# Patient Record
Sex: Female | Born: 1990 | Race: White | Hispanic: No | Marital: Single | Smoking: Never smoker
Health system: Southern US, Community
[De-identification: ages and names within clinical notes are randomized; demographics above are authoritative.]

## PROBLEM LIST (undated history)

## (undated) ENCOUNTER — Ambulatory Visit (HOSPITAL_COMMUNITY): Admission: EM | Payer: Self-pay | Source: Home / Self Care

## (undated) DIAGNOSIS — G8929 Other chronic pain: Secondary | ICD-10-CM

## (undated) DIAGNOSIS — M4310 Spondylolisthesis, site unspecified: Secondary | ICD-10-CM

## (undated) DIAGNOSIS — N83209 Unspecified ovarian cyst, unspecified side: Secondary | ICD-10-CM

## (undated) DIAGNOSIS — M5441 Lumbago with sciatica, right side: Secondary | ICD-10-CM

## (undated) HISTORY — DX: Lumbago with sciatica, right side: M54.41

## (undated) HISTORY — DX: Spondylolisthesis, site unspecified: M43.10

## (undated) HISTORY — DX: Other chronic pain: G89.29

## (undated) HISTORY — DX: Unspecified ovarian cyst, unspecified side: N83.209

## (undated) NOTE — *Deleted (*Deleted)
Integrated Behavioral Health Initial Visit  MRN: [id number] Name: Wanda Crane  Number of Integrated Behavioral Health Clinician visits:: {IBH Number of Visits:21014052} Session Start time: ***  Session End time: *** Total time: {IBH Total Time:21014050}  Type of Service: Integrated Behavioral Health- Individual/Family Interpretor:{yes [id number]} Interpretor Name and Language: ***   Warm Hand Off Completed.       SUBJECTIVE: Kasidi Shanker is a 71 y.o. female accompanied by {CHL AMB ACCOMPANIED YN:8295621308} Patient was referred by *** for ***. Patient reports the following symptoms/concerns: Shakiness, nervous, panic attacks, pt has extensive hx mh in family, irritability, no suicidal/homicidal ideations Duration of problem: ***; Severity of problem: {Mild/Moderate/Severe:20260}  OBJECTIVE: Mood: {BHH MOOD:22306} and Affect: {BHH AFFECT:22307} Risk of harm to self or others: {CHL AMB BH Suicide Current Mental Status:21022748}  LIFE CONTEXT: Family and Social: *** School/Work: *** Self-Care: *** Life Changes: ***  GOALS ADDRESSED: Patient will: 1. Reduce symptoms of: {IBH Symptoms:21014056} med management 2. Increase knowledge and/or ability of: {IBH Patient Tools:21014057} horse back riding spending time with family 3. Demonstrate ability to: {IBH Goals:21014053}  INTERVENTIONS: Interventions utilized: {IBH Interventions:21014054}  Standardized Assessments completed: {IBH Screening Tools:21014051}  ASSESSMENT: Patient currently experiencing ***.   Patient may benefit from ***.  PLAN: 1. Follow up with behavioral health clinician on : *** 2. Behavioral recommendations: *** 3. Referral(s): {IBH Referrals:21014055} 4. "From scale of 1-10, how likely are you to follow plan?": ***  Bridgett Larsson, LCSW

---

## 2010-10-24 ENCOUNTER — Emergency Department (HOSPITAL_COMMUNITY)
Admission: EM | Admit: 2010-10-24 | Discharge: 2010-10-24 | Disposition: A | Discharge status: Home / Self Care | Payer: Self-pay | Attending: Emergency Medicine | Admitting: Emergency Medicine

## 2010-10-24 DIAGNOSIS — J020 Streptococcal pharyngitis: Secondary | ICD-10-CM | POA: Insufficient documentation

## 2010-10-24 DIAGNOSIS — J3489 Other specified disorders of nose and nasal sinuses: Secondary | ICD-10-CM | POA: Insufficient documentation

## 2012-11-23 ENCOUNTER — Emergency Department (HOSPITAL_COMMUNITY)
Admission: EM | Admit: 2012-11-23 | Discharge: 2012-11-23 | Disposition: A | Discharge status: Home / Self Care | Payer: No Typology Code available for payment source | Attending: Emergency Medicine | Admitting: Emergency Medicine

## 2012-11-23 DIAGNOSIS — Z203 Contact with and (suspected) exposure to rabies: Secondary | ICD-10-CM

## 2012-11-23 DIAGNOSIS — S81852A Open bite, left lower leg, initial encounter: Secondary | ICD-10-CM

## 2012-11-23 DIAGNOSIS — Y929 Unspecified place or not applicable: Secondary | ICD-10-CM | POA: Insufficient documentation

## 2012-11-23 DIAGNOSIS — S81009A Unspecified open wound, unspecified knee, initial encounter: Secondary | ICD-10-CM | POA: Insufficient documentation

## 2012-11-23 DIAGNOSIS — S91009A Unspecified open wound, unspecified ankle, initial encounter: Secondary | ICD-10-CM | POA: Insufficient documentation

## 2012-11-23 DIAGNOSIS — W540XXA Bitten by dog, initial encounter: Secondary | ICD-10-CM | POA: Insufficient documentation

## 2012-11-23 DIAGNOSIS — Y939 Activity, unspecified: Secondary | ICD-10-CM | POA: Insufficient documentation

## 2012-11-23 MED ORDER — DOXYCYCLINE HYCLATE 100 MG PO TABS
100.0000 mg | ORAL_TABLET | Freq: Once | ORAL | Status: AC
  Administered 2012-11-23: 100 mg via ORAL
  Filled 2012-11-23: qty 1

## 2012-11-23 MED ORDER — TETANUS-DIPHTHERIA TOXOIDS TD 5-2 LFU IM INJ
0.5000 mL | INJECTION | Freq: Once | INTRAMUSCULAR | Status: AC
  Administered 2012-11-23: 0.5 mL via INTRAMUSCULAR
  Filled 2012-11-23 (×2): qty 0.5

## 2012-11-23 MED ORDER — RABIES VACCINE, PCEC IM SUSR
1.0000 mL | Freq: Once | INTRAMUSCULAR | Status: AC
  Administered 2012-11-23: 1 mL via INTRAMUSCULAR
  Filled 2012-11-23: qty 1

## 2012-11-23 MED ORDER — DOXYCYCLINE HYCLATE 100 MG PO TABS: 100.0000 mg | ORAL_TABLET | Freq: Two times a day (BID) | ORAL | Status: DC

## 2012-11-23 MED ORDER — RABIES IMMUNE GLOBULIN 150 UNIT/ML IM INJ
20.0000 [IU]/kg | INJECTION | Freq: Once | INTRAMUSCULAR | Status: AC
  Administered 2012-11-23: 1575 [IU] via INTRAMUSCULAR
  Filled 2012-11-23: qty 10.5

## 2012-11-23 MED ORDER — TRAMADOL HCL 50 MG PO TABS: 50.0000 mg | ORAL_TABLET | Freq: Four times a day (QID) | ORAL | Status: DC | PRN

## 2012-11-23 NOTE — ED Provider Notes (Signed)
History     CSN: [id number]  Arrival date & time 11/23/12  2010   First MD Initiated Contact with Patient 11/23/12 2051      Chief Complaint  Patient presents with  . Animal Bite    (Consider location/radiation/quality/duration/timing/severity/associated sxs/prior treatment) HPI Comments: Patient states she was bitten in the posterior left calf.  Last night by an unknown dog that has been found and is in custody of the animal control at this time is unknown.  If she has rabies immunization or not.  Patient states, that the wound bled last night, slightly.  Vision is tetanus status is unknown  Patient is a 22 y.o. female presenting with animal bite. The history is provided by the patient.  Animal Bite  The incident occurred yesterday. There is an injury to the left lower leg. The pain is mild. It is unlikely that a foreign body is present.    No past medical history on file.  No past surgical history on file.  No family history on file.  History  Substance Use Topics  . Smoking status: Not on file  . Smokeless tobacco: Not on file  . Alcohol Use: Not on file    OB History   No data available      Review of Systems  Constitutional: Negative for fever and chills.  Skin: Positive for wound.  All other systems reviewed and are negative.    Allergies  Penicillins  Home Medications   Current Outpatient Rx  Name  Route  Sig  Dispense  Refill  . doxycycline (VIBRA-TABS) 100 MG tablet   Oral   Take 1 tablet (100 mg total) by mouth 2 (two) times daily.   13 tablet   0   . traMADol (ULTRAM) 50 MG tablet   Oral   Take 1 tablet (50 mg total) by mouth every 6 (six) hours as needed for pain.   30 tablet   0     BP 130/67  Pulse 93  Temp(Src) 98.6 F (37 C) (Oral)  Resp 18  Wt 176 lb 3 oz (79.918 kg)  SpO2 100%  Physical Exam  Nursing note and vitals reviewed. Constitutional: She appears well-developed and well-nourished.  Obese  HENT:  Head:  Normocephalic.  Eyes: Pupils are equal, round, and reactive to light.  Neck: Normal range of motion.  Cardiovascular: Normal rate.   Pulmonary/Chest: Effort normal.  Musculoskeletal: She exhibits no edema and no tenderness.  Skin: Skin is warm. No erythema.  There is a bite mark to the posterior left calf, which appears to be more abrasions.  No punctures are visible.  There is no surrounding bruising    ED Course  Procedures (including critical care time)  Labs Reviewed - No data to display No results found.   1. Animal bite of lower leg, left, initial encounter   2. Rabies exposure       MDM   Patient will be updated on her tetanus rabies series.  Will be started.  She will also be started on doxycycline do, to her penicillin allergy        Arman Filter, NP 11/23/12 2149

## 2012-11-23 NOTE — ED Notes (Signed)
Pt c/o dog bite on posterior left calf.

## 2012-11-23 NOTE — ED Notes (Signed)
Stressed importance of follow up with [facility] re next vaccine doses. Pt voiced understanding. Copies of schedules faxed to pharmacy and [facility]

## 2012-11-23 NOTE — ED Notes (Addendum)
Pt c/o dog bite in left calf last night. Area appears to be mild abrasion. Mild redness noted, no swelling noted. Skin is not hot to touch. Pt states "I feel like it got a fever in it and I been vomiting and shaking sometimes." Pt states dog is unknown to her.

## 2012-11-26 ENCOUNTER — Emergency Department (HOSPITAL_COMMUNITY)
Admission: EM | Admit: 2012-11-26 | Discharge: 2012-11-26 | Disposition: A | Discharge status: Home / Self Care | Payer: Self-pay | Source: Home / Self Care

## 2012-11-26 ENCOUNTER — Encounter (HOSPITAL_COMMUNITY): Payer: Self-pay | Admitting: *Deleted

## 2012-11-26 MED ORDER — RABIES VACCINE, PCEC IM SUSR
INTRAMUSCULAR | Status: AC
  Filled 2012-11-26: qty 1

## 2012-11-26 MED ORDER — RABIES VACCINE, PCEC IM SUSR
1.0000 mL | Freq: Once | INTRAMUSCULAR | Status: AC
  Administered 2012-11-26: 1 mL via INTRAMUSCULAR

## 2012-11-26 NOTE — ED Notes (Signed)
Pt  Here  For  Day  3   Rabies  Injection   Voices  No  Complaints  Other than the  Bite  itcing       She  Is  Taking  Her  meds

## 2012-11-26 NOTE — ED Provider Notes (Signed)
Medical screening examination/treatment/procedure(s) were performed by non-physician practitioner and as supervising physician I was immediately available for consultation/collaboration.   Kery Haltiwanger E Inga Noller, MD 11/26/12 0932 

## 2012-11-30 ENCOUNTER — Emergency Department (INDEPENDENT_AMBULATORY_CARE_PROVIDER_SITE_OTHER)
Admission: EM | Admit: 2012-11-30 | Discharge: 2012-11-30 | Disposition: A | Discharge status: Home / Self Care | Payer: Self-pay | Source: Home / Self Care

## 2012-11-30 ENCOUNTER — Encounter (HOSPITAL_COMMUNITY): Payer: Self-pay | Admitting: Emergency Medicine

## 2012-11-30 DIAGNOSIS — Z203 Contact with and (suspected) exposure to rabies: Secondary | ICD-10-CM

## 2012-11-30 MED ORDER — RABIES VACCINE, PCEC IM SUSR
INTRAMUSCULAR | Status: AC
  Filled 2012-11-30: qty 1

## 2012-11-30 MED ORDER — RABIES VACCINE, PCEC IM SUSR
1.0000 mL | Freq: Once | INTRAMUSCULAR | Status: AC
  Administered 2012-11-30: 1 mL via INTRAMUSCULAR

## 2012-11-30 NOTE — ED Notes (Signed)
Pt states having a hard time sleeping due dog attack. Pt was informed of adult clinic and states that she will have a visit set up for primary care.  Voices no other concerns.

## 2012-12-07 ENCOUNTER — Encounter (HOSPITAL_COMMUNITY): Payer: Self-pay | Admitting: Emergency Medicine

## 2012-12-07 ENCOUNTER — Emergency Department (INDEPENDENT_AMBULATORY_CARE_PROVIDER_SITE_OTHER)
Admission: EM | Admit: 2012-12-07 | Discharge: 2012-12-07 | Disposition: A | Discharge status: Home / Self Care | Payer: Self-pay | Source: Home / Self Care

## 2012-12-07 DIAGNOSIS — Z203 Contact with and (suspected) exposure to rabies: Secondary | ICD-10-CM

## 2012-12-07 MED ORDER — RABIES VACCINE, PCEC IM SUSR
1.0000 mL | Freq: Once | INTRAMUSCULAR | Status: AC
  Administered 2012-12-07: 1 mL via INTRAMUSCULAR

## 2012-12-07 MED ORDER — RABIES VACCINE, PCEC IM SUSR
INTRAMUSCULAR | Status: AC
  Filled 2012-12-07: qty 1

## 2012-12-07 NOTE — ED Notes (Signed)
Patient in department for final rabies injection.

## 2013-11-07 ENCOUNTER — Encounter (HOSPITAL_COMMUNITY): Payer: Self-pay | Admitting: Emergency Medicine

## 2013-11-07 ENCOUNTER — Emergency Department (HOSPITAL_COMMUNITY)
Admission: EM | Admit: 2013-11-07 | Discharge: 2013-11-07 | Disposition: A | Discharge status: Home / Self Care | Payer: No Typology Code available for payment source | Attending: Emergency Medicine | Admitting: Emergency Medicine

## 2013-11-07 DIAGNOSIS — G8929 Other chronic pain: Secondary | ICD-10-CM | POA: Insufficient documentation

## 2013-11-07 DIAGNOSIS — J302 Other seasonal allergic rhinitis: Secondary | ICD-10-CM

## 2013-11-07 DIAGNOSIS — M549 Dorsalgia, unspecified: Secondary | ICD-10-CM

## 2013-11-07 DIAGNOSIS — M545 Low back pain, unspecified: Secondary | ICD-10-CM | POA: Insufficient documentation

## 2013-11-07 DIAGNOSIS — J309 Allergic rhinitis, unspecified: Secondary | ICD-10-CM | POA: Insufficient documentation

## 2013-11-07 DIAGNOSIS — Z880 Allergy status to penicillin: Secondary | ICD-10-CM | POA: Insufficient documentation

## 2013-11-07 LAB — RAPID STREP SCREEN (MED CTR MEBANE ONLY): Streptococcus, Group A Screen (Direct): NEGATIVE

## 2013-11-07 MED ORDER — NAPROXEN 500 MG PO TABS: 500.0000 mg | ORAL_TABLET | Freq: Two times a day (BID) | ORAL | Status: DC

## 2013-11-07 MED ORDER — LORATADINE-PSEUDOEPHEDRINE ER 5-120 MG PO TB12: 1.0000 | ORAL_TABLET | Freq: Two times a day (BID) | ORAL | Status: DC

## 2013-11-07 NOTE — ED Notes (Signed)
PT comfortable with discharge and follow up instructions. Prescriptions x2. 

## 2013-11-07 NOTE — Discharge Instructions (Signed)
Chronic Back Pain  When back pain lasts longer than 3 months, it is called chronic back pain.People with chronic back pain often go through certain periods that are more intense (flare-ups).  CAUSES Chronic back pain can be caused by wear and tear (degeneration) on different structures in your back. These structures include:  The bones of your spine (vertebrae) and the joints surrounding your spinal cord and nerve roots (facets).  The strong, fibrous tissues that connect your vertebrae (ligaments). Degeneration of these structures may result in pressure on your nerves. This can lead to constant pain. HOME CARE INSTRUCTIONS  Avoid bending, heavy lifting, prolonged sitting, and activities which make the problem worse.  Take brief periods of rest throughout the day to reduce your pain. Lying down or standing usually is better than sitting while you are resting.  Take over-the-counter or prescription medicines only as directed by your caregiver. SEEK IMMEDIATE MEDICAL CARE IF:   You have weakness or numbness in one of your legs or feet.  You have trouble controlling your bladder or bowels.  You have nausea, vomiting, abdominal pain, shortness of breath, or fainting. Document Released: 08/31/2004 Document Revised: 10/16/2011 Document Reviewed: 07/08/2011 [facility] Patient Information 2014 [place], [place].  Hay Fever Hay fever is an allergic reaction to particles in the air. It cannot be passed from person to person. It cannot be cured, but it can be controlled. CAUSES  Hay fever is caused by something that triggers an allergic reaction (allergens). The following are examples of allergens:  Ragweed.  Feathers.  Animal dander.  Grass and tree pollens.  Cigarette smoke.  House dust.  Pollution. SYMPTOMS   Sneezing.  Runny or stuffy nose.  Tearing eyes.  Itchy eyes, nose, mouth, throat, skin, or other area.  Sore throat.  Headache.  Decreased sense of smell or  taste. DIAGNOSIS Your caregiver will perform a physical exam and ask questions about the symptoms you are having.Allergy testing may be done to determine exactly what triggers your hay fever.  TREATMENT   Over-the-counter medicines may help symptoms. These include:  Antihistamines.  Decongestants. These may help with nasal congestion.  Your caregiver may prescribe medicines if over-the-counter medicines do not work.  Some people benefit from allergy shots when other medicines are not helpful. HOME CARE INSTRUCTIONS   Avoid the allergen that is causing your symptoms, if possible.  Take all medicine as told by your caregiver. SEEK MEDICAL CARE IF:   You have severe allergy symptoms and your current medicines are not helping.  Your treatment was working at one time, but you are now experiencing symptoms.  You have sinus congestion and pressure.  You develop a fever or headache.  You have thick nasal discharge.  You have asthma and have a worsening cough and wheezing. SEEK IMMEDIATE MEDICAL CARE IF:   You have swelling of your tongue or lips.  You have trouble breathing.  You feel lightheaded or like you are going to faint.  You have cold sweats.  You have a fever. Document Released: 07/24/2005 Document Revised: 10/16/2011 Document Reviewed: 10/19/2010 [facility] Patient Information 2014 [place].

## 2013-11-07 NOTE — ED Notes (Signed)
The pt has had a headache and sore throat for 3 days with a cough  No temp

## 2013-11-07 NOTE — ED Notes (Addendum)
The weight and vitals are not his pts unable to remover them

## 2013-11-07 NOTE — ED Provider Notes (Signed)
CSN: [id number]     Arrival date & time 11/07/13  1643 History   First MD Initiated Contact with Patient 11/07/13 1935     Chief Complaint  Patient presents with  . Headache     (Consider location/radiation/quality/duration/timing/severity/associated sxs/prior Treatment) Patient is a 23 y.o. female presenting with headaches.  Headache  Pt with no significant PMH reports 2-3 days of diffuse throbbing headache, itchy watery eyes, nasal congestion and scratchy throat. No fever. No vomiting. No cough or SOB. She has a secondary complaint of one year of low back pain. States she has been to the ER several times for same.   History reviewed. No pertinent past medical history. History reviewed. No pertinent past surgical history. No family history on file. History  Substance Use Topics  . Smoking status: Never Smoker   . Smokeless tobacco: Not on file  . Alcohol Use: No   OB History   Grav Para Term Preterm Abortions TAB SAB Ect Mult Living                 Review of Systems  Neurological: Positive for headaches.    All other systems reviewed and are negative except as noted in HPI.    Allergies  Penicillins  Home Medications   Current Outpatient Rx  Name  Route  Sig  Dispense  Refill  . OVER THE COUNTER MEDICATION   Oral   Take 1 capsule by mouth once. Z-quill OTC          BP 111/68  Pulse 108  Temp(Src) 98.4 F (36.9 C) (Tympanic)  Resp 20  Ht 5\' 3"  (1.6 m)  Wt 183 lb 9 oz (83.263 kg)  BMI 32.52 kg/m2  SpO2 98%  LMP 10/07/2013 Physical Exam  Nursing note and vitals reviewed. Constitutional: She is oriented to person, place, and time. She appears well-developed and well-nourished.  HENT:  Head: Normocephalic and atraumatic.  Eyes: EOM are normal. Pupils are equal, round, and reactive to light.  Neck: Normal range of motion. Neck supple.  Cardiovascular: Normal rate, normal heart sounds and intact distal pulses.   Pulmonary/Chest: Effort normal and breath  sounds normal.  Abdominal: Bowel sounds are normal. She exhibits no distension. There is no tenderness.  Musculoskeletal: Normal range of motion. She exhibits no edema and no tenderness.  Neurological: She is alert and oriented to person, place, and time. She has normal strength. No cranial nerve deficit or sensory deficit.  Skin: Skin is warm and dry. No rash noted.  Psychiatric: She has a normal mood and affect.    ED Course  Procedures (including critical care time) Labs Review Labs Reviewed  RAPID STREP SCREEN  CULTURE, GROUP A STREP   Imaging Review No results found.   EKG Interpretation None      MDM   Final diagnoses:  Seasonal allergies  Chronic back pain        Charles B. Karle Starch, MD 11/07/13 1945

## 2013-11-07 NOTE — ED Notes (Signed)
All vitals and vital signs documented by me.  (they are correct)

## 2013-11-09 LAB — CULTURE, GROUP A STREP

## 2014-05-25 ENCOUNTER — Encounter (HOSPITAL_COMMUNITY): Payer: Self-pay | Admitting: Emergency Medicine

## 2014-05-25 ENCOUNTER — Emergency Department (HOSPITAL_COMMUNITY): Payer: Self-pay

## 2014-05-25 ENCOUNTER — Emergency Department (HOSPITAL_COMMUNITY)
Admission: EM | Admit: 2014-05-25 | Discharge: 2014-05-25 | Disposition: A | Discharge status: Home / Self Care | Payer: Self-pay | Attending: Emergency Medicine | Admitting: Emergency Medicine

## 2014-05-25 DIAGNOSIS — J069 Acute upper respiratory infection, unspecified: Secondary | ICD-10-CM | POA: Insufficient documentation

## 2014-05-25 DIAGNOSIS — Z880 Allergy status to penicillin: Secondary | ICD-10-CM | POA: Insufficient documentation

## 2014-05-25 DIAGNOSIS — Z3202 Encounter for pregnancy test, result negative: Secondary | ICD-10-CM | POA: Insufficient documentation

## 2014-05-25 LAB — URINE MICROSCOPIC-ADD ON

## 2014-05-25 LAB — URINALYSIS, ROUTINE W REFLEX MICROSCOPIC
BILIRUBIN URINE: NEGATIVE
Glucose, UA: NEGATIVE mg/dL
HGB URINE DIPSTICK: NEGATIVE
Ketones, ur: NEGATIVE mg/dL
NITRITE: NEGATIVE
PROTEIN: NEGATIVE mg/dL
SPECIFIC GRAVITY, URINE: 1.028 (ref 1.005–1.030)
UROBILINOGEN UA: 0.2 mg/dL (ref 0.0–1.0)
pH: 6.5 (ref 5.0–8.0)

## 2014-05-25 LAB — POC URINE PREG, ED: Preg Test, Ur: NEGATIVE

## 2014-05-25 MED ORDER — ALBUTEROL SULFATE HFA 108 (90 BASE) MCG/ACT IN AERS: 2.0000 | INHALATION_SPRAY | RESPIRATORY_TRACT | Status: DC | PRN

## 2014-05-25 NOTE — ED Notes (Signed)
PT st';s she has had a cough x's 2 days and now has a migraine headache.  Has taken OTC meds without relief.  Pt also st's her chest feels tight from coughing so much

## 2014-05-25 NOTE — ED Provider Notes (Signed)
CSN: [id number]     Arrival date & time 05/25/14  2008 History   First MD Initiated Contact with Patient 05/25/14 2149     Chief Complaint  Patient presents with  . Cough     (Consider location/radiation/quality/duration/timing/severity/associated sxs/prior Treatment) HPI 23 year old female nonsmoker no history of asthma complains of 2 weeks of a cough nonproductive no fever no confusion no rash no vomiting no diarrhea no body aches no chest pain except when she coughs; she did develop a headache tonight when she was coughing the headache started mildly gradually became worse became moderately severe and is now almost gone his only mild and now with no sudden onset no confusion no change in speech no change in vision no change in swallowing or understanding or lateralizing weakness numbness or incoordination. There is no treatment prior to arrival. History reviewed. No pertinent past medical history. History reviewed. No pertinent past surgical history. No family history on file. History  Substance Use Topics  . Smoking status: Never Smoker   . Smokeless tobacco: Not on file  . Alcohol Use: No   OB History    No data available     Review of Systems 10 Systems reviewed and are negative for acute change except as noted in the HPI.   Allergies  Penicillins  Home Medications   Prior to Admission medications   Medication Sig Start Date End Date Taking? Authorizing Provider  Pseudoephedrine-DM-GG (TUSSIN COLD/COUGH PO) Take 10 mLs by mouth every 6 (six) hours as needed (cough).   Yes Historical Provider, MD  albuterol (PROVENTIL HFA;VENTOLIN HFA) 108 (90 BASE) MCG/ACT inhaler Inhale 2 puffs into the lungs every 2 (two) hours as needed for wheezing or shortness of breath (cough). 05/25/14   Babette Relic, MD   BP 102/51 mmHg  Pulse 83  Temp(Src) 98.4 F (36.9 C) (Oral)  Resp 23  Ht 5\' 3"  (1.6 m)  Wt 189 lb 7 oz (85.928 kg)  BMI 33.57 kg/m2  SpO2 98%  LMP 04/16/2014 Physical  Exam  Nursing note and vitals reviewed. Constitutional:  Awake, alert, nontoxic appearance with baseline speech for patient.  HENT:  Head: Atraumatic.  Mouth/Throat: No oropharyngeal exudate.  Eyes: EOM are normal. Pupils are equal, round, and reactive to light. Right eye exhibits no discharge. Left eye exhibits no discharge.  Neck: Neck supple.  Cardiovascular: Normal rate and regular rhythm.   No murmur heard. Pulmonary/Chest: Effort normal and breath sounds normal. No stridor. No respiratory distress. She has no wheezes. She has no rales. She exhibits no tenderness.  Pulse oximetry normal room air 99%; speech is normal  Abdominal: Soft. Bowel sounds are normal. She exhibits no mass. There is no tenderness. There is no rebound.  Musculoskeletal: She exhibits no tenderness.  Baseline ROM, moves extremities with no obvious new focal weakness.  Lymphadenopathy:    She has no cervical adenopathy.  Neurological: She is alert.  Awake, alert, cooperative and aware of situation; motor strength bilaterally; sensation normal to light touch bilaterally; peripheral visual fields full to confrontation; no facial asymmetry; tongue midline; major cranial nerves appear intact; no pronator drift, normal finger to nose bilaterally, baseline gait without new ataxia.  Skin: No rash noted.  Psychiatric: She has a normal mood and affect.    ED Course  Procedures (including critical care time) Labs Review Labs Reviewed  URINALYSIS, ROUTINE W REFLEX MICROSCOPIC - Abnormal; Notable for the following:    APPearance CLOUDY (*)    Leukocytes, UA MODERATE (*)  All other components within normal limits  URINE MICROSCOPIC-ADD ON - Abnormal; Notable for the following:    Squamous Epithelial / LPF FEW (*)    Bacteria, UA FEW (*)    All other components within normal limits  POC URINE PREG, ED    Imaging Review No results found.   EKG Interpretation None      MDM   Final diagnoses:  URI (upper  respiratory infection)    Patient informed of clinical course, understand medical decision-making process, and agree with plan. I doubt any other EMC precluding discharge at this time including, but not necessarily limited to the following:SAH, CVA, SBI.   Babette Relic, MD 06/07/14 2017

## 2014-05-25 NOTE — Discharge Instructions (Signed)
You appear to have an upper respiratory infection (URI). An upper respiratory tract infection, or cold, is a viral infection of the air passages leading to the lungs. It is contagious and can be spread to others, especially during the first 3 or 4 days. It cannot be cured by antibiotics or other medicines. °RETURN IMMEDIATELY IF you develop shortness of breath, confusion or altered mental status, a new rash, become dizzy, faint, or poorly responsive, or are unable to be cared for at home.  Emergency Department Resource Guide °1) Find a Doctor and Pay Out of Pocket °Although you won't have to find out who is covered by your insurance plan, it is a good idea to ask around and get recommendations. You will then need to call the office and see if the doctor you have chosen will accept you as a new patient and what types of options they offer for patients who are self-pay. Some doctors offer discounts or will set up payment plans for their patients who do not have insurance, but you will need to ask so you aren't surprised when you get to your appointment. ° °2) Contact Your Local Health Department °Not all health departments have doctors that can see patients for sick visits, but many do, so it is worth a call to see if yours does. If you don't know where your local health department is, you can check in your phone book. The CDC also has a tool to help you locate your state's health department, and many state websites also have listings of all of their local health departments. ° °3) Find a Walk-in Clinic °If your illness is not likely to be very severe or complicated, you may want to try a walk in clinic. These are popping up all over the country in pharmacies, drugstores, and shopping centers. They're usually staffed by nurse practitioners or physician assistants that have been trained to treat common illnesses and complaints. They're usually fairly quick and inexpensive. However, if you have serious medical issues or  chronic medical problems, these are probably not your best option. ° °No Primary Care Doctor: °- Call [facility] at  832-8000 - they can help you locate a primary care doctor that  accepts your insurance, provides certain services, etc. °- Physician Referral Service- 1-800-533-3463 ° °Chronic Pain Problems: °Organization         Address  Phone   Notes  °[facility]  (336) 297-2271 Patients need to be referred by their primary care doctor.  ° °Medication Assistance: °Organization         Address  Phone   Notes  °[place] [facility] [address] °[place], [place] [address] (336) 641-8030 --Must be a resident of [place] °-- Must have NO insurance coverage whatsoever (no Medicaid/ Medicare, etc.) °-- The pt. MUST have a primary care doctor that directs their care regularly and follows them in the community °  °[facility]  (866) 331-1348   °[employer]  (888) 892-1162   ° °Agencies that provide inexpensive medical care: °Organization         Address  Phone   Notes  °[facility]  (336) 832-8035   °[facility]    (336) 832-7272   °[facility] [address] °[place], [place] [address] (336) 832-4777   °[facility] [address], °[place] (336) 271-4999   °[facility]    (336) 373-0678   °  [facility]    (336) 272-1050   °[facility] ° [address], [place] Phone:  (336) 832-4444, Fax:  (336) 832-4440 Hours of Operation:  9 am - 6 pm, M-F.  Also accepts Medicaid/Medicare and self-pay.  °[facility] ° [address], [place] Phone: (336) 832-3150, Fax: (336) 832-3151. Hours of Operation:  8:30 am - 5:30 pm, M-F.  Also accepts Medicaid and self-pay.  °[facility] [place] [address], [place] Phone: (336) 878-6027   °[facility] [address], [place], [place]  (336)723-1848, Ext. 123 Mondays & Thursdays: 7-9 AM.  First 15 patients are seen on a first come, first serve basis. °  ° °Medicaid-accepting [place] Providers: ° °Organization         Address  Phone   Notes  °[facility] [address], [place] (336) 641-2100 Also accepts self-pay patients.  °[facility] [address], [place] ° (336) 856-9996   °[facility] [address], [place] (336) 288-8857   °[facility] [address] [place] [address], [place] (336) 299-7000   °Veita Bland [address], [place]  ° (336) 373-1557 Only accepts [place] Access Medicaid patients after they have their name applied to their card.  ° °Self-Pay (no insurance) in [place]: ° °Organization         Address  Phone   Notes  °Sickle Cell Patients, [facility] [address], [place] (336) 832-1970   °[facility] [address], [place] (336) 832-4400   °[facility] [place] ° [address], [place] (336) 992-4800   °[facility]/Dr. Osei-Bonsu ° [address] [place] [address], [place] or [address], [place] (336) 841-8500 Phone number for both [place] and [place] locations is the same.  °[facility] [address], [place] (336) 299-0000   °[facility] [address] [place] [address], [place] or [address] (336) 852-7530 °(336) 878-2260   °[facility] [address],  (336) 350-1642, phone; (336) 294-5005, fax Sees patients 1st and 3rd Saturday of every month.  Must not qualify for public or private insurance (i.e. Medicaid, Medicare, [place] Health Choice, Veterans' Benefits) • Household income should be no more than 200% of the poverty level •The clinic cannot treat you if you are pregnant or think you are pregnant • Sexually transmitted  diseases are not treated at the clinic.  ° °Dental Care: °Organization         Address  Phone  Notes  °[facility] [address],  (336) 641-6152 Accepts children up to age 21 who are enrolled in Medicaid or [place] Health Choice; pregnant women with a Medicaid card; and children who have applied for Medicaid or [place] Health Choice, but were declined, whose parents can pay a reduced fee at time of service.  °[facility] [place]  [address], [place] (336) 641-7733 Accepts children up to age 21 who are enrolled in Medicaid or [place] Health Choice; pregnant women with a Medicaid card; and children who have applied for Medicaid or [place] Health Choice, but were declined, whose parents can pay a reduced fee at time of   service.  °[facility] ° [address], [place] (336) 641-4533 Patients are seen by appointment only. Walk-ins are not accepted. [facility] will see patients 18 years of age and older. °Monday - Tuesday (8am-5pm) °Most Wednesdays (8:30-5pm) °$30 per visit, cash only  °[facility] ° [address], [place] (336) 641-4533 Patients are seen by appointment only. Walk-ins are not accepted. [facility] will see patients 18 years of age and older. °One Wednesday Evening (Monthly: Volunteer Based).  $30 per visit, cash only  °[facility]  (919) 537-3737 for adults; Children under age 4, call Graduate Pediatric Dentistry at (919) [phone]. Children aged 4-14, please call (919) [phone] to request a pediatric application. ° Dental services are provided in all areas of dental care including fillings, crowns and bridges, complete and partial dentures, implants, gum treatment, root canals, and extractions. Preventive care is also provided. Treatment is provided to both adults and children. °Patients are selected via a  lottery and there is often a waiting list. °  °[facility] [address], °[place] ° (336) 763-8833 [email or web address] °  °[facility] [address], [place], [place] (336)723-1848, Ext. 123 Second and Fourth Thursday of each month, opens at 6:30 AM; Clinic ends at 9 AM.  Patients are seen on a first-come first-served basis, and a limited number are seen during each clinic.  ° °[facility] ° [address], [place], [place] (336) 723-7904   Eligibility Requirements °You must have lived in [place], [place], or [place] counties for at least the last three months. °  You cannot be eligible for state or federal sponsored healthcare insurance, including Veterans Administration, Medicaid, or Medicare. °  You generally cannot be eligible for healthcare insurance through your employer.  °  How to apply: °Eligibility screenings are held every Tuesday and Wednesday afternoon from 1:00 pm until 4:00 pm. You do not need an appointment for the interview!  °[facility] [address], [place], [place] 336-631-2330   °[facility]  336-342-8273   °[facility]  336-703-3100   °[facility]  336-570-6415   ° °Behavioral Health Resources in the Community: °Intensive Outpatient Programs °Organization         Address  Phone  Notes  °[facility] [address], [place], [place] 336-878-6098   °[facility] [address], [place], [place] 336-832-9800   °[facility] [address], [place], [place] ° 336-882-2125   °[facility] [address],  °[place],  1-800-853-5163 or 336-641-4981   °Substance Abuse Resources °Organization         Address  Phone  Notes  °[facility]  336-882-2125   °[facility]  336-784-9470   °[facility]  336-285-9073   °[facility]  336-845-3988   °Residential &  Outpatient Substance Abuse Program  1-800-659-3381   °Psychological Services °Organization         Address  Phone  Notes  °[facility]  336- 832-9600   °[facility]  336- 378-7881   °[facility] [address], [place] 1-800-853-5163 or 336-641-4981   ° °Mobile Crisis Teams °Organization         Address  Phone  Notes  °[facility], Mobile Crisis Care Unit  1-877-626-1772   °Assertive °Psychotherapeutic Services °   [address] [place], [place] 336-834-9664   °Sharon DeEsch [address] °[place] 336-554-5454   ° °Self-Help/Support Groups °Organization         Address  Phone             Notes  °[facility] - variety of support groups  336- 373-1402 Call for more information  °Narcotics Anonymous (NA), [facility] [address], °[place]  2 meetings at this location  ° °Residential Treatment Programs °Organization         Address  Phone  Notes  °[facility] [address],    °[place]  1-866-801-8205   °[facility] ° [address], [place], [place] 704-293-8524   °[facility] [address], [place] 336-845-3988 Admissions: 8am-3pm M-F  °[facility] [address]    °[place], [place] 336-841-1104   °[facility] [address], [place], [place] 336-379-7146   °[facility] [address]  °[place], [place] 336-285-9073   °[facility] - Intensive Outpatient [address], [place], [place] 336-852-3033   °[facility] [address]  °[place], [place] 1-877-615-2722 or 336-784-9470   °[facility] [address] [place], [place] 336-227-7417 Accepts Medicaid  °[facility] [address]  °[place] 1-800-659-3381 Substance Abuse/Addiction Treatment  ° °[place] Behavioral Health Resources °Organization          Address  Phone  Notes  °[facility]  (888) 581-9988   °Julie Brannon, PhD [address] [place], [place]   (336) 349-5553 or (336) 951-0000   °[facility]   [address] °[place], [place] (336) 349-4454   °[facility] [address], [place], [place] (336) 342-8316 Insurance/Medicaid/sponsorship through [facility]  °[facility] [address]                                    [place], [place] (336) 342-8316 Therapy/tele-psych/case  °[facility] [address]  ° [place], [place] (336) 349-2233    °Dr. Arfeen  (336) 349-4544   °[facility] [place]  [employer] [place] [facility] 1) [address], [place] °2) [address], [place] °3)  [address], [place] (336) 349-3220 °(336) 342-7768 ° °(336) 342-8140   °[place] [facility] (336) 342-1394 or (336) 342-3537 (After Hours)    ° °   °

## 2014-08-14 ENCOUNTER — Emergency Department (HOSPITAL_COMMUNITY): Payer: Self-pay

## 2014-08-14 ENCOUNTER — Encounter (HOSPITAL_COMMUNITY): Payer: Self-pay | Admitting: Emergency Medicine

## 2014-08-14 ENCOUNTER — Emergency Department (HOSPITAL_COMMUNITY)
Admission: EM | Admit: 2014-08-14 | Discharge: 2014-08-14 | Disposition: A | Discharge status: Home / Self Care | Payer: Self-pay | Attending: Emergency Medicine | Admitting: Emergency Medicine

## 2014-08-14 DIAGNOSIS — Z79899 Other long term (current) drug therapy: Secondary | ICD-10-CM | POA: Insufficient documentation

## 2014-08-14 DIAGNOSIS — M5442 Lumbago with sciatica, left side: Secondary | ICD-10-CM | POA: Insufficient documentation

## 2014-08-14 DIAGNOSIS — R350 Frequency of micturition: Secondary | ICD-10-CM | POA: Insufficient documentation

## 2014-08-14 DIAGNOSIS — Z880 Allergy status to penicillin: Secondary | ICD-10-CM | POA: Insufficient documentation

## 2014-08-14 DIAGNOSIS — M549 Dorsalgia, unspecified: Secondary | ICD-10-CM

## 2014-08-14 DIAGNOSIS — Z3202 Encounter for pregnancy test, result negative: Secondary | ICD-10-CM | POA: Insufficient documentation

## 2014-08-14 LAB — POC URINE PREG, ED: PREG TEST UR: NEGATIVE

## 2014-08-14 LAB — URINALYSIS, ROUTINE W REFLEX MICROSCOPIC
BILIRUBIN URINE: NEGATIVE
Glucose, UA: NEGATIVE mg/dL
Hgb urine dipstick: NEGATIVE
Ketones, ur: NEGATIVE mg/dL
Nitrite: NEGATIVE
PH: 5 (ref 5.0–8.0)
Protein, ur: NEGATIVE mg/dL
SPECIFIC GRAVITY, URINE: 1.025 (ref 1.005–1.030)
UROBILINOGEN UA: 0.2 mg/dL (ref 0.0–1.0)

## 2014-08-14 LAB — URINE MICROSCOPIC-ADD ON

## 2014-08-14 MED ORDER — HYDROCODONE-ACETAMINOPHEN 5-325 MG PO TABS
1.0000 | ORAL_TABLET | Freq: Once | ORAL | Status: AC
  Administered 2014-08-14: 1 via ORAL
  Filled 2014-08-14: qty 1

## 2014-08-14 MED ORDER — DIAZEPAM 5 MG PO TABS
5.0000 mg | ORAL_TABLET | Freq: Once | ORAL | Status: AC
  Administered 2014-08-14: 5 mg via ORAL
  Filled 2014-08-14: qty 1

## 2014-08-14 MED ORDER — PREDNISONE 10 MG PO TABS: ORAL_TABLET | ORAL | Status: DC

## 2014-08-14 MED ORDER — DIAZEPAM 5 MG PO TABS: 5.0000 mg | ORAL_TABLET | Freq: Two times a day (BID) | ORAL | Status: DC

## 2014-08-14 MED ORDER — OXYCODONE-ACETAMINOPHEN 5-325 MG PO TABS: 2.0000 | ORAL_TABLET | ORAL | Status: DC | PRN

## 2014-08-14 NOTE — ED Notes (Signed)
Pt. reports low back pain with movement and certain positions onset 2 weeks ago , denies injury or fall , no urinary discomfort / denies fever .

## 2014-08-14 NOTE — ED Provider Notes (Signed)
CSN: [id number]     Arrival date & time 08/14/14  1909 History  This chart was scribed for Cherylann Parr, PA-C, working with [employer]. Alvino Chapel, MD by Starleen Arms, ED Scribe. This patient was seen in room TR06C/TR06C and the patient's care was started at 8:24 PM.   Chief Complaint  Patient presents with  . Back Pain   The history is provided by the patient. No language interpreter was used.   HPI Comments: Wanda Crane is a 24 y.o. female who presents to the Emergency Department complaining of bilateral lower back pain with intermittent sharp radiation down left leg onset 3-4 weeks ago.  She also reports some mild, intermittent tingling and increased frequency.  Patient reports she initially thought she strained her back due to repetitive bending and lifting at work.  She denies specific injury. Patient reports she had taken ibuprofen and aleve without relief. Patient denies history of back pain, back injury, back surgery.  Patient denies history of osteoporosis, cancer.  Patient denies IV drug use. Patient denies  numbness. Patient denies other complaints.  Patient denies tingling/numbness in groin, dysuria, urgency, bowel/bladder incontinence, fever, chills, n/v.  Patient reports an allergy to penicillin.      History reviewed. No pertinent past medical history. History reviewed. No pertinent past surgical history. No family history on file. History  Substance Use Topics  . Smoking status: Never Smoker   . Smokeless tobacco: Not on file  . Alcohol Use: No   OB History    No data available     Review of Systems  Constitutional: Negative for fever and chills.  Gastrointestinal: Negative for nausea and vomiting.  Genitourinary: Positive for frequency. Negative for dysuria and urgency.  Musculoskeletal: Positive for back pain.  Neurological: Negative for weakness and numbness.  All other systems reviewed and are negative.     Allergies  Penicillins  Home Medications    Prior to Admission medications   Medication Sig Start Date End Date Taking? Authorizing Provider  albuterol (PROVENTIL HFA;VENTOLIN HFA) 108 (90 BASE) MCG/ACT inhaler Inhale 2 puffs into the lungs every 2 (two) hours as needed for wheezing or shortness of breath (cough). 05/25/14   Babette Relic, MD  diazepam (VALIUM) 5 MG tablet Take 1 tablet (5 mg total) by mouth 2 (two) times daily. 08/14/14   Lurlean Kernen A Forcucci, PA-C  oxyCODONE-acetaminophen (PERCOCET) 5-325 MG per tablet Take 2 tablets by mouth every 4 (four) hours as needed. 08/14/14   Seleen Walter A Forcucci, PA-C  predniSONE (DELTASONE) 10 MG tablet Day 1 take 6 pills Day 2 take 5 pills Day 3 take 4 pills Day 4 take 3 pills Day 5 take 2 pills Day 6 take 1 pill 08/14/14   Yecenia Dalgleish A Forcucci, PA-C  Pseudoephedrine-DM-GG (TUSSIN COLD/COUGH PO) Take 10 mLs by mouth every 6 (six) hours as needed (cough).    Historical Provider, MD   BP 109/61 mmHg  Pulse 80  Temp(Src) 98.7 F (37.1 C) (Oral)  Resp 20  SpO2 99%  LMP 07/20/2014 Physical Exam  Constitutional: She is oriented to person, place, and time. She appears well-developed and well-nourished. No distress.  HENT:  Head: Normocephalic and atraumatic.  Nose: Nose normal.  Mouth/Throat: Oropharynx is clear and moist.  Eyes: Conjunctivae and EOM are normal. Pupils are equal, round, and reactive to light.  Neck: Normal range of motion. Neck supple. No JVD present. No thyromegaly present.  Cardiovascular: Normal rate, regular rhythm and normal heart sounds.  Exam reveals no  gallop and no friction rub.   No murmur heard. Pulmonary/Chest: Effort normal and breath sounds normal. No respiratory distress. She has no wheezes. She has no rhonchi. She has no rales. She exhibits no tenderness.  Abdominal: Soft. Bowel sounds are normal. She exhibits no distension and no mass. There is no tenderness. There is no rebound and no guarding.  Musculoskeletal: Normal range of motion.  Patient rises slowly  from sitting to standing.  They walk without an antalgic gait.  There is no evidence of erythema, ecchymosis, or gross deformity.  There is tenderness to palpation over lumbar bony spine and bilateral lumbar paraspinal muscles.  Active ROM is limited due to pain.  Sensation to light touch is intact over all extremities.  Strength is symmetric and equal in all extremities.      Lymphadenopathy:    She has no cervical adenopathy.  Neurological: She is alert and oriented to person, place, and time. She has normal strength. No cranial nerve deficit or sensory deficit.  Skin: Skin is warm and dry.  Psychiatric: She has a normal mood and affect. Her behavior is normal. Judgment and thought content normal.  Nursing note and vitals reviewed.   ED Course  Procedures (including critical care time)  DIAGNOSTIC STUDIES: Oxygen Saturation is 98% on RA, normal by my interpretation.    COORDINATION OF CARE:  8:32 PM Will order labs, imaging, and pain medication.  Patient acknowledges and agrees with plan.    Labs Review Labs Reviewed  URINALYSIS, ROUTINE W REFLEX MICROSCOPIC - Abnormal; Notable for the following:    Leukocytes, UA SMALL (*)    All other components within normal limits  URINE MICROSCOPIC-ADD ON - Abnormal; Notable for the following:    Squamous Epithelial / LPF MANY (*)    All other components within normal limits  POC URINE PREG, ED    Imaging Review Dg Lumbar Spine Complete  08/14/2014   CLINICAL DATA:  Low back pain x2 weeks, no known injury  EXAM: LUMBAR SPINE - COMPLETE 4+ VIEW  COMPARISON:  None.  FINDINGS: Five lumbar type vertebral bodies.  Normal lumbar lordosis.  No evidence of acute fracture or dislocation. Vertebral body heights and intervertebral disc spaces are maintained. Dens appears intact.  Grade 1 spondylolisthesis at L5-S1.  IMPRESSION: Grade 1 spondylolisthesis at L5-S1.  No evidence of acute fracture or dislocation.   Electronically Signed   By: Julian Hy M.D.   On: 08/14/2014 21:32     EKG Interpretation None      MDM   Final diagnoses:  Back pain  Midline low back pain with left-sided sciatica   Patient is a 24 year old female who presents emergency room for evaluation of back pain. Physical exam reveals no red flags for cauda equina at this time. There is tenderness over the lumbar spine and bony paraspinal muscles. Neck x-ray reveals spondylolisthesis at L5-S1. Possible symptoms of sciatica at this time. UA does not reveal frank infection at this time. Urine pregnancy is negative. We'll discharge home with prednisone, oxycodone, and Valium. Patient follow-up for symptoms of cauda equina which we discussed. I have encouraged the patient to follow-up with primary care doctor in 1 week if symptoms have not resolved. Patient states understanding and agreement at this time. Patient is stable for discharge.  I personally performed the services described in this documentation, which was scribed in my presence. The recorded information has been reviewed and is accurate.   Cherylann Parr, PA-C 08/14/14 2202  Jasper Riling. Alvino Chapel, MD 08/15/14 [phone]

## 2014-08-14 NOTE — Discharge Instructions (Signed)
Back Pain, Adult °Low back pain is very common. About 1 in 5 people have back pain. The cause of low back pain is rarely dangerous. The pain often gets better over time. About half of people with a sudden onset of back pain feel better in just 2 weeks. About 8 in 10 people feel better by 6 weeks.  °CAUSES °Some common causes of back pain include: °· Strain of the muscles or ligaments supporting the spine. °· Wear and tear (degeneration) of the spinal discs. °· Arthritis. °· Direct injury to the back. °DIAGNOSIS °Most of the time, the direct cause of low back pain is not known. However, back pain can be treated effectively even when the exact cause of the pain is unknown. Answering your caregiver's questions about your overall health and symptoms is one of the most accurate ways to make sure the cause of your pain is not dangerous. If your caregiver needs more information, he or she may order lab work or imaging tests (X-rays or MRIs). However, even if imaging tests show changes in your back, this usually does not require surgery. °HOME CARE INSTRUCTIONS °For many people, back pain returns. Since low back pain is rarely dangerous, it is often a condition that people can learn to manage on their own.  °· Remain active. It is stressful on the back to sit or stand in one place. Do not sit, drive, or stand in one place for more than 30 minutes at a time. Take short walks on level surfaces as soon as pain allows. Try to increase the length of time you walk each day. °· Do not stay in bed. Resting more than 1 or 2 days can delay your recovery. °· Do not avoid exercise or work. Your body is made to move. It is not dangerous to be active, even though your back may hurt. Your back will likely heal faster if you return to being active before your pain is gone. °· Pay attention to your body when you  bend and lift. Many people have less discomfort when lifting if they bend their knees, keep the load close to their bodies, and  avoid twisting. Often, the most comfortable positions are those that put less stress on your recovering back. °· Find a comfortable position to sleep. Use a firm mattress and lie on your side with your knees slightly bent. If you lie on your back, put a pillow under your knees. °· Only take over-the-counter or prescription medicines as directed by your caregiver. Over-the-counter medicines to reduce pain and inflammation are often the most helpful. Your caregiver may prescribe muscle relaxant drugs. These medicines help dull your pain so you can more quickly return to your normal activities and healthy exercise. °· Put ice on the injured area. °¨ Put ice in a plastic bag. °¨ Place a towel between your skin and the bag. °¨ Leave the ice on for 15-20 minutes, 03-04 times a day for the first 2 to 3 days. After that, ice and heat may be alternated to reduce pain and spasms. °· Ask your caregiver about trying back exercises and gentle massage. This may be of some benefit. °· Avoid feeling anxious or stressed. Stress increases muscle tension and can worsen back pain. It is important to recognize when you are anxious or stressed and learn ways to manage it. Exercise is a great option. °SEEK MEDICAL CARE IF: °· You have pain that is not relieved with rest or medicine. °· You have pain that does not improve in 1 week. °· You have new symptoms. °· You are generally not feeling well. °SEEK   IMMEDIATE MEDICAL CARE IF:  °· You have pain that radiates from your back into your legs. °· You develop new bowel or bladder control problems. °· You have unusual weakness or numbness in your arms or legs. °· You develop nausea or vomiting. °· You develop abdominal pain. °· You feel faint. °Document Released: 07/24/2005 Document Revised: 01/23/2012 Document Reviewed: 11/25/2013 °ExitCare® Patient Information ©2015 [employer]. This information is not intended to replace advice given to you by your health care provider. Make sure you  discuss any questions you have with your health care provider. ° ° ° ° °Emergency Department Resource Guide °1) Find a Doctor and Pay Out of Pocket °Although you won't have to find out who is covered by your insurance plan, it is a good idea to ask around and get recommendations. You will then need to call the office and see if the doctor you have chosen will accept you as a new patient and what types of options they offer for patients who are self-pay. Some doctors offer discounts or will set up payment plans for their patients who do not have insurance, but you will need to ask so you aren't surprised when you get to your appointment. ° °2) Contact Your Local Health Department °Not all health departments have doctors that can see patients for sick visits, but many do, so it is worth a call to see if yours does. If you don't know where your local health department is, you can check in your phone book. The CDC also has a tool to help you locate your state's health department, and many state websites also have listings of all of their local health departments. ° °3) Find a Walk-in Clinic °If your illness is not likely to be very severe or complicated, you may want to try a walk in clinic. These are popping up all over the country in pharmacies, drugstores, and shopping centers. They're usually staffed by nurse practitioners or physician assistants that have been trained to treat common illnesses and complaints. They're usually fairly quick and inexpensive. However, if you have serious medical issues or chronic medical problems, these are probably not your best option. ° °No Primary Care Doctor: °- Call [facility] at  832-8000 - they can help you locate a primary care doctor that  accepts your insurance, provides certain services, etc. °- Physician Referral Service- 1-800-533-3463 ° °Chronic Pain Problems: °Organization         Address  Phone   Notes  °[facility]  (336) 297-2271 Patients need  to be referred by their primary care doctor.  ° °Medication Assistance: °Organization         Address  Phone   Notes  °[place] [facility] [address] °[place], [place] [address] (336) 641-8030 --Must be a resident of [place] °-- Must have NO insurance coverage whatsoever (no Medicaid/ Medicare, etc.) °-- The pt. MUST have a primary care doctor that directs their care regularly and follows them in the community °  °[facility]  (866) 331-1348   °[employer]  (888) 892-1162   ° °Agencies that provide inexpensive medical care: °Organization         Address  Phone   Notes  °[facility]  (336) 832-8035   °[facility]    (336) 832-7272   °[facility] [address] °[place], [place] [address] (336) 832-4777   °[facility] [address], °[place] (336) 271-4999   °[facility]    (336) 373-0678   °[facility]    (336) 272-1050   °[facility] ° [address], [place] Phone:  (336) 832-4444, Fax:  (336) 832-4440 Hours of Operation:  9 am - 6 pm, M-F.  Also accepts Medicaid/Medicare and self-pay.  °[facility] ° [address], [place] Phone: (336) 832-3150, Fax: (336) 832-3151. Hours of Operation:  8:30 am - 5:30 pm, M-F.  Also accepts Medicaid and self-pay.  °[facility] [place] [address], [place] Phone: (336) 878-6027   °[facility] [address], [place], [place] (336)723-1848, Ext. 123 Mondays & Thursdays: 7-9 AM.  First 15 patients are seen on a first come, first serve basis. °  ° °Medicaid-accepting [place] Providers: ° °Organization         Address  Phone   Notes  °[facility] [address], [place] (336) 641-2100 Also accepts self-pay patients.  °[facility] [address], [place] ° (336) 856-9996   °[facility] [address], [place] (336) 288-8857   °[facility] [address] [place] [address], [place] (336) 299-7000   °Veita Bland [address], [place]  ° (336) 373-1557 Only accepts [place] Access Medicaid patients after they have their name applied to their card.  ° °Self-Pay (no insurance) in [place]: ° °Organization         Address  Phone   Notes  °Sickle Cell Patients, [facility] [address], [place] (336) 832-1970   °[facility] [address], [place] (336) 832-4400   °[facility] ° [address], [place] (336) 992-4800   °[facility]/Dr. Osei-Bonsu ° [address] [place] [address], [place] or [address], [place] (336) 841-8500 Phone number for both [place] and [place] locations is the same.  °[facility] [address], [place] (336) 299-0000   °[facility] [address] [place] [address], [place] or [address] (336) 852-7530 °(336) 878-2260   °[facility] [address], [place] (336) 350-1642, phone; (336) 294-5005, fax Sees patients 1st and 3rd Saturday of every month.  Must not qualify for public or private insurance (i.e. Medicaid, Medicare, [place] Health Choice, Veterans' Benefits) • Household income should be no more than 200% of the poverty level •The clinic cannot treat you if you are pregnant or think you are pregnant • Sexually transmitted diseases are not treated at the clinic.  ° ° °Dental Care: °Organization         Address  Phone  Notes  °[facility] [facility] [address],  (336) 641-6152 Accepts children up to age 21 who are enrolled in Medicaid or [place] Health Choice; pregnant women with a Medicaid card; and children who have applied for Medicaid or [place] Health Choice, but were declined, whose  parents can pay a reduced fee at time of service.  °[facility] [place]  [address], [place] (336) 641-7733 Accepts children up to age 21 who are enrolled in Medicaid or [place] Health Choice; pregnant women with a Medicaid card; and children who have applied for Medicaid   or [place] Health Choice, but were declined, whose parents can pay a reduced fee at time of service.  °[facility] ° [address], [place] (336) 641-4533 Patients are seen by appointment only. Walk-ins are not accepted. [facility] will see patients 18 years of age and older. °Monday - Tuesday (8am-5pm) °Most Wednesdays (8:30-5pm) °$30 per visit, cash only  °[facility] ° [address], [place] (336) 641-4533 Patients are seen by appointment only. Walk-ins are not accepted. [facility] will see patients 18 years of age and older. °One Wednesday Evening (Monthly: Volunteer Based).  $30 per visit, cash only  °[facility]  (919) 537-3737 for adults; Children under age 4, call Graduate Pediatric Dentistry at (919) [phone]. Children aged 4-14, please call (919) [phone] to request a pediatric application. ° Dental services are provided in all areas of dental care including fillings, crowns and bridges, complete and partial dentures, implants, gum treatment, root canals, and extractions. Preventive care is also provided. Treatment is provided to both adults and children. °Patients are selected via a lottery and there is often a waiting list. °  °[facility] [address], °[place] ° (336) 763-8833 [email or web address] °  °[facility] [address], [place], [place] (336)723-1848, Ext. 123 Second and Fourth Thursday of each month, opens at 6:30 AM; Clinic ends at 9 AM.  Patients are seen on a first-come first-served basis, and a limited number are seen during each clinic.  ° °[facility] °  [address], [place], [place] (336) 723-7904   Eligibility Requirements °You must have lived in [place], [place], or [place] counties for at least the last three months. °  You cannot be eligible for state or federal sponsored healthcare insurance, including Veterans Administration, Medicaid, or Medicare. °  You generally cannot be eligible for healthcare insurance through your employer.  °  How to apply: °Eligibility screenings are held every Tuesday and Wednesday afternoon from 1:00 pm until 4:00 pm. You do not need an appointment for the interview!  °[facility] [address], [place], [place] 336-631-2330   °[facility]  336-342-8273   °[facility]  336-703-3100   °[facility]  336-570-6415   ° °Behavioral Health Resources in the Community: °Intensive Outpatient Programs °Organization         Address  Phone  Notes  °[facility] [address], [place], [place] 336-878-6098   °[facility] [address], [place], [place] 336-832-9800   °[facility] [address], [place], [place] ° 336-882-2125   °[facility] [address],  °[place],  1-800-853-5163 or 336-641-4981   °Substance Abuse Resources °Organization         Address  Phone  Notes  °[facility]  336-882-2125   °[facility]  336-784-9470   °[facility]  336-285-9073   °[facility]  336-845-3988   °Residential & Outpatient Substance Abuse Program  1-800-659-3381   °Psychological Services °Organization         Address  Phone  Notes  °[facility]  336- 832-9600   °[facility]  336- 378-7881   °[facility] [address], [place] 1-800-853-5163 or 336-641-4981   ° °Mobile Crisis Teams °Organization         Address  Phone    Notes  °[facility], Mobile Crisis Care Unit  1-877-626-1772     °Assertive °[facility] ° [address] [place], [place] 336-834-9664   °Sharon DeEsch [address] °[place] 336-554-5454   ° °Self-Help/Support Groups °Organization         Address  Phone             Notes  °[facility] - variety of support groups  336- 373-1402 Call for more information  °Narcotics Anonymous (NA), [facility] [address], °[place]  2 meetings at this location  ° °Residential Treatment Programs °Organization         Address  Phone  Notes  °[facility] [address],    °[place]  1-866-801-8205   °[facility] ° [address], [place], [place] 704-293-8524   °[facility] [address], [place] 336-845-3988 Admissions: 8am-3pm M-F  °[facility] [address]    °[place], [place] 336-841-1104   °[facility] [address], [place], [place] 336-379-7146   °[facility] [address]  °[place], [place] 336-285-9073   °[facility] - Intensive Outpatient [address], [place], [place] 336-852-3033   °[facility] [address]  °[place], [place] 1-877-615-2722 or 336-784-9470   °[facility] [address] [place], [place] 336-227-7417 Accepts Medicaid  °[facility] [address]  °[place] 1-800-659-3381 Substance Abuse/Addiction Treatment  ° °[place] Behavioral Health Resources °Organization         Address  Phone  Notes  °[facility]  (888) 581-9988   °Julie Brannon, PhD [address] [place], [place]   (336) 349-5553 or (336) 951-0000   °[facility]   [address] °[place], [place] (336) 349-4454   °[facility] [address], [place], [place] (336) 342-8316 Insurance/Medicaid/sponsorship through [facility]  °[facility] [address]                                     [place], [place] (336) 342-8316 Therapy/tele-psych/case  °[facility] [address]  ° [place], [place] (336) 349-2233    °Dr. Arfeen  (336) 349-4544   °[facility] [place]  [employer] [place] [facility] 1) [address], [place] °2) [address], [place] °3)  [address], [place] (336) 349-3220 °(336) 342-7768 ° °(336) 342-8140   °[place] [facility] (336) 342-1394 or (336) 342-3537 (After Hours)    ° ° ° ° °

## 2015-01-21 ENCOUNTER — Ambulatory Visit: Payer: Self-pay | Attending: Internal Medicine

## 2015-04-07 ENCOUNTER — Emergency Department (INDEPENDENT_AMBULATORY_CARE_PROVIDER_SITE_OTHER)
Admission: EM | Admit: 2015-04-07 | Discharge: 2015-04-07 | Disposition: A | Discharge status: Home / Self Care | Payer: Self-pay | Source: Home / Self Care | Attending: Family Medicine | Admitting: Family Medicine

## 2015-04-07 ENCOUNTER — Encounter (HOSPITAL_COMMUNITY): Payer: Self-pay | Admitting: *Deleted

## 2015-04-07 DIAGNOSIS — M545 Low back pain: Secondary | ICD-10-CM

## 2015-04-07 DIAGNOSIS — G8929 Other chronic pain: Secondary | ICD-10-CM

## 2015-04-07 LAB — POCT URINALYSIS DIP (DEVICE)
Glucose, UA: NEGATIVE mg/dL
Hgb urine dipstick: NEGATIVE
KETONES UR: NEGATIVE mg/dL
Leukocytes, UA: NEGATIVE
Nitrite: NEGATIVE
PH: 5.5 (ref 5.0–8.0)
PROTEIN: 30 mg/dL — AB
UROBILINOGEN UA: 0.2 mg/dL (ref 0.0–1.0)

## 2015-04-07 LAB — POCT PREGNANCY, URINE: PREG TEST UR: NEGATIVE

## 2015-04-07 MED ORDER — CYCLOBENZAPRINE HCL 5 MG PO TABS: 5.0000 mg | ORAL_TABLET | Freq: Three times a day (TID) | ORAL | Status: DC | PRN

## 2015-04-07 MED ORDER — KETOROLAC TROMETHAMINE 30 MG/ML IJ SOLN
INTRAMUSCULAR | Status: AC
  Filled 2015-04-07: qty 1

## 2015-04-07 MED ORDER — KETOROLAC TROMETHAMINE 30 MG/ML IJ SOLN
30.0000 mg | Freq: Once | INTRAMUSCULAR | Status: AC
  Administered 2015-04-07: 30 mg via INTRAMUSCULAR

## 2015-04-07 MED ORDER — DICLOFENAC POTASSIUM 50 MG PO TABS: 50.0000 mg | ORAL_TABLET | Freq: Three times a day (TID) | ORAL | Status: DC

## 2015-04-07 NOTE — Discharge Instructions (Signed)
Heat to back and medicine as prescribed, see your orthopedist if further problems.

## 2015-04-07 NOTE — ED Notes (Signed)
Pt  Reports  Low  Back  Pain  -    Pt  Reports  Has  Had  Back  Problems  In  Past    Pt  States  She  Does  Heavy  Lifting       Pain is  Worse  Today       -      Pt      Reports  Is  Also late  On  Her  Period

## 2015-04-07 NOTE — ED Provider Notes (Signed)
CSN: [id number]     Arrival date & time 04/07/15  1853 History   First MD Initiated Contact with Patient 04/07/15 1952     Chief Complaint  Patient presents with  . Back Pain   (Consider location/radiation/quality/duration/timing/severity/associated sxs/prior Treatment) Patient is a 24 y.o. female presenting with back pain. The history is provided by the patient.  Back Pain Location:  Lumbar spine Quality:  Shooting Radiates to:  L posterior upper leg and R posterior upper leg Pain severity:  Moderate Onset quality:  Gradual Progression:  Worsening Chronicity:  Chronic Context: lifting heavy objects   Associated symptoms: leg pain   Associated symptoms: no abdominal pain, no abdominal swelling, no bladder incontinence, no bowel incontinence, no dysuria, no numbness and no paresthesias   Associated symptoms comment:  Late on menses Risk factors: lack of exercise     History reviewed. No pertinent past medical history. History reviewed. No pertinent past surgical history. History reviewed. No pertinent family history. Social History  Substance Use Topics  . Smoking status: Never Smoker   . Smokeless tobacco: None  . Alcohol Use: No   OB History    No data available     Review of Systems  Constitutional: Negative.   Gastrointestinal: Negative.  Negative for abdominal pain and bowel incontinence.  Genitourinary: Negative for bladder incontinence and dysuria.  Musculoskeletal: Positive for back pain and gait problem. Negative for myalgias, joint swelling and neck pain.  Neurological: Negative for numbness and paresthesias.    Allergies  Penicillins  Home Medications   Prior to Admission medications   Medication Sig Start Date End Date Taking? Authorizing Provider  albuterol (PROVENTIL HFA;VENTOLIN HFA) 108 (90 BASE) MCG/ACT inhaler Inhale 2 puffs into the lungs every 2 (two) hours as needed for wheezing or shortness of breath (cough). 05/25/14   Riki Altes, MD   cyclobenzaprine (FLEXERIL) 5 MG tablet Take 1 tablet (5 mg total) by mouth 3 (three) times daily as needed for muscle spasms. 04/07/15   Billy Fischer, MD  diazepam (VALIUM) 5 MG tablet Take 1 tablet (5 mg total) by mouth 2 (two) times daily. 08/14/14   Courtney Forcucci, PA-C  diclofenac (CATAFLAM) 50 MG tablet Take 1 tablet (50 mg total) by mouth 3 (three) times daily. For back pain 04/07/15   Billy Fischer, MD  oxyCODONE-acetaminophen (PERCOCET) 5-325 MG per tablet Take 2 tablets by mouth every 4 (four) hours as needed. 08/14/14   Courtney Forcucci, PA-C  predniSONE (DELTASONE) 10 MG tablet Day 1 take 6 pills Day 2 take 5 pills Day 3 take 4 pills Day 4 take 3 pills Day 5 take 2 pills Day 6 take 1 pill 08/14/14   Courtney Forcucci, PA-C  Pseudoephedrine-DM-GG (TUSSIN COLD/COUGH PO) Take 10 mLs by mouth every 6 (six) hours as needed (cough).    Historical Provider, MD   Meds Ordered and Administered this Visit   Medications  ketorolac (TORADOL) 30 MG/ML injection 30 mg (not administered)    BP 103/72 mmHg  Pulse 102  Temp(Src) 99.2 F (37.3 C) (Oral)  Resp 16  SpO2 100%  LMP 02/05/2015 (Approximate) No data found.   Physical Exam  Constitutional: She is oriented to person, place, and time. She appears well-developed and well-nourished. She appears distressed.  Abdominal: Soft. Bowel sounds are normal. There is no tenderness.  Musculoskeletal: She exhibits tenderness.  Neurological: She is alert and oriented to person, place, and time.  Skin: Skin is warm and dry.  Nursing note and vitals  reviewed.   ED Course  Procedures (including critical care time)  Labs Review Labs Reviewed  POCT URINALYSIS DIP (DEVICE) - Abnormal; Notable for the following:    Bilirubin Urine SMALL (*)    Protein, ur 30 (*)    All other components within normal limits  POCT PREGNANCY, URINE   U/a neg. Imaging Review No results found.   Visual Acuity Review  Right Eye Distance:   Left Eye  Distance:   Bilateral Distance:    Right Eye Near:   Left Eye Near:    Bilateral Near:         MDM   1. Acute exacerbation of chronic low back pain       Billy Fischer, MD 04/07/15 2055

## 2015-04-08 ENCOUNTER — Encounter (HOSPITAL_COMMUNITY): Payer: Self-pay | Admitting: Emergency Medicine

## 2015-04-08 NOTE — ED Notes (Signed)
Shelia called from [employer] about patient's doctor note Per provider patient is to return to work 04/09/15 Document that shelia has states 04/10/15 The document date was forged  shelia is aware of correct return date

## 2015-04-19 ENCOUNTER — Ambulatory Visit (INDEPENDENT_AMBULATORY_CARE_PROVIDER_SITE_OTHER): Payer: Self-pay | Admitting: Family Medicine

## 2015-04-19 ENCOUNTER — Encounter: Payer: Self-pay | Admitting: Family Medicine

## 2015-04-19 ENCOUNTER — Telehealth: Payer: Self-pay | Admitting: Family Medicine

## 2015-04-19 VITALS — BP 117/62 | HR 86 | Temp 98.1°F | Resp 14 | Ht 63.0 in | Wt 196.0 lb

## 2015-04-19 DIAGNOSIS — Z23 Encounter for immunization: Secondary | ICD-10-CM

## 2015-04-19 DIAGNOSIS — Z7689 Persons encountering health services in other specified circumstances: Secondary | ICD-10-CM

## 2015-04-19 DIAGNOSIS — Z0000 Encounter for general adult medical examination without abnormal findings: Secondary | ICD-10-CM

## 2015-04-19 DIAGNOSIS — Z7189 Other specified counseling: Secondary | ICD-10-CM

## 2015-04-19 DIAGNOSIS — M5440 Lumbago with sciatica, unspecified side: Secondary | ICD-10-CM

## 2015-04-19 LAB — CBC WITH DIFFERENTIAL/PLATELET
BASOS ABS: 0 10*3/uL (ref 0.0–0.1)
BASOS PCT: 0 % (ref 0–1)
EOS ABS: 0.2 10*3/uL (ref 0.0–0.7)
EOS PCT: 2 % (ref 0–5)
HCT: 36.2 % (ref 36.0–46.0)
Hemoglobin: 12.2 g/dL (ref 12.0–15.0)
LYMPHS ABS: 2.1 10*3/uL (ref 0.7–4.0)
Lymphocytes Relative: 18 % (ref 12–46)
MCH: 26 pg (ref 26.0–34.0)
MCHC: 33.7 g/dL (ref 30.0–36.0)
MCV: 77.2 fL — ABNORMAL LOW (ref 78.0–100.0)
MPV: 10.3 fL (ref 8.6–12.4)
Monocytes Absolute: 1.1 10*3/uL — ABNORMAL HIGH (ref 0.1–1.0)
Monocytes Relative: 9 % (ref 3–12)
NEUTROS PCT: 71 % (ref 43–77)
Neutro Abs: 8.3 10*3/uL — ABNORMAL HIGH (ref 1.7–7.7)
PLATELETS: 384 10*3/uL (ref 150–400)
RBC: 4.69 MIL/uL (ref 3.87–5.11)
RDW: 15.2 % (ref 11.5–15.5)
WBC: 11.7 10*3/uL — AB (ref 4.0–10.5)

## 2015-04-19 LAB — COMPLETE METABOLIC PANEL WITH GFR
ALK PHOS: 81 U/L (ref 33–115)
ALT: 19 U/L (ref 6–29)
AST: 15 U/L (ref 10–30)
Albumin: 3.9 g/dL (ref 3.6–5.1)
BILIRUBIN TOTAL: 0.3 mg/dL (ref 0.2–1.2)
BUN: 10 mg/dL (ref 7–25)
CALCIUM: 9.5 mg/dL (ref 8.6–10.2)
CO2: 23 mmol/L (ref 20–31)
CREATININE: 0.64 mg/dL (ref 0.50–1.10)
Chloride: 102 mmol/L (ref 98–110)
Glucose, Bld: 82 mg/dL (ref 65–99)
Potassium: 4.1 mmol/L (ref 3.5–5.3)
Sodium: 136 mmol/L (ref 135–146)
TOTAL PROTEIN: 7.1 g/dL (ref 6.1–8.1)

## 2015-04-19 LAB — TSH: TSH: 0.522 u[IU]/mL (ref 0.350–4.500)

## 2015-04-19 NOTE — Telephone Encounter (Signed)
Left a message advising patient that we would not prescribe this medication for her. Thanks!

## 2015-04-19 NOTE — Patient Instructions (Signed)
We will send in a referral for orthopedist. Avoid heavy lifting, pushing, pulling. If you find the diclofenac and cyclobenzaprine helpful let me know and I can see about refilling when you run out.  Follow-up in 2 months for PAP

## 2015-04-19 NOTE — Telephone Encounter (Signed)
Patient called stating the diclofenac (CATAFLAM) 50 MG tablet is not working and would prefer oxycodone.

## 2015-04-19 NOTE — Telephone Encounter (Signed)
We do not prescribe any Controlled substances.

## 2015-04-19 NOTE — Progress Notes (Signed)
Patient ID: Wanda Crane, female   DOB: Aug 28, 1990, 24 y.o.   MRN: [id number]   Wanda Crane, is a 24 y.o. female  EYC:[id number]  [id number]  DOB - 1991-06-18  CC:  Chief Complaint  Patient presents with  . Establish Care  . Back Pain       HPI: Wanda Crane is a 24 y.o. female here to establish care. 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 She is currently take diclofenac and flexeril prescribe in the ED on 8/31.She denies significant improvement. She reports needing a tetanus booster and request a flu shot. Allergies  Allergen Reactions  . Penicillins Hives   History reviewed. No pertinent past medical history. Current Outpatient Prescriptions on File Prior to Visit  Medication Sig Dispense Refill  . cyclobenzaprine (FLEXERIL) 5 MG tablet Take 1 tablet (5 mg total) by mouth 3 (three) times daily as needed for muscle spasms. 30 tablet 0  . diclofenac (CATAFLAM) 50 MG tablet Take 1 tablet (50 mg total) by mouth 3 (three) times daily. For back pain 30 tablet 0  . albuterol (PROVENTIL HFA;VENTOLIN HFA) 108 (90 BASE) MCG/ACT inhaler Inhale 2 puffs into the lungs every 2 (two) hours as needed for wheezing or shortness of breath (cough). (Patient not taking: Reported on 04/19/2015) 1 Inhaler 0  . diazepam (VALIUM) 5 MG tablet Take 1 tablet (5 mg total) by mouth 2 (two) times daily. (Patient not taking: Reported on 04/19/2015) 10 tablet 0  . oxyCODONE-acetaminophen (PERCOCET) 5-325 MG per tablet Take 2 tablets by mouth every 4 (four) hours as needed. (Patient not taking: Reported on 04/19/2015) 10 tablet 0  . predniSONE (DELTASONE) 10 MG tablet Day 1 take 6 pills Day 2 take 5 pills Day 3 take 4 pills Day 4 take 3 pills Day 5 take 2 pills Day 6 take 1 pill (Patient not taking: Reported on 04/19/2015) 21 tablet 0  .  Pseudoephedrine-DM-GG (TUSSIN COLD/COUGH PO) Take 10 mLs by mouth every 6 (six) hours as needed (cough).     No current facility-administered medications on file prior to visit.   Family History  Problem Relation Age of Onset  . Asthma Mother   . COPD Mother   . Diabetes Maternal Grandmother   . Heart disease Maternal Grandmother   . Stroke Maternal Grandmother   . Diabetes Maternal Grandfather   . Heart disease Maternal Grandfather   . Stroke Maternal Grandfather    Social History   Social History  . Marital Status: Single    Spouse Name: N/A  . Number of Children: N/A  . Years of Education: N/A   Occupational History  . Not on file.   Social History Main Topics  . Smoking status: Never Smoker   . Smokeless tobacco: Never Used  . Alcohol Use: No  . Drug Use: No  . Sexual Activity: Yes   Other Topics Concern  . Not on file   Social History Narrative    Review of Systems: Constitutional: Negative for fever, chills, appetite change, weight loss. Positive for Fatigue. Skin: Negative for rashes or lesions of concern. HENT: Negative for ear pain, ear discharge.nose bleeds Eyes: Negative for pain, discharge, redness, itching and visual disturbance. Neck: Negative for pain, stiffness Respiratory: Negative for cough, shortness of breath,   Cardiovascular: Negative for chest pain, palpitations and leg swelling. Gastrointestinal: Negative for abdominal  pain, nausea, vomiting, diarrhea, constipations Genitourinary: Negative for dysuria, urgency, frequency, hematuria,  Musculoskeletal: Positive for low back pain with radiation into legs. Neurological: Negative for dizziness, tremors, seizures, syncope,   light-headedness, numbness and headaches.  Hematological: Negative for easy bruising or bleeding Psychiatric/Behavioral: Negative for depression, anxiety, decreased concentration, confusion   Objective:   Filed Vitals:   04/19/15 1039  BP: 117/62  Pulse: 86  Temp:  98.1 F (36.7 C)  Resp: 14    Physical Exam: Constitutional: Patient appears well-developed and well-nourished. No distress. HENT: Normocephalic, atraumatic, External right and left ear normal. Oropharynx is clear and moist.  Eyes: Conjunctivae and EOM are normal. PERRLA, no scleral icterus. Neck: Normal ROM. Neck supple. No lymphadenopathy, No thyromegaly. CVS: RRR, S1/S2 +, no murmurs, no gallops, no rubs Pulmonary: Effort and breath sounds normal, no stridor, rhonchi, wheezes, rales.  Abdominal: Soft. Normoactive BS,, no distension, tenderness, rebound or guarding.  Musculoskeletal: Normal range of motion. No edema and no tenderness.  Neuro: Alert.Normal muscle tone coordination. Non-focal Skin: Skin is warm and dry. No rash noted. Not diaphoretic. No erythema. No pallor. Psychiatric: Normal mood and affect. Behavior, judgment, thought content normal.  No results found for: WBC, HGB, HCT, MCV, PLT No results found for: CREATININE, BUN, NA, K, CL, CO2  No results found for: HGBA1C Lipid Panel  No results found for: CHOL, TRIG, HDL, CHOLHDL, VLDL, LDLCALC     Assessment and plan:   1. Health care maintenance  - COMPLETE METABOLIC PANEL WITH GFR - CBC with Differential - TSH - HIV antibody (with reflex) -Tdap -influenza -return in 2 months for PAP.  2. Encounter to establish care -I have reviewed information presented by the patient and pertient information in her hospital record.  3. Midline low back pain with sciatica, sciatica laterality unspecified -Referral to orthopedic    Return in about 2 months (around 06/19/2015).  The patient was given clear instructions to go to ER or return to medical center if symptoms don't improve, worsen or new problems develop. The patient verbalized understanding.    Micheline Chapman FNP  04/19/2015, 12:00 PM

## 2015-04-19 NOTE — Telephone Encounter (Signed)
Mrs. Vaughan Basta,  Please advise. Thanks!

## 2015-04-20 LAB — HIV ANTIBODY (ROUTINE TESTING W REFLEX): HIV: NONREACTIVE

## 2015-06-21 ENCOUNTER — Ambulatory Visit: Payer: Self-pay | Admitting: Family Medicine

## 2015-07-05 ENCOUNTER — Ambulatory Visit: Payer: Self-pay | Admitting: Family Medicine

## 2015-07-15 ENCOUNTER — Ambulatory Visit: Payer: Self-pay | Admitting: Family Medicine

## 2015-08-11 ENCOUNTER — Encounter (HOSPITAL_COMMUNITY): Payer: Self-pay | Admitting: Family Medicine

## 2015-08-11 ENCOUNTER — Emergency Department (HOSPITAL_COMMUNITY)
Admission: EM | Admit: 2015-08-11 | Discharge: 2015-08-11 | Disposition: A | Discharge status: Home / Self Care | Payer: Self-pay | Attending: Physician Assistant | Admitting: Physician Assistant

## 2015-08-11 DIAGNOSIS — K0889 Other specified disorders of teeth and supporting structures: Secondary | ICD-10-CM | POA: Insufficient documentation

## 2015-08-11 DIAGNOSIS — Z791 Long term (current) use of non-steroidal anti-inflammatories (NSAID): Secondary | ICD-10-CM | POA: Insufficient documentation

## 2015-08-11 DIAGNOSIS — F419 Anxiety disorder, unspecified: Secondary | ICD-10-CM | POA: Insufficient documentation

## 2015-08-11 DIAGNOSIS — K029 Dental caries, unspecified: Secondary | ICD-10-CM | POA: Insufficient documentation

## 2015-08-11 MED ORDER — IBUPROFEN 400 MG PO TABS
800.0000 mg | ORAL_TABLET | Freq: Once | ORAL | Status: AC
  Administered 2015-08-11: 800 mg via ORAL
  Filled 2015-08-11: qty 2

## 2015-08-11 MED ORDER — PENICILLIN V POTASSIUM 500 MG PO TABS: 500.0000 mg | ORAL_TABLET | Freq: Four times a day (QID) | ORAL | Status: AC

## 2015-08-11 MED ORDER — OXYCODONE-ACETAMINOPHEN 5-325 MG PO TABS: 1.0000 | ORAL_TABLET | Freq: Four times a day (QID) | ORAL | Status: DC | PRN

## 2015-08-11 MED ORDER — ONDANSETRON 4 MG PO TBDP
4.0000 mg | ORAL_TABLET | Freq: Once | ORAL | Status: AC
  Administered 2015-08-11: 4 mg via ORAL
  Filled 2015-08-11: qty 1

## 2015-08-11 MED ORDER — PENICILLIN V POTASSIUM 250 MG PO TABS
500.0000 mg | ORAL_TABLET | Freq: Once | ORAL | Status: AC
  Administered 2015-08-11: 500 mg via ORAL
  Filled 2015-08-11: qty 2

## 2015-08-11 MED ORDER — OXYCODONE-ACETAMINOPHEN 5-325 MG PO TABS
2.0000 | ORAL_TABLET | Freq: Once | ORAL | Status: AC
  Administered 2015-08-11: 2 via ORAL
  Filled 2015-08-11: qty 2

## 2015-08-11 NOTE — ED Notes (Signed)
Pt here for right upper dental pain. sts she has been having problems with her wisdom teeth and cant get into a dentist.

## 2015-08-11 NOTE — ED Provider Notes (Signed)
CSN: [id number]     Arrival date & time 08/11/15  1634 History  By signing my name below, I, Starleen Arms, attest that this documentation has been prepared under the direction and in the presence of Wendie Simmer, PA-C. Electronically Signed: Starleen Arms ED Scribe. 08/11/2015. 5:55 PM.    Chief Complaint  Patient presents with  . Dental Pain   The history is provided by the patient. No language interpreter was used.   HPI Comments: Wanda Crane is a 25 y.o. female who presents to the Emergency Department complaining of constant, moderate, non-draining right upper dental pain onset 1 week ago with worsening today; worse with chewing/cold beverages. She describes her pain as 10/10 pain scale, non-radiating, throbbing ache. Associated symptoms include headache.  The patient has been unsuccessful in establishing dental care due to long wait times.  She denies fever, chills, nausea, vomiting, drainage.  History reviewed. No pertinent past medical history. History reviewed. No pertinent past surgical history. Family History  Problem Relation Age of Onset  . Asthma Mother   . COPD Mother   . Diabetes Maternal Grandmother   . Heart disease Maternal Grandmother   . Stroke Maternal Grandmother   . Diabetes Maternal Grandfather   . Heart disease Maternal Grandfather   . Stroke Maternal Grandfather    Social History  Substance Use Topics  . Smoking status: Never Smoker   . Smokeless tobacco: Never Used  . Alcohol Use: No   OB History    No data available     Review of Systems 10 Systems reviewed and all are negative for acute change except as noted in the HPI.  Allergies  Review of patient's allergies indicates no active allergies.  Home Medications   Prior to Admission medications   Medication Sig Start Date End Date Taking? Authorizing Provider  cyclobenzaprine (FLEXERIL) 5 MG tablet Take 1 tablet (5 mg total) by mouth 3 (three) times daily as needed for muscle spasms. 04/07/15    Billy Fischer, MD  diclofenac (CATAFLAM) 50 MG tablet Take 1 tablet (50 mg total) by mouth 3 (three) times daily. For back pain 04/07/15   Billy Fischer, MD   BP 127/70 mmHg  Pulse 92  Temp(Src) 98.1 F (36.7 C) (Oral)  Resp 18  SpO2 98% Physical Exam  Constitutional: She is oriented to person, place, and time. She appears well-developed and well-nourished. No distress.  HENT:  Head: Normocephalic and atraumatic.  Right Ear: External ear normal.  Left Ear: External ear normal.  Nose: Nose normal.  Mouth/Throat: Oropharynx is clear and moist. No oropharyngeal exudate.  Multiple dental caries. Poor dentition. Right maxilla posterior gingiva slightly edematous. No erythema, drainage.  Eyes: Conjunctivae are normal. Pupils are equal, round, and reactive to light. Right eye exhibits no discharge. Left eye exhibits no discharge. No scleral icterus.  Neck: Normal range of motion. Neck supple. No tracheal deviation present.  Cardiovascular: Normal rate, regular rhythm, normal heart sounds and intact distal pulses.  Exam reveals no gallop and no friction rub.   No murmur heard. Pulmonary/Chest: Effort normal and breath sounds normal. No respiratory distress. She has no wheezes. She has no rales. She exhibits no tenderness.  Abdominal: Soft. Bowel sounds are normal. She exhibits no distension and no mass. There is no tenderness. There is no rebound and no guarding.  Musculoskeletal: Normal range of motion. She exhibits no edema.  Lymphadenopathy:    She has no cervical adenopathy.  Neurological: She is alert and oriented to  person, place, and time. Coordination normal.  Skin: Skin is warm and dry. No rash noted. She is not diaphoretic. No erythema.  Psychiatric:  Patient anxious and crying  Nursing note and vitals reviewed.   ED Course  Procedures   DIAGNOSTIC STUDIES: Oxygen Saturation is 98% on RA, normal by my interpretation.    COORDINATION OF CARE:  6:00 PM Discussed treatment  plan with patient at bedside.  Patient acknowledges and agrees with plan.    MDM   Final diagnoses:  Pain, dental   Patient non-toxic appearing and VSS. 2 tablets of percocet were ordered prior to my evaluation. Patient taking this during my evaluation. I went to reassess patient 30 minutes after her pain medication was given, and her visitor has requested patient receive something more for pain. Patient offered ibuprofen. Visitor requesting antibiotics. I explained one pill will not work without continuing to take penicillin for the entire prescribed regimen but that the patient may have a tablet in the ED.  According to the patient, she has been trying to get an appointment with a dentist for 4 months and no one in the area will accept her as a new patient. Provided patient with resource guide. Patient may be safely discharged home. Discussed reasons for return. Patient to follow-up with dentist. Patient in understanding and agreement with the plan.  I personally performed the services described in this documentation, which was scribed in my presence. The recorded information has been reviewed and is accurate.   [place], PA-C 08/13/15 [place], MD 08/14/15 1137

## 2015-08-11 NOTE — Discharge Instructions (Signed)
Wanda Crane,  Nice meeting you! Please follow-up with a dentist. Return to the emergency department if you have increasing pain despite antibiotics and pain medication. Feel better soon!  S. Wendie Simmer, PA-C   Emergency Department Resource Guide 1) Find a Doctor and Pay Out of Pocket Although you won't have to find out who is covered by your insurance plan, it is a good idea to ask around and get recommendations. You will then need to call the office and see if the doctor you have chosen will accept you as a new patient and what types of options they offer for patients who are self-pay. Some doctors offer discounts or will set up payment plans for their patients who do not have insurance, but you will need to ask so you aren't surprised when you get to your appointment.  2) Contact Your Local Health Department Not all health departments have doctors that can see patients for sick visits, but many do, so it is worth a call to see if yours does. If you don't know where your local health department is, you can check in your phone book. The CDC also has a tool to help you locate your state's health department, and many state websites also have listings of all of their local health departments.  3) Find a [facility] If your illness is not likely to be very severe or complicated, you may want to try a walk in clinic. These are popping up all over the country in pharmacies, drugstores, and shopping centers. They're usually staffed by nurse practitioners or physician assistants that have been trained to treat common illnesses and complaints. They're usually fairly quick and inexpensive. However, if you have serious medical issues or chronic medical problems, these are probably not your best option.  No Primary Care Doctor: - Call Health Connect at  [phone] - they can help you locate a primary care doctor that  accepts your insurance, provides certain services, etc. - Physician Referral  Service- [phone]  Chronic Pain Problems: Organization         Address  Phone   Notes  [facility]  [phone] Patients need to be referred by their primary care doctor.   Medication Assistance: Organization         Address  Phone   Notes  [facility] Medication [facility] [address] [place], [place] [address] [phone] --Must be a resident of [facility] -- Must have NO insurance coverage whatsoever (no Medicaid/ Medicare, etc.) -- The pt. MUST have a primary care doctor that directs their care regularly and follows them in the community   MedAssist  [phone]   [employer]  [phone]    Agencies that provide inexpensive medical care: Organization         Address  Phone   Notes  [place]  [phone]   [facility]    [phone]   [facility] [place],  [address] [phone]   [place] [address], [place] [phone]   Planned Parenthood    [phone]   [facility]    [phone]   [place] and [place] [address], [place] Phone:  [phone], Fax:  [phone] Hours of Operation:  9 am - 6 pm, M-F.  Also accepts Medicaid/Medicare and self-pay.  [facility]  [address], [place] Phone: [phone], Fax: [phone]. Hours of Operation:  8:30 am - 5:30 pm, M-F.  Also accepts Medicaid and self-pay.  [facility] [place] [address] [place] Phone: [phone]   [place], [place], [place] [phone], Ext. [phone] Mondays & Thursdays: 7-9 AM.  First 15 patients are seen on a first come, first serve basis.    [place] Providers:  Organization         Address  Phone   Notes  [facility] [address], [place] [phone] Also accepts self-pay patients.  [place] [id number] [place], [place]  [phone]   [place], [address], [place] [phone]   [facility] [address] [place] [phone]   Lucianne Lei [address], [place]   [phone] Only accepts [place] Access [place] patients after they have their name applied to their card.   Self-Pay (no insurance) in [facility]:  Organization         Address  Phone   Notes  Sickle Cell Patients, [facility] Internal Medicine [place] [phone]   [facility]gent Care [place] [phone]   [facility] [place]  [place], [place], [place] [phone]   [facility]/Dr. Osei-Bonsu  [address] [place] or [address], [place] [phone] Phone number for both [place] and [place] locations is the same.  Urgent Medical and [facility] [address] [place] [phone]   [facility] [address], [place] or [address] [phone]   [facility] [address] [place] [phone], phone; [phone], fax Sees patients 1st and 3rd Saturday of every month.  Must not qualify for public or private insurance (i.e. Medicaid, Medicare, [place] Health Choice, Veterans' Benefits)  Household income should be no more than 200% of the poverty level The clinic cannot treat you if you are pregnant or think you are pregnant  Sexually transmitted diseases are not treated at the clinic.    Dental Care: Organization         Address  Phone  Notes  [facility] Department of [facility] [place] [phone] Accepts children up to age 55 who are enrolled in  [place] or [place]; pregnant women with a Medicaid card; and children who have applied for Medicaid or [place] Health Choice, but were declined, whose parents can pay a reduced fee at time of service.  [facility] Department of [facility]  [address], [place] [phone] Accepts children up to age 34 who are enrolled in [place] or [place]; pregnant women with a Medicaid card; and children who have applied for Medicaid or [place] Health Choice, but were declined, whose parents can pay a reduced fee at time of service.  [place] Adult Dental Access PROGRAM  [place] [phone] Patients are seen by appointment only. Walk-ins are not accepted. [place] will see patients 77 years of age and older. Monday - Tuesday (8am-5pm) Most Wednesdays (8:30-5pm) $30 per visit, cash only  [place] Adult Dental Access PROGRAM  [address], [facility] [phone] Patients are seen by appointment only. Walk-ins are not accepted. [place] will see patients 55 years of age and older. One Wednesday Evening (Monthly: Volunteer Based).  $30 per visit, cash only  [place]  [phone] for adults; Children under age 16, call Graduate Pediatric Dentistry at [phone]. Children aged 15-14, please call [phone] to request a pediatric application.  Dental services are provided in all areas of dental care including fillings, crowns and bridges, complete and partial dentures, implants, gum treatment, root canals, and extractions. Preventive care is also provided. Treatment is provided to both adults and children. Patients are selected via a lottery and there is often a waiting list.   [facility] [address] [place]  [phone] [email or web address]   [facility] [address] [place], [place] [phone], Ext. [phone] Second and Fourth Thursday of each month, opens at 6:30  AM; Clinic ends at 9 AM.  Patients are seen on a first-come first-served basis, and a limited number are seen during each clinic.   [facility]  [address] Hillard Danker [place], [place] [phone]   Eligibility Requirements You must have lived in [place], [place], or [place] counties for at least the last three months.   You cannot be eligible for state or federal sponsored [employer], including [employer], [place], or [employer].   You generally cannot be eligible for healthcare insurance through your employer.    How to apply: Eligibility screenings are held every Tuesday and Wednesday afternoon from 1:00 pm until 4:00 pm. You do not need an appointment for the interview!  [facility] [address] [place], [place]   [employer]  [place] Department  [place]  [phone]    Behavioral Health Resources in the Community: Intensive Outpatient Programs Organization         Address  Phone  Notes  [place]. [address], [place], [place] [phone]   [facility] Outpatient [address], [place], [employer]   ADS: Alcohol & Drug Svcs [address], [place], [place]   [place] [address]  [place], [place] or [phone]   Substance Abuse Resources Organization         Address  Phone  Notes  Alcohol and Drug Services  [phone]   [place]  [phone]   The [place]   Chinita Pester  [phone]   Residential & Outpatient Substance Abuse Program  [phone]   Psychological Services Organization         Address  Phone  Notes  [facility] [place]  [place]  [phone]   [place] [address] [place] [phone] or  [phone]    Mobile Crisis Teams Organization         Address  Phone  Notes  [facility], Mobile Crisis Care Unit  [phone]   [facility] Psychotherapeutic Services  [address] [place], [place]   [place] [address] [place] [phone]    Self-Help/Support Groups Organization         Address  Phone             Notes  Mental Health Assoc. of [place] - variety of support groups  [place] Call for more information  Narcotics Anonymous (NA), Caring Services [address], [employer] [place]  2 meetings at this location   Special educational needs teacher         Address  Phone  Notes  [facility] Residential Treatment [place],    [place]  [phone]   [facility]  [address], [place] [address], [place], [place]   [place], [place] [phone] Admissions: 8am-3pm M-F  Incentives Substance [place] [address],    [place], [place] [phone]   [facility] [address] [place], [place], [place]   [facility] [address],  [place], [place]   [facility] Programs - Intensive Outpatient [address], [place], [place]   [facility] ([place].,  [place], [place] [phone] or [phone]   [facility] Services (RTS) [address] [place], [place] Accepts Medicaid  [facility] [place] [address]  [place] [phone] Substance Abuse/Addiction Treatment   [facility] Organization         Address  Phone  Notes  [facility]  [phone]   Domenic Schwab, PhD [address] Arlis Porta [place], [place]   [phone] or [phone]   [place], [place] [phone]   [facility] [address],  [place], [place] [phone] Insurance/Medicaid/sponsorship through [facility] and Families [address], [address]                                    [place], [place] [phone] [place] [address], [place] [phone]    Dr. Adele Schilder  [phone]   [facility] Dept. 1) [address] [place] 2) [place] 3)  [place] [address], [place] [phone]  [phone]   [place] [phone] or [phone] (After Hours)

## 2015-08-12 ENCOUNTER — Ambulatory Visit: Payer: Self-pay | Admitting: Family Medicine

## 2015-09-20 ENCOUNTER — Ambulatory Visit (INDEPENDENT_AMBULATORY_CARE_PROVIDER_SITE_OTHER): Payer: Self-pay | Admitting: Family Medicine

## 2015-09-20 ENCOUNTER — Encounter: Payer: Self-pay | Admitting: Family Medicine

## 2015-09-20 ENCOUNTER — Other Ambulatory Visit (HOSPITAL_COMMUNITY)
Admission: RE | Admit: 2015-09-20 | Discharge: 2015-09-20 | Disposition: A | Discharge status: Home / Self Care | Payer: Self-pay | Source: Ambulatory Visit | Attending: Family Medicine | Admitting: Family Medicine

## 2015-09-20 VITALS — BP 116/61 | HR 74 | Temp 98.6°F | Ht 63.0 in | Wt 198.0 lb

## 2015-09-20 DIAGNOSIS — Z1151 Encounter for screening for human papillomavirus (HPV): Secondary | ICD-10-CM | POA: Insufficient documentation

## 2015-09-20 DIAGNOSIS — K029 Dental caries, unspecified: Secondary | ICD-10-CM

## 2015-09-20 DIAGNOSIS — Z0000 Encounter for general adult medical examination without abnormal findings: Secondary | ICD-10-CM

## 2015-09-20 DIAGNOSIS — Z01419 Encounter for gynecological examination (general) (routine) without abnormal findings: Secondary | ICD-10-CM | POA: Insufficient documentation

## 2015-09-20 DIAGNOSIS — M549 Dorsalgia, unspecified: Secondary | ICD-10-CM

## 2015-09-20 MED ORDER — DICLOFENAC POTASSIUM 50 MG PO TABS
50.0000 mg | ORAL_TABLET | Freq: Three times a day (TID) | ORAL | Status: DC
Start: 2015-09-20 — End: 2016-02-04

## 2015-09-20 MED ORDER — CYCLOBENZAPRINE HCL 5 MG PO TABS: 5.0000 mg | ORAL_TABLET | Freq: Three times a day (TID) | ORAL | Status: DC | PRN

## 2015-09-20 NOTE — Progress Notes (Signed)
Patient ID: Wanda Crane, female   DOB: 1991/01/12, 25 y.o.   MRN: [id number]   Wanda Crane, is a 25 y.o. female  [id number]  [id number]  DOB - 09-Jun-1991  CC:  Chief Complaint  Patient presents with  . Follow-up    needs pap smear, and refills on all 3 meds, needs referral to ortho for lower back pain and dental issues...pain with wisdom teeth       HPI: Wanda Crane is a 25 y.o. female here for PAP. We were unable to do when she was seen last and I asked her to return.  She has a history of chronic back pain and request refills on Diclofenac and Flexeril. There has been no interval change in the back pain. She also request referral to dentist. She has an orange card.  No Known Allergies History reviewed. No pertinent past medical history. Current Outpatient Prescriptions on File Prior to Visit  Medication Sig Dispense Refill  . oxyCODONE-acetaminophen (PERCOCET/ROXICET) 5-325 MG tablet Take 1-2 tablets by mouth every 6 (six) hours as needed for severe pain. (Patient not taking: Reported on 09/20/2015) 15 tablet 0   No current facility-administered medications on file prior to visit.   Family History  Problem Relation Age of Onset  . Asthma Mother   . COPD Mother   . Diabetes Maternal Grandmother   . Heart disease Maternal Grandmother   . Stroke Maternal Grandmother   . Diabetes Maternal Grandfather   . Heart disease Maternal Grandfather   . Stroke Maternal Grandfather    Social History   Social History  . Marital Status: Single    Spouse Name: N/A  . Number of Children: N/A  . Years of Education: N/A   Occupational History  . Not on file.   Social History Main Topics  . Smoking status: Never Smoker   . Smokeless tobacco: Never Used  . Alcohol Use: No  . Drug Use: No  . Sexual Activity: Yes   Other Topics Concern  . Not on file   Social History Narrative    Review of Systems: Deferred   Objective:   Filed Vitals:   09/20/15 1359  BP: 116/61   Pulse: 74  Temp: 98.6 F (37 C)    Physical Exam: Constitutional: Patient appears well-developed and well-nourished. No distress Abdominal: Soft. Normoactive BS,, no distension, tendernes Neuro:Alert, oriened, non-focal  Skin is warm and dry. No rash noted. Not diaphoretic. No erythema. No pallor. Psychiatric: Normal mood and affect. Behavior, judgment, thought content normal.  Lab Results  Component Value Date   WBC 11.7* 04/19/2015   HGB 12.2 04/19/2015   HCT 36.2 04/19/2015   MCV 77.2* 04/19/2015   PLT 384 04/19/2015   Lab Results  Component Value Date   CREATININE 0.64 04/19/2015   BUN 10 04/19/2015   NA 136 04/19/2015   K 4.1 04/19/2015   CL 102 04/19/2015   CO2 23 04/19/2015    No results found for: HGBA1C Lipid Panel  No results found for: CHOL, TRIG, HDL, CHOLHDL, VLDL, LDLCALC     Assessment and plan:   1. Health care maintenance  - Cytology - PAP [place]  2. Back pain, unspecified location  - cyclobenzaprine (FLEXERIL) 5 MG tablet; Take 1 tablet (5 mg total) by mouth 3 (three) times daily as needed for muscle spasms.  Dispense: 30 tablet; Refill: 0 - diclofenac (CATAFLAM) 50 MG tablet; Take 1 tablet (50 mg total) by mouth 3 (three) times daily. For back pain  Dispense: 30 tablet; Refill: 0   PRN  The patient was given clear instructions to go to ER or return to medical center if symptoms don't improve, worsen or new problems develop. The patient verbalized understanding.    Micheline Chapman FNP  09/20/2015, 2:36 PM

## 2015-09-20 NOTE — Patient Instructions (Addendum)
Will put in referral to orthopedist and to dentist. Will let you know if there is any abnormality on your lab.  If it is normal, you will not need another for 5 years.

## 2015-09-22 LAB — CYTOLOGY - PAP

## 2015-09-23 ENCOUNTER — Other Ambulatory Visit: Payer: Self-pay | Admitting: Family Medicine

## 2015-09-23 ENCOUNTER — Telehealth: Payer: Self-pay

## 2015-09-23 MED ORDER — METRONIDAZOLE 500 MG PO TABS: ORAL_TABLET | ORAL | Status: DC

## 2015-09-23 NOTE — Telephone Encounter (Signed)
Patient aware of pap results and trichamonas.  Advised her of prescription and that her partner needs to be treated.

## 2015-09-23 NOTE — Telephone Encounter (Signed)
-----   Message from Micheline Chapman, NP sent at 09/23/2015  9:30 AM EST ----- There are no abnormal (cancerous cells) but you do have trichomonas. Will send in prescription.

## 2015-09-29 ENCOUNTER — Ambulatory Visit: Payer: Self-pay | Attending: Internal Medicine

## 2016-02-04 ENCOUNTER — Encounter (HOSPITAL_COMMUNITY): Payer: Self-pay | Admitting: Nurse Practitioner

## 2016-02-04 ENCOUNTER — Ambulatory Visit (HOSPITAL_COMMUNITY)
Admission: EM | Admit: 2016-02-04 | Discharge: 2016-02-04 | Disposition: A | Discharge status: Home / Self Care | Payer: Self-pay | Attending: Emergency Medicine | Admitting: Emergency Medicine

## 2016-02-04 ENCOUNTER — Ambulatory Visit (HOSPITAL_COMMUNITY): Payer: Self-pay

## 2016-02-04 DIAGNOSIS — S60221A Contusion of right hand, initial encounter: Secondary | ICD-10-CM | POA: Insufficient documentation

## 2016-02-04 DIAGNOSIS — W230XXA Caught, crushed, jammed, or pinched between moving objects, initial encounter: Secondary | ICD-10-CM | POA: Insufficient documentation

## 2016-02-04 MED ORDER — NAPROXEN 375 MG PO TABS: 375.0000 mg | ORAL_TABLET | Freq: Two times a day (BID) | ORAL | Status: DC

## 2016-02-04 NOTE — Discharge Instructions (Signed)
Hand Contusion °A hand contusion is a deep bruise on your hand area. Contusions are the result of an injury that caused bleeding under the skin. The contusion may turn blue, purple, or yellow. Minor injuries will give you a painless contusion, but more severe contusions may stay painful and swollen for a few weeks. °CAUSES  °A contusion is usually caused by a blow, trauma, or direct force to an area of the body. °SYMPTOMS  °· Swelling and redness of the injured area. °· Discoloration of the injured area. °· Tenderness and soreness of the injured area. °· Pain. °DIAGNOSIS  °The diagnosis can be made by taking a history and performing a physical exam. An X-ray, CT scan, or MRI may be needed to determine if there were any associated injuries, such as broken bones (fractures). °TREATMENT  °Often, the best treatment for a hand contusion is resting, elevating, icing, and applying cold compresses to the injured area. Over-the-counter medicines may also be recommended for pain control. °HOME CARE INSTRUCTIONS  °· Put ice on the injured area. °· Put ice in a plastic bag. °· Place a towel between your skin and the bag. °· Leave the ice on for 15-20 minutes, 03-04 times a day. °· Only take over-the-counter or prescription medicines as directed by your caregiver. Your caregiver may recommend avoiding anti-inflammatory medicines (aspirin, ibuprofen, and naproxen) for 48 hours because these medicines may increase bruising. °· If told, use an elastic wrap as directed. This can help reduce swelling. You may remove the wrap for sleeping, showering, and bathing. If your fingers become numb, cold, or blue, take the wrap off and reapply it more loosely. °· Elevate your hand with pillows to reduce swelling. °· Avoid overusing your hand if it is painful. °SEEK IMMEDIATE MEDICAL CARE IF:  °· You have increased redness, swelling, or pain in your hand. °· Your swelling or pain is not relieved with medicines. °· You have loss of feeling in  your hand or are unable to move your fingers. °· Your hand turns cold or blue. °· You have pain when you move your fingers. °· Your hand becomes warm to the touch. °· Your contusion does not improve in 2 days. °MAKE SURE YOU:  °· Understand these instructions. °· Will watch your condition. °· Will get help right away if you are not doing well or get worse. °  °This information is not intended to replace advice given to you by your health care provider. Make sure you discuss any questions you have with your health care provider. °  °Document Released: 01/13/2002 Document Revised: 04/17/2012 Document Reviewed: 01/15/2012 °Elsevier Interactive Patient Education ©2016 [employer] ° °Cryotherapy °Cryotherapy is when you put ice on your injury. Ice helps lessen pain and puffiness (swelling) after an injury. Ice works the best when you start using it in the first 24 to 48 hours after an injury. °HOME CARE °· Put a dry or damp towel between the ice pack and your skin. °· You may press gently on the ice pack. °· Leave the ice on for no more than 10 to 20 minutes at a time. °· Check your skin after 5 minutes to make sure your skin is okay. °· Rest at least 20 minutes between ice pack uses. °· Stop using ice when your skin loses feeling (numbness). °· Do not use ice on someone who cannot tell you when it hurts. This includes small children and people with memory problems (dementia). °GET HELP RIGHT AWAY IF: °·   You have white spots on your skin. °· Your skin turns blue or pale. °· Your skin feels waxy or hard. °· Your puffiness gets worse. °MAKE SURE YOU:  °· Understand these instructions. °· Will watch your condition. °· Will get help right away if you are not doing well or get worse. °  °This information is not intended to replace advice given to you by your health care provider. Make sure you discuss any questions you have with your health care provider. °  °Document Released: 01/10/2008 Document Revised: 10/16/2011  Document Reviewed: 03/16/2011 °Elsevier Interactive Patient Education ©2016 [employer] ° °

## 2016-02-04 NOTE — ED Provider Notes (Signed)
CSN: [id number]     Arrival date & time 02/04/16  1437 History   First MD Initiated Contact with Patient 02/04/16 1504     Chief Complaint  Patient presents with  . Hand Injury   (Consider location/radiation/quality/duration/timing/severity/associated sxs/prior Treatment) HPI Comments: 25 year old female was assisting in moving a television against the wall last PM. Her right hand got caught between the TV and the wall producing a crushing injury. She is complaining of pain and swelling and redness to the dorsum of the hand including the right ring finger and proximal right fifth digit. Denies other injury.   History reviewed. No pertinent past medical history. History reviewed. No pertinent past surgical history. Family History  Problem Relation Age of Onset  . Asthma Mother   . COPD Mother   . Diabetes Maternal Grandmother   . Heart disease Maternal Grandmother   . Stroke Maternal Grandmother   . Diabetes Maternal Grandfather   . Heart disease Maternal Grandfather   . Stroke Maternal Grandfather    Social History  Substance Use Topics  . Smoking status: Never Smoker   . Smokeless tobacco: Never Used  . Alcohol Use: No   OB History    No data available     Review of Systems  Constitutional: Negative for fever, chills and activity change.  HENT: Negative.   Respiratory: Negative.   Cardiovascular: Negative.   Musculoskeletal: Negative for back pain, gait problem, neck pain and neck stiffness.       As per HPI  Skin: Negative for color change, pallor and rash.  Neurological: Negative.   All other systems reviewed and are negative.   Allergies  Review of patient's allergies indicates no known allergies.  Home Medications   Prior to Admission medications   Medication Sig Start Date End Date Taking? Authorizing Provider  naproxen (NAPROSYN) 375 MG tablet Take 1 tablet (375 mg total) by mouth 2 (two) times daily. 02/04/16   Janne Napoleon, NP   Meds Ordered and  Administered this Visit  Medications - No data to display  BP 116/75 mmHg  Pulse 70  Temp(Src) 98.6 F (37 C) (Oral)  Resp 18  SpO2 100%  LMP 01/04/2016 (Exact Date) No data found.   Physical Exam  Constitutional: She is oriented to person, place, and time. She appears well-developed and well-nourished. No distress.  HENT:  Head: Normocephalic and atraumatic.  Eyes: EOM are normal.  Neck: Normal range of motion.  Cardiovascular: Normal rate.   Pulmonary/Chest: Effort normal.  Musculoskeletal:  Right hand with swelling to the dorsal aspect. Erythema to the same area to include the right ring finger and proximal right fifth digit. No deformities. Patient is able to make a loose fist and extend the digits. The fifth digit does not extend completely due to pain. Tenderness to the posterior hand as well as the third fourth and fifth digits. Thumb opposition is intact. Wrist without tenderness discoloration. Performs full range of motion of the wrist. Distal neurovascular motor sensory is intact. Radial pulse 2+. Normal warmth.  Neurological: She is alert and oriented to person, place, and time. No cranial nerve deficit.  Skin: Skin is warm and dry.  Nursing note and vitals reviewed.   ED Course  Procedures (including critical care time)  Labs Review Labs Reviewed - No data to display  Imaging Review Dg Hand Complete Right  02/04/2016  CLINICAL DATA:  Pain caught between television and wall earlier today with pain and bruising EXAM: RIGHT HAND - COMPLETE  3+ VIEW COMPARISON:  None. FINDINGS: Frontal, oblique, and lateral views were obtained. There is mild soft tissue swelling medially. There is no demonstrable acute fracture or dislocation. The joint spaces appear normal. No erosive change. IMPRESSION: Mild soft tissue swelling medially. No fracture or dislocation. No apparent arthropathy. Electronically Signed   By: Lowella Grip III M.D.   On: 02/04/2016 15:37     Visual Acuity  Review  Right Eye Distance:   Left Eye Distance:   Bilateral Distance:    Right Eye Near:   Left Eye Near:    Bilateral Near:         MDM   1. Hand contusion, right, initial encounter    Wear the Ace wrap to the right hand for the next 3-4 days. He may remove it to perform mild range of motion, bathing and washing hands etc. Naprosyn for pain if needed. Cold compresses for swelling. If getting worse or having new symptoms or problems follow-up with the hand surgeon listed on page one.    Janne Napoleon, NP 02/04/16 1600

## 2016-02-04 NOTE — ED Notes (Signed)
Pt with bruising, swelling and pain to R hand since crushing it between a tv and wall last night. She has been applying ice. Pulses intact, skin w/d. Bruising noted to dorsal aspect of her R hand.

## 2016-06-09 ENCOUNTER — Encounter (HOSPITAL_COMMUNITY): Payer: Self-pay | Admitting: Emergency Medicine

## 2016-06-09 ENCOUNTER — Ambulatory Visit (HOSPITAL_COMMUNITY)
Admission: EM | Admit: 2016-06-09 | Discharge: 2016-06-09 | Disposition: A | Discharge status: Home / Self Care | Payer: Self-pay | Attending: Family Medicine | Admitting: Family Medicine

## 2016-06-09 DIAGNOSIS — H6501 Acute serous otitis media, right ear: Secondary | ICD-10-CM

## 2016-06-09 MED ORDER — AMOXICILLIN 500 MG PO CAPS: 500.0000 mg | ORAL_CAPSULE | Freq: Three times a day (TID) | ORAL | 0 refills | Status: DC

## 2016-06-09 MED ORDER — IPRATROPIUM BROMIDE 0.06 % NA SOLN: 2.0000 | Freq: Four times a day (QID) | NASAL | 1 refills | Status: DC

## 2016-06-09 NOTE — ED Triage Notes (Signed)
The patient presented to the [facility] with a complaint of head congestion and pain and decreased hearing in her right ear. The patient reported the congestion for 1 week and that her ear begin to hurt and lose hearing about 2 days ago. She reported using OTC medication with no relief.

## 2016-06-09 NOTE — Discharge Instructions (Signed)
Take all of medicine , use tylenol or advil for pain and fever as needed, see your doctor in 10 - 14 days for ear recheck if further problems.

## 2016-06-09 NOTE — ED Provider Notes (Signed)
[facility]    CSN: [id number] Arrival date & time: 06/09/16  1313     History   Chief Complaint Chief Complaint  Patient presents with  . Ear Fullness    HPI Wanda Crane is a 25 y.o. female.   The history is provided by the patient.  Ear Fullness  This is a new problem. The current episode started more than 1 week ago. The problem has been gradually worsening.    History reviewed. No pertinent past medical history.  There are no active problems to display for this patient.   History reviewed. No pertinent surgical history.  OB History    No data available       Home Medications    Prior to Admission medications   Medication Sig Start Date End Date Taking? Authorizing Provider  naproxen (NAPROSYN) 375 MG tablet Take 1 tablet (375 mg total) by mouth 2 (two) times daily. 02/04/16   Janne Napoleon, NP    Family History Family History  Problem Relation Age of Onset  . Asthma Mother   . COPD Mother   . Diabetes Maternal Grandmother   . Heart disease Maternal Grandmother   . Stroke Maternal Grandmother   . Diabetes Maternal Grandfather   . Heart disease Maternal Grandfather   . Stroke Maternal Grandfather     Social History Social History  Substance Use Topics  . Smoking status: Never Smoker  . Smokeless tobacco: Never Used  . Alcohol use No     Allergies   Review of patient's allergies indicates no known allergies.   Review of Systems Review of Systems  Constitutional: Negative.   HENT: Positive for congestion, ear pain, postnasal drip, rhinorrhea and sinus pressure.   Respiratory: Negative.   Cardiovascular: Negative.   Gastrointestinal: Negative.   All other systems reviewed and are negative.    Physical Exam Triage Vital Signs ED Triage Vitals  Enc Vitals Group     BP 06/09/16 1325 93/63     Pulse Rate 06/09/16 1325 81     Resp 06/09/16 1325 16     Temp 06/09/16 1325 98.5 F (36.9 C)     Temp Source 06/09/16 1325 Oral    SpO2 --      Weight --      Height --      Head Circumference --      Peak Flow --      Pain Score 06/09/16 1329 6     Pain Loc --      Pain Edu? --      Excl. in [place]? --    No data found.   Updated Vital Signs BP 93/63 (BP Location: Left Arm)   Pulse 81   Temp 98.5 F (36.9 C) (Oral)   Resp 16   LMP 05/26/2016 (Exact Date)   Visual Acuity Right Eye Distance:   Left Eye Distance:   Bilateral Distance:    Right Eye Near:   Left Eye Near:    Bilateral Near:     Physical Exam  Constitutional: She appears well-developed and well-nourished.  HENT:  Right Ear: External ear normal. No mastoid tenderness. Tympanic membrane is bulging. Tympanic membrane is not injected and not erythematous. A middle ear effusion is present.  Left Ear: External ear normal. No mastoid tenderness. Tympanic membrane is not injected, not erythematous and not bulging.  No middle ear effusion.  Mouth/Throat: Oropharynx is clear and moist.  Nursing note and vitals reviewed.    UC Treatments /  Results  Labs (all labs ordered are listed, but only abnormal results are displayed) Labs Reviewed - No data to display  EKG  EKG Interpretation None       Radiology No results found.  Procedures Procedures (including critical care time)  Medications Ordered in UC Medications - No data to display   Initial Impression / Assessment and Plan / UC Course  I have reviewed the triage vital signs and the nursing notes.  Pertinent labs & imaging results that were available during my care of the patient were reviewed by me and considered in my medical decision making (see chart for details).  Clinical Course      Final Clinical Impressions(s) / UC Diagnoses   Final diagnoses:  None    New Prescriptions New Prescriptions   No medications on file     Billy Fischer, MD 06/09/16 1344

## 2016-08-14 ENCOUNTER — Ambulatory Visit: Payer: Self-pay | Admitting: Family Medicine

## 2016-09-01 ENCOUNTER — Ambulatory Visit: Payer: Self-pay | Attending: Internal Medicine

## 2016-10-16 ENCOUNTER — Ambulatory Visit (INDEPENDENT_AMBULATORY_CARE_PROVIDER_SITE_OTHER): Payer: Self-pay | Admitting: Physician Assistant

## 2016-10-16 ENCOUNTER — Encounter (INDEPENDENT_AMBULATORY_CARE_PROVIDER_SITE_OTHER): Payer: Self-pay | Admitting: Physician Assistant

## 2016-10-16 VITALS — BP 92/63 | HR 63 | Temp 97.9°F | Ht 63.5 in | Wt 214.2 lb

## 2016-10-16 DIAGNOSIS — M4310 Spondylolisthesis, site unspecified: Secondary | ICD-10-CM

## 2016-10-16 LAB — POCT URINE PREGNANCY: Preg Test, Ur: NEGATIVE

## 2016-10-16 MED ORDER — GABAPENTIN 300 MG PO CAPS: 300.0000 mg | ORAL_CAPSULE | Freq: Three times a day (TID) | ORAL | 3 refills | Status: DC

## 2016-10-16 MED ORDER — ACETAMINOPHEN-CODEINE #3 300-30 MG PO TABS: 1.0000 | ORAL_TABLET | Freq: Every day | ORAL | 0 refills | Status: DC

## 2016-10-16 MED FILL — ACETAMINOPHEN/COD #3 TABLET: 300-30 | 7 days supply | Qty: 7 | Fill #0

## 2016-10-16 NOTE — Progress Notes (Signed)
Subjective:  Patient ID: Wanda Crane, female    DOB: 19-Sep-1990  Age: 26 y.o. MRN: [id number]  CC: back pain  HPI Wanda Crane is a 26 y.o. female with a PMH of Grade 1 Spondylolisthesis since approximately two years ago that presents with pain from the same that has been worsening over the last two months. There is also associated radiculopathy to the anterior RLE. States that she is unaware of what is causing her back pain and has not been to an orthopedist, chiropractor, or physical therapy for her back. She is not currently taking anything for pain. Denies saddle paresthesia, GI dysfunction, GU dysfunction, paralysis, weakness, fever, chills, nausea, vomiting, dysuria, or rash.   Outpatient Medications Prior to Visit  Medication Sig Dispense Refill  . amoxicillin (AMOXIL) 500 MG capsule Take 1 capsule (500 mg total) by mouth 3 (three) times daily. 30 capsule 0  . ipratropium (ATROVENT) 0.06 % nasal spray Place 2 sprays into both nostrils 4 (four) times daily. 15 mL 1  . naproxen (NAPROSYN) 375 MG tablet Take 1 tablet (375 mg total) by mouth 2 (two) times daily. 20 tablet 0   No facility-administered medications prior to visit.      ROS Review of Systems  Constitutional: Negative for chills, fever and malaise/fatigue.  Eyes: Negative for blurred vision.  Respiratory: Negative for shortness of breath.   Cardiovascular: Negative for chest pain, palpitations and leg swelling.  Gastrointestinal: Negative for abdominal pain and nausea.  Genitourinary: Negative for dysuria and hematuria.  Musculoskeletal: Positive for back pain. Negative for joint pain and myalgias.  Skin: Negative for rash.  Neurological: Negative for tingling and headaches.       Radiculopathy down RLE anteriorly  Psychiatric/Behavioral: Negative for depression. The patient is not nervous/anxious.     Objective:  BP 92/63 (BP Location: Left Arm, Patient Position: Sitting, Cuff Size: Large)   Pulse 63   Temp  97.9 F (36.6 C) (Oral)   Ht 5' 3.5" (1.613 m)   Wt 214 lb 3.2 oz (97.2 kg)   LMP 09/12/2016 (Exact Date)   SpO2 97%   BMI 37.35 kg/m   BP/Weight 10/16/2016 06/09/2016 12/23/8414  Systolic BP 92 93 606  Diastolic BP 63 63 75  Wt. (Lbs) 214.2 - -  BMI 37.35 - -      Physical Exam  Constitutional: She is oriented to person, place, and time.  Well developed, obese, NAD, polite  HENT:  Head: Normocephalic and atraumatic.  Eyes: No scleral icterus.  Neck: Normal range of motion. Neck supple. No thyromegaly present.  Cardiovascular: Normal rate, regular rhythm and normal heart sounds.   Pulmonary/Chest: Effort normal and breath sounds normal.  Abdominal: Soft. Bowel sounds are normal. There is no tenderness.  Musculoskeletal: She exhibits no edema.  Back with moderately limited extension. Pain elicited in the lumbar region with right hip flexion.   Neurological: She is alert and oriented to person, place, and time.  Skin: Skin is warm and dry. No rash noted. No erythema. No pallor.  Pilonidal hair tuft  Psychiatric: She has a normal mood and affect. Her behavior is normal. Thought content normal.  Vitals reviewed.    Assessment & Plan:   1. Spondylolisthesis, grade 1 - DG Lumbar Spine Complete - Ambulatory referral to Physical Therapy - POCT urine pregnancy negative (for xray)   Meds ordered this encounter  Medications  . gabapentin (NEURONTIN) 300 MG capsule    Sig: Take 1 capsule (300 mg total) by mouth  3 (three) times daily.    Dispense:  90 capsule    Refill:  3    Order Specific Question:   Supervising Provider    Answer:   Tresa Garter [id number]  . acetaminophen-codeine (TYLENOL #3) 300-30 MG tablet    Sig: Take 1 tablet by mouth at bedtime.    Dispense:  7 tablet    Refill:  0    Order Specific Question:   Supervising Provider    Answer:   Tresa Garter [id number]    Follow-up: Return in about 4 weeks (around 11/13/2016) for f/u back pain.    Clent Demark PA

## 2016-10-16 NOTE — Patient Instructions (Signed)
Spondylolisthesis Spondylolisthesis is when one of the bones in the spine (vertebrae) slips forward and out of place. This most commonly occurs in the lower back (lumbar spine), but it can happen anywhere along the spine. A vertebra may move out of place due to a previous back injury. In some cases, the injury may go unnoticed until the vertebra moves out of place later in life. Spondylolisthesis may also be caused by an injury or a condition that affects the bones. What are the causes? This condition may be caused by:  Injury (trauma). This is often a result of doing sports or physical activities that:  Put a lot of strain on the bones in the lower back.  Involve repetitive overstretching (hyperextension) of the spine.  A condition that affects the bones, such as osteoarthritis or cancer.  Changes in the spine that happen due to age-related wear and tear. What increases the risk? The following factors may make you more likely to develop this condition:  Participating in sports or activities that put a lot of strain on the lower back, including:  Gymnastics.  Figure skating.  Weight lifting.  Football.  Having a condition that affects the bones.  Being older than age 1. What are the signs or symptoms? Symptoms of this condition may include:  Mild to severe pain in the legs, lower back, or buttocks.  An abnormal way of walking (abnormal gait).  Poor posture.  Muscle stiffness, specifically in the hamstrings. The hamstrings are in the backs of the thighs.  Weakness, numbness, or a tingling sensation in the legs. Symptoms may get worse when standing, and they may temporarily get better when sitting down or bending forward. In some cases, there may be no symptoms of this condition. How is this diagnosed?   This condition may be diagnosed based on:  Your symptoms.  Your medical history.  A physical exam.  Your health care provider may push on certain areas to  determine the source of your pain.  You may be asked to bend forward, backward, and side to side so your health care provider can assess the severity of your pain and your range of motion.  Imaging tests, such as:  X-rays.  CT scan.  MRI. How is this treated? Treatment for this condition may include:  Resting. This may involve avoiding or modifying activities that put strain on your back until your symptoms improve.  Medicines to help relieve pain.  NSAIDs to help reduce swelling and discomfort.  Injections of medicine (cortisone) in your back. These injections can to help relieve pain and numbness.  A brace to stabilize and support your back.  Physical therapy. This may include working with an occupational therapist or physical therapist who can teach you how to reduce pressure on your back while you do everyday activities.  Surgery. This may be needed if:  Other treatment methods do not improve your condition.  Your symptoms do not go away after 3-6 months.  You are unable to walk or stand.  You have severe pain. Follow these instructions at home: If you have a brace:   Wear it as told by your health care provider. Remove it only as told by your health care provider.  Do not let your brace get wet if it is not waterproof.  Keep the brace clean. Driving   Do not drive or operate heavy machinery until you know how your pain medicine affects you.  Ask your health care provider when it is safe  to drive if you have a back brace. Activity   Rest and return to your normal activities as told by your health care provider. Ask your health care provider what activities are safe for you.  Avoid activities that take a lot of effort (are strenuous) for as long as told by your health care provider.  Do exercises as told by your health care provider. General instructions   Take over-the-counter and prescription medicines only as told by your health care provider.  If you  have questions or concerns about safety while taking pain medicine, talk with your health care provider.  Do not use any tobacco products, such as cigarettes, chewing tobacco, and e-cigarettes. Tobacco can delay bone healing. If you need help quitting, ask your health care provider.  Keep all follow-up visits as told by your health care provider. This is important. How is this prevented?  Warm up and stretch before being active.  Cool down and stretch after being active.  Give your body time to rest between periods of activity.  Make sure to use equipment that fits you.  Be safe and responsible while being active to avoid falls.  Do at least 150 minutes of moderate-intensity exercise each week, such as brisk walking or water aerobics.  Maintain physical fitness, including:  Strength.  Flexibility.  Cardiovascular fitness.  Endurance. Contact a health care provider if:  You have pain that gets worse or does not get better. Get help right away if:  You have severe back pain.  You develop weakness or numbness in your legs.  You are unable to stand or walk. This information is not intended to replace advice given to you by your health care provider. Make sure you discuss any questions you have with your health care provider. Document Released: 07/24/2005 Document Revised: 03/30/2016 Document Reviewed: 05/04/2015 Elsevier Interactive Patient Education  2017 [employer].

## 2016-10-17 ENCOUNTER — Telehealth (INDEPENDENT_AMBULATORY_CARE_PROVIDER_SITE_OTHER): Payer: Self-pay | Admitting: Physician Assistant

## 2016-10-17 NOTE — Telephone Encounter (Signed)
Sent to PCP for advising. Nat Christen, CMA

## 2016-10-17 NOTE — Telephone Encounter (Signed)
Patient came in requesting referral to dental office  Patient also requesting letter stating back problems and that she currently can not work,  Needs it to get or apply for food stamps    Please follow up with patient at [phone]

## 2016-10-17 NOTE — Telephone Encounter (Signed)
Please tell patient that she has to get her x ray done. Sorry, but I will not rewrite the work restrictions until I can see the xray report. Will have to schedule to be seen for dental issue.

## 2016-10-18 NOTE — Telephone Encounter (Signed)
Left message for patient to call office back. Nat Christen, CMA

## 2016-10-18 NOTE — Telephone Encounter (Signed)
Informed patient that she would need to have xray done prior to pcp changing work restrictions and that she would need to schedule an appointment to discuss her need of a dental referral. Nat Christen, CMA

## 2016-10-20 ENCOUNTER — Ambulatory Visit (INDEPENDENT_AMBULATORY_CARE_PROVIDER_SITE_OTHER): Payer: Self-pay

## 2016-10-23 ENCOUNTER — Ambulatory Visit: Payer: Self-pay | Admitting: Physical Therapy

## 2016-10-30 ENCOUNTER — Ambulatory Visit: Payer: Self-pay | Attending: Physical Therapy | Admitting: Physical Therapy

## 2016-11-13 ENCOUNTER — Ambulatory Visit (INDEPENDENT_AMBULATORY_CARE_PROVIDER_SITE_OTHER): Payer: Self-pay | Admitting: Physician Assistant

## 2016-11-13 ENCOUNTER — Encounter (INDEPENDENT_AMBULATORY_CARE_PROVIDER_SITE_OTHER): Payer: Self-pay | Admitting: Physician Assistant

## 2016-11-13 ENCOUNTER — Ambulatory Visit (HOSPITAL_COMMUNITY)
Admission: RE | Admit: 2016-11-13 | Discharge: 2016-11-13 | Disposition: A | Discharge status: Home / Self Care | Payer: Self-pay | Source: Ambulatory Visit | Attending: Physician Assistant | Admitting: Physician Assistant

## 2016-11-13 VITALS — BP 98/67 | HR 62 | Temp 98.1°F | Ht 63.0 in | Wt 214.6 lb

## 2016-11-13 DIAGNOSIS — M5137 Other intervertebral disc degeneration, lumbosacral region: Secondary | ICD-10-CM | POA: Insufficient documentation

## 2016-11-13 DIAGNOSIS — M4310 Spondylolisthesis, site unspecified: Secondary | ICD-10-CM

## 2016-11-13 DIAGNOSIS — Q760 Spina bifida occulta: Secondary | ICD-10-CM | POA: Insufficient documentation

## 2016-11-13 DIAGNOSIS — M4316 Spondylolisthesis, lumbar region: Secondary | ICD-10-CM | POA: Insufficient documentation

## 2016-11-13 MED ORDER — PREDNISONE 20 MG PO TABS: 40.0000 mg | ORAL_TABLET | Freq: Every day | ORAL | 0 refills | Status: DC

## 2016-11-13 MED ORDER — GABAPENTIN 600 MG PO TABS: 600.0000 mg | ORAL_TABLET | Freq: Three times a day (TID) | ORAL | 1 refills | Status: DC

## 2016-11-13 MED ORDER — TRAMADOL HCL 50 MG PO TABS: 50.0000 mg | ORAL_TABLET | Freq: Three times a day (TID) | ORAL | 0 refills | Status: DC | PRN

## 2016-11-13 MED FILL — predniSONE 20 MG TABS: 20 | 6 days supply | Qty: 12 | Fill #0

## 2016-11-13 MED FILL — traMADol HCL 50 MG TABS: 50 | 10 days supply | Qty: 30 | Fill #0

## 2016-11-13 MED FILL — GABAPENTIN 600 MG TABLET: 600 | 30 days supply | Qty: 90 | Fill #0

## 2016-11-13 NOTE — Progress Notes (Signed)
Subjective:  Patient ID: Wanda Crane, female    DOB: October 29, 1990  Age: 26 y.o. MRN: [id number]  CC:  f/u spondylolisthesis  HPI Wanda Crane is a 26 y.o. female with a PMH of grade 1 spondylolisthesis L5- S1 diagnosed on 08/2014. She was seen here nearly on month ago. Was prescribed Tylenol #3 with helped minimally and Gabapent 300 mg TID which did not help at all. Had XR ordered but did not go to have it done. Had PT ordered but only went to one session. Pt continues with Lumbar pain that is aggravated with certain positions (seems to be extension and left lateral flexion) and with standing/walking for prolonged periods. Tingling RLE with associated burning to the knee and sometimes to the toes. Occasional left sided symptoms similar to right side but depends on position, length of standing/sitting.     Review of Systems  Constitutional: Negative for chills, fever and malaise/fatigue.  Eyes: Negative for blurred vision.  Respiratory: Negative for shortness of breath.   Cardiovascular: Negative for chest pain and palpitations.  Gastrointestinal: Negative for abdominal pain and nausea.  Genitourinary: Negative for dysuria and hematuria.  Musculoskeletal: Positive for back pain. Negative for joint pain and myalgias.  Skin: Negative for rash.  Neurological: Positive for tingling. Negative for headaches.  Psychiatric/Behavioral: Negative for depression. The patient is not nervous/anxious.     Objective:  BP 98/67 (BP Location: Left Arm, Patient Position: Sitting, Cuff Size: Large)   Pulse 62   Temp 98.1 F (36.7 C) (Oral)   Ht 5\' 3"  (1.6 m)   Wt 214 lb 9.6 oz (97.3 kg)   LMP 11/05/2016 (Exact Date)   SpO2 98%   BMI 38.01 kg/m   BP/Weight 11/13/2016 10/16/2016 40/04/8118  Systolic BP 98 92 93  Diastolic BP 67 63 63  Wt. (Lbs) 214.6 214.2 -  BMI 38.01 37.35 -      Physical Exam  Constitutional: She is oriented to person, place, and time.  Well developed, obese, NAD, reserved   HENT:  Head: Normocephalic and atraumatic.  Eyes: No scleral icterus.  Neck: Normal range of motion.  Cardiovascular: Normal rate, regular rhythm and normal heart sounds.   Pulmonary/Chest: Effort normal and breath sounds normal.  Musculoskeletal: She exhibits no edema.  Back with full aROM, pain elicited on extension and left lateral flexion.  Neurological: She is alert and oriented to person, place, and time. No cranial nerve deficit. Coordination normal.  Skin: Skin is warm and dry. No rash noted. No erythema. No pallor.  Psychiatric: She has a normal mood and affect. Her behavior is normal. Thought content normal.  Vitals reviewed.    Assessment & Plan:   1. Spondylolisthesis, grade 1 - traMADol (ULTRAM) 50 MG tablet; Take 1 tablet (50 mg total) by mouth every 8 (eight) hours as needed.  Dispense: 30 tablet; Refill: 0 - predniSONE (DELTASONE) 20 MG tablet; Take 2 tablets (40 mg total) by mouth daily with breakfast.  Dispense: 12 tablet; Refill: 0 - gabapentin (NEURONTIN) 600 MG tablet; Take 1 tablet (600 mg total) by mouth 3 (three) times daily.  Dispense: 90 tablet; Refill: 1   Meds ordered this encounter  Medications  . traMADol (ULTRAM) 50 MG tablet    Sig: Take 1 tablet (50 mg total) by mouth every 8 (eight) hours as needed.    Dispense:  30 tablet    Refill:  0    Order Specific Question:   Supervising Provider    Answer:   Angelica Chessman  E [[id number]]  . predniSONE (DELTASONE) 20 MG tablet    Sig: Take 2 tablets (40 mg total) by mouth daily with breakfast.    Dispense:  12 tablet    Refill:  0    Order Specific Question:   Supervising Provider    Answer:   Tresa Garter [id number]  . gabapentin (NEURONTIN) 600 MG tablet    Sig: Take 1 tablet (600 mg total) by mouth 3 (three) times daily.    Dispense:  90 tablet    Refill:  1    Order Specific Question:   Supervising Provider    Answer:   Tresa Garter [id number]    Follow-up: Return in about 2  weeks (around 11/27/2016) for back pain.   Clent Demark PA

## 2016-11-13 NOTE — Patient Instructions (Signed)
Please get your X-ray done today. This is very important to help you progress with proper treatment of your back. Please go back to physical therapy. Call them to schedule another appointment. There may be different treatment modalities that may be used to treat your back pain at physical therapy.  Spondylolisthesis Spondylolisthesis is when one of the bones in the spine (vertebrae) slips forward and out of place. This most commonly occurs in the lower back (lumbar spine), but it can happen anywhere along the spine. A vertebra may move out of place due to a previous back injury. In some cases, the injury may go unnoticed until the vertebra moves out of place later in life. Spondylolisthesis may also be caused by an injury or a condition that affects the bones. What are the causes? This condition may be caused by:  Injury (trauma). This is often a result of doing sports or physical activities that:  Put a lot of strain on the bones in the lower back.  Involve repetitive overstretching (hyperextension) of the spine.  A condition that affects the bones, such as osteoarthritis or cancer.  Changes in the spine that happen due to age-related wear and tear. What increases the risk? The following factors may make you more likely to develop this condition:  Participating in sports or activities that put a lot of strain on the lower back, including:  Gymnastics.  Figure skating.  Weight lifting.  Football.  Having a condition that affects the bones.  Being older than age 33. What are the signs or symptoms? Symptoms of this condition may include:  Mild to severe pain in the legs, lower back, or buttocks.  An abnormal way of walking (abnormal gait).  Poor posture.  Muscle stiffness, specifically in the hamstrings. The hamstrings are in the backs of the thighs.  Weakness, numbness, or a tingling sensation in the legs. Symptoms may get worse when standing, and they may temporarily get  better when sitting down or bending forward. In some cases, there may be no symptoms of this condition. How is this diagnosed?   This condition may be diagnosed based on:  Your symptoms.  Your medical history.  A physical exam.  Your health care provider may push on certain areas to determine the source of your pain.  You may be asked to bend forward, backward, and side to side so your health care provider can assess the severity of your pain and your range of motion.  Imaging tests, such as:  X-rays.  CT scan.  MRI. How is this treated? Treatment for this condition may include:  Resting. This may involve avoiding or modifying activities that put strain on your back until your symptoms improve.  Medicines to help relieve pain.  NSAIDs to help reduce swelling and discomfort.  Injections of medicine (cortisone) in your back. These injections can to help relieve pain and numbness.  A brace to stabilize and support your back.  Physical therapy. This may include working with an occupational therapist or physical therapist who can teach you how to reduce pressure on your back while you do everyday activities.  Surgery. This may be needed if:  Other treatment methods do not improve your condition.  Your symptoms do not go away after 3-6 months.  You are unable to walk or stand.  You have severe pain. Follow these instructions at home: If you have a brace:   Wear it as told by your health care provider. Remove it only as told by  your health care provider.  Do not let your brace get wet if it is not waterproof.  Keep the brace clean. Driving   Do not drive or operate heavy machinery until you know how your pain medicine affects you.  Ask your health care provider when it is safe to drive if you have a back brace. Activity   Rest and return to your normal activities as told by your health care provider. Ask your health care provider what activities are safe for  you.  Avoid activities that take a lot of effort (are strenuous) for as long as told by your health care provider.  Do exercises as told by your health care provider. General instructions   Take over-the-counter and prescription medicines only as told by your health care provider.  If you have questions or concerns about safety while taking pain medicine, talk with your health care provider.  Do not use any tobacco products, such as cigarettes, chewing tobacco, and e-cigarettes. Tobacco can delay bone healing. If you need help quitting, ask your health care provider.  Keep all follow-up visits as told by your health care provider. This is important. How is this prevented?  Warm up and stretch before being active.  Cool down and stretch after being active.  Give your body time to rest between periods of activity.  Make sure to use equipment that fits you.  Be safe and responsible while being active to avoid falls.  Do at least 150 minutes of moderate-intensity exercise each week, such as brisk walking or water aerobics.  Maintain physical fitness, including:  Strength.  Flexibility.  Cardiovascular fitness.  Endurance. Contact a health care provider if:  You have pain that gets worse or does not get better. Get help right away if:  You have severe back pain.  You develop weakness or numbness in your legs.  You are unable to stand or walk. This information is not intended to replace advice given to you by your health care provider. Make sure you discuss any questions you have with your health care provider. Document Released: 07/24/2005 Document Revised: 03/30/2016 Document Reviewed: 05/04/2015 Elsevier Interactive Patient Education  2017 [employer].

## 2016-11-13 NOTE — Progress Notes (Signed)
Pt presents for a FU for back pain  Pt states her pain today is at an 8.5

## 2016-11-27 ENCOUNTER — Telehealth (INDEPENDENT_AMBULATORY_CARE_PROVIDER_SITE_OTHER): Payer: Self-pay | Admitting: Physician Assistant

## 2016-11-27 ENCOUNTER — Encounter (INDEPENDENT_AMBULATORY_CARE_PROVIDER_SITE_OTHER): Payer: Self-pay | Admitting: Physician Assistant

## 2016-11-27 ENCOUNTER — Ambulatory Visit (INDEPENDENT_AMBULATORY_CARE_PROVIDER_SITE_OTHER): Payer: Self-pay | Admitting: Physician Assistant

## 2016-11-27 VITALS — BP 114/78 | HR 69 | Temp 98.2°F | Ht 63.0 in | Wt 214.8 lb

## 2016-11-27 DIAGNOSIS — M4310 Spondylolisthesis, site unspecified: Secondary | ICD-10-CM

## 2016-11-27 HISTORY — DX: Spondylolisthesis, site unspecified: M43.10

## 2016-11-27 MED ORDER — TRAMADOL HCL 50 MG PO TABS: 50.0000 mg | ORAL_TABLET | Freq: Three times a day (TID) | ORAL | 0 refills | Status: DC | PRN

## 2016-11-27 MED ORDER — GABAPENTIN 600 MG PO TABS: 600.0000 mg | ORAL_TABLET | ORAL | 1 refills | Status: DC

## 2016-11-27 MED FILL — traMADol HCL 50 MG TABS: 50 | 10 days supply | Qty: 30 | Fill #0

## 2016-11-27 NOTE — Telephone Encounter (Signed)
Have patient pick up note. I have placed it on Wanda Crane's desk.

## 2016-11-27 NOTE — Telephone Encounter (Signed)
FWD to PCP. Naylin Burkle S Jamila Slatten, CMA  

## 2016-11-27 NOTE — Telephone Encounter (Signed)
Patient came stated needs letter for [employer] stated not able to work due to back injury.  Please follow with patient [phone]

## 2016-11-27 NOTE — Patient Instructions (Signed)
Tramadol tablets What is this medicine? TRAMADOL (TRA ma dole) is a pain reliever. It is used to treat moderate to severe pain in adults. This medicine may be used for other purposes; ask your health care provider or pharmacist if you have questions. COMMON BRAND NAME(S): Ultram What should I tell my health care provider before I take this medicine? They need to know if you have any of these conditions: -brain tumor -depression -drug abuse or addiction -head injury -if you frequently drink alcohol containing drinks -kidney disease or trouble passing urine -liver disease -lung disease, asthma, or breathing problems -seizures or epilepsy -suicidal thoughts, plans, or attempt; a previous suicide attempt by you or a family member -an unusual or allergic reaction to tramadol, codeine, other medicines, foods, dyes, or preservatives -pregnant or trying to get pregnant -breast-feeding How should I use this medicine? Take this medicine by mouth with a full glass of water. Follow the directions on the prescription label. You can take it with or without food. If it upsets your stomach, take it with food. Do not take your medicine more often than directed. A special MedGuide will be given to you by the pharmacist with each prescription and refill. Be sure to read this information carefully each time. Talk to your pediatrician regarding the use of this medicine in children. Special care may be needed. Overdosage: If you think you have taken too much of this medicine contact a poison control center or emergency room at once. NOTE: This medicine is only for you. Do not share this medicine with others. What if I miss a dose? If you miss a dose, take it as soon as you can. If it is almost time for your next dose, take only that dose. Do not take double or extra doses. What may interact with this medicine? Do not take this medication with any of the following medicines: -MAOIs like Carbex, Eldepryl,  Marplan, Nardil, and Parnate This medicine may also interact with the following medications: -alcohol -antihistamines for allergy, cough and cold -certain medicines for anxiety or sleep -certain medicines for depression like amitriptyline, fluoxetine, sertraline -certain medicines for migraine headache like almotriptan, eletriptan, frovatriptan, naratriptan, rizatriptan, sumatriptan, zolmitriptan -certain medicines for seizures like carbamazepine, oxcarbazepine, phenobarbital, primidone -certain medicines that treat or prevent blood clots like warfarin -digoxin -furazolidone -general anesthetics like halothane, isoflurane, methoxyflurane, propofol -linezolid -local anesthetics like lidocaine, pramoxine, tetracaine -medicines that relax muscles for surgery -other narcotic medicines for pain or cough -phenothiazines like chlorpromazine, mesoridazine, prochlorperazine, thioridazine -procarbazine This list may not describe all possible interactions. Give your health care provider a list of all the medicines, herbs, non-prescription drugs, or dietary supplements you use. Also tell them if you smoke, drink alcohol, or use illegal drugs. Some items may interact with your medicine. What should I watch for while using this medicine? Tell your doctor or health care professional if your pain does not go away, if it gets worse, or if you have new or a different type of pain. You may develop tolerance to the medicine. Tolerance means that you will need a higher dose of the medicine for pain relief. Tolerance is normal and is expected if you take this medicine for a long time. Do not suddenly stop taking your medicine because you may develop a severe reaction. Your body becomes used to the medicine. This does NOT mean you are addicted. Addiction is a behavior related to getting and using a drug for a non-medical reason. If you have pain, you  have a medical reason to take pain medicine. Your doctor will tell  you how much medicine to take. If your doctor wants you to stop the medicine, the dose will be slowly lowered over time to avoid any side effects. There are different types of narcotic medicines (opiates). If you take more than one type at the same time or if you are taking another medicine that also causes drowsiness, you may have more side effects. Give your health care provider a list of all medicines you use. Your doctor will tell you how much medicine to take. Do not take more medicine than directed. Call emergency for help if you have problems breathing or unusual sleepiness. You may get drowsy or dizzy. Do not drive, use machinery, or do anything that needs mental alertness until you know how this medicine affects you. Do not stand or sit up quickly, especially if you are an older patient. This reduces the risk of dizzy or fainting spells. Alcohol can increase or decrease the effects of this medicine. Avoid alcoholic drinks. You may have constipation. Try to have a bowel movement at least every 2 to 3 days. If you do not have a bowel movement for 3 days, call your doctor or health care professional. Your mouth may get dry. Chewing sugarless gum or sucking hard candy, and drinking plenty of water may help. Contact your doctor if the problem does not go away or is severe. What side effects may I notice from receiving this medicine? Side effects that you should report to your doctor or health care professional as soon as possible: -allergic reactions like skin rash, itching or hives, swelling of the face, lips, or tongue -breathing problems -confusion -seizures -signs and symptoms of low blood pressure like dizziness; feeling faint or lightheaded, falls; unusually weak or tired -trouble passing urine or change in the amount of urine Side effects that usually do not require medical attention (report to your doctor or health care professional if they continue or are bothersome): -constipation -dry  mouth -nausea, vomiting -tiredness This list may not describe all possible side effects. Call your doctor for medical advice about side effects. You may report side effects to FDA at 1-800-FDA-1088. Where should I keep my medicine? Keep out of the reach of children. This medicine may cause accidental overdose and death if it taken by other adults, children, or pets. Mix any unused medicine with a substance like cat litter or coffee grounds. Then throw the medicine away in a sealed container like a sealed bag or a coffee can with a lid. Do not use the medicine after the expiration date. Store at room temperature between 15 and 30 degrees C (59 and 86 degrees F). NOTE: This sheet is a summary. It may not cover all possible information. If you have questions about this medicine, talk to your doctor, pharmacist, or health care provider.  2018 Elsevier/Gold Standard (2015-04-18 09:00:04)  

## 2016-11-27 NOTE — Progress Notes (Signed)
Subjective:  Patient ID: Wanda Crane, female    DOB: 03-09-1991  Age: 26 y.o. MRN: [id number]  CC:   HPI Inanna Telford is a 26 y.o. female presents on f/u of Grade 1 spondylolisthesis. Says the combination of Prednisone, Gabapentin, and Tramadol helped drastically reduce the pain and radiculopathy. Reports approximately 40% reduction in radiculopathy with Gabapentin. 20% reduction in back pain with Tramadol. Has attended only one session of physical therapy. Did not have more sessions because they did not call back and she had to help her mother in [place]. Says her mother does not drive and had to be chauffeured to her medical appointments. Says her current back pain is 9/10. She is interested in going to pain clinic.    Outpatient Medications Prior to Visit  Medication Sig Dispense Refill  . gabapentin (NEURONTIN) 600 MG tablet Take 1 tablet (600 mg total) by mouth 3 (three) times daily. 90 tablet 1  . predniSONE (DELTASONE) 20 MG tablet Take 2 tablets (40 mg total) by mouth daily with breakfast. 12 tablet 0  . traMADol (ULTRAM) 50 MG tablet Take 1 tablet (50 mg total) by mouth every 8 (eight) hours as needed. 30 tablet 0   No facility-administered medications prior to visit.      ROS Review of Systems  Constitutional: Negative for chills, fever and malaise/fatigue.  Eyes: Negative for blurred vision.  Respiratory: Negative for shortness of breath.   Cardiovascular: Negative for chest pain and palpitations.  Gastrointestinal: Negative for abdominal pain and nausea.  Genitourinary: Negative for dysuria and hematuria.  Musculoskeletal: Positive for back pain. Negative for joint pain and myalgias.  Skin: Negative for rash.  Neurological: Negative for tingling and headaches.  Psychiatric/Behavioral: Negative for depression. The patient is not nervous/anxious.     Objective:  BP 114/78 (BP Location: Left Arm, Patient Position: Sitting, Cuff Size: Large)   Pulse 69   Temp 98.2 F  (36.8 C) (Oral)   Ht 5\' 3"  (1.6 m)   Wt 214 lb 12.8 oz (97.4 kg)   LMP 11/05/2016 (Exact Date)   SpO2 96%   BMI 38.05 kg/m   BP/Weight 11/27/2016 11/13/2016 3/61/4431  Systolic BP 540 98 92  Diastolic BP 78 67 63  Wt. (Lbs) 214.8 214.6 214.2  BMI 38.05 38.01 37.35      Physical Exam  Constitutional: She is oriented to person, place, and time.  Well developed, obese, NAD, reported current pain level out of proportion to body language and visually perceived pain level  HENT:  Head: Normocephalic and atraumatic.  Eyes: No scleral icterus.  Neck: Normal range of motion.  Cardiovascular: Normal rate, regular rhythm and normal heart sounds.   Pulmonary/Chest: Effort normal and breath sounds normal.  Musculoskeletal: She exhibits no edema.  ROM of the back not assessed due to patient's stated pain level. General movement of sitting, standing, and walking unremarkable. Torso not tilted, not hunched forward, no gait impairment.   Neurological: She is alert and oriented to person, place, and time. No cranial nerve deficit.  Skin: Skin is warm and dry. No rash noted. No erythema. No pallor.  Psychiatric: She has a normal mood and affect. Her behavior is normal. Thought content normal.  Vitals reviewed.    Assessment & Plan:   1. Spondylolisthesis, grade 1 - traMADol (ULTRAM) 50 MG tablet; Take 1 tablet (50 mg total) by mouth every 8 (eight) hours as needed.  Dispense: 30 tablet; Refill: 0 - gabapentin (NEURONTIN) 600 MG tablet; Take 1 tablet (  600 mg total) by mouth as directed. Take one 600mg  in a.m., on 600 mg in afternoon, and two 600mg  qhs  Dispense: 120 tablet; Refill: 1 - Ambulatory referral to Pain Clinic   Meds ordered this encounter  Medications  . traMADol (ULTRAM) 50 MG tablet    Sig: Take 1 tablet (50 mg total) by mouth every 8 (eight) hours as needed.    Dispense:  30 tablet    Refill:  0    Order Specific Question:   Supervising Provider    Answer:   Tresa Garter [id number]  . gabapentin (NEURONTIN) 600 MG tablet    Sig: Take 1 tablet (600 mg total) by mouth as directed. Take one 600mg  in a.m., on 600 mg in afternoon, and two 600mg  qhs    Dispense:  120 tablet    Refill:  1    Order Specific Question:   Supervising Provider    Answer:   Tresa Garter [[id number]]    Follow-up: Return in about 4 weeks (around 12/25/2016) for back pain.   Clent Demark PA

## 2016-11-28 ENCOUNTER — Ambulatory Visit: Payer: Self-pay | Attending: Physical Therapy

## 2016-11-28 DIAGNOSIS — M4316 Spondylolisthesis, lumbar region: Secondary | ICD-10-CM | POA: Insufficient documentation

## 2016-11-28 DIAGNOSIS — M545 Low back pain: Secondary | ICD-10-CM | POA: Insufficient documentation

## 2016-11-28 DIAGNOSIS — R293 Abnormal posture: Secondary | ICD-10-CM | POA: Insufficient documentation

## 2016-11-28 DIAGNOSIS — G8929 Other chronic pain: Secondary | ICD-10-CM | POA: Insufficient documentation

## 2016-11-28 DIAGNOSIS — M62830 Muscle spasm of back: Secondary | ICD-10-CM | POA: Insufficient documentation

## 2016-11-28 NOTE — Therapy (Signed)
[place], [place], [address] Phone: [phone]   Fax:  [phone]  Physical Therapy Evaluation  Patient Details  Name: Makelle Marrone MRN: [id number] Date of Birth: 02/25/1991 Referring Provider: Domenica Fail ,PA  Encounter Date: 11/28/2016      PT End of Session - 11/28/16 1632    Visit Number 1   Number of Visits 12   Date for PT Re-Evaluation 01/05/17   Authorization Type CAFA   PT Start Time 0215   PT Stop Time 0300   PT Time Calculation (min) 45 min   Activity Tolerance Patient tolerated treatment well;No increased pain   Behavior During Therapy [facility] for tasks assessed/performed      History reviewed. No pertinent past medical history.  History reviewed. No pertinent surgical history.  There were no vitals filed for this visit.       Subjective Assessment - 11/28/16 1422    Subjective She reports lower back pain. 2 years ago started.  No injury reported.  She reports spondylolisthesis.      Limitations Lifting;Standing  bending .    How long can you sit comfortably? 25 -30 min   How long can you stand comfortably? 25-30 min   How long can you walk comfortably? 200 feet    Diagnostic tests Xray: spondylolisthesis   Patient Stated Goals She wants to get better.  To take some pain away.    Currently in Pain? Yes   Pain Score 7    Pain Location Back   Pain Orientation Lower;Right   Pain Descriptors / Indicators Burning;Shooting  pulling   Pain Type Chronic pain   Pain Onset More than a month ago   Pain Frequency Constant   Aggravating Factors  Standing , dishes , repetative  bending.    Pain Relieving Factors lying flat on back, medication,    Multiple Pain Sites No            [facility] PT Assessment - 11/28/16 0001      Assessment   Medical Diagnosis lumbar spondylolisthesis   Referring Provider Domenica Fail ,PA   Onset Date/Surgical Date --  2 years ago   Hand Dominance Left    Next MD Visit A month   Prior Therapy None     Precautions   Precautions None     Restrictions   Weight Bearing Restrictions No     Balance Screen   Has the patient fallen in the past 6 months No   Has the patient had a decrease in activity level because of a fear of falling?  No   Is the patient reluctant to leave their home because of a fear of falling?  No     Prior Function   Level of Independence Needs assistance with ADLs;Needs assistance with homemaking   Vocation Unemployed     Cognition   Overall Cognitive Status Within Functional Limits for tasks assessed     Observation/Other Assessments   Focus on Therapeutic Outcomes (FOTO)  74% limited     Posture/Postural Control   Posture Comments incr lumbar lordosis, decr weight to RT leg . RT ilia higher than Lt      ROM / Strength   AROM / PROM / Strength AROM;Strength;PROM     AROM   AROM Assessment Site Lumbar   Lumbar Flexion 60   Lumbar Extension 20  pain more than flex   Lumbar - Right Side Bend 20   Lumbar - Left  Side Bend 20   Lumbar - Right Rotation 30   Lumbar - Left Rotation 30     PROM   Overall PROM Comments + Obers on RT .  Decreased knee flexion  hip IR ,    PROM Assessment Site Hip   Right/Left Hip Right;Left   Right Hip Internal Rotation  20   Left Hip Extension 10  RT hip ext  8 degrees   Left Hip Internal Rotation  20     Strength   Overall Strength Comments WNL except hip ext and abduction 4/5  and poor abdominal control/      Flexibility   Soft Tissue Assessment /Muscle Length yes   Hamstrings 45 degrees SLR     Palpation   Palpation comment Tender lower lumbar spine, Shorter LT leg     Ambulation/Gait   Gait Comments WNL                           PT Education - 11/28/16 1632    Education provided Yes   Education Details POC , issued heel lift LT foot and management of wear time 2-4 hours in and out tme to adjust to lift or to stop if irritating    Person(s) Educated Patient   Methods Explanation   Comprehension Verbalized understanding          PT Short Term Goals - 11/28/16 1635      PT SHORT TERM GOAL #1   Title She will be independent with inital HEP   Time 3   Period Weeks     PT SHORT TERM GOAL #2   Title She will report pain decreased 30% or more with walking and standing   Time 3   Period Weeks   Status New     PT SHORT TERM GOAL #3   Title She will be able to demo good sitting posture and understanding of posterior pelvic tilt in standing  to decr lordosis   Time 3   Period Weeks   Status New           PT Long Term Goals - 11/28/16 1636      PT LONG TERM GOAL #1   Title She will be independent with all HEP issued   Time 6   Period Weeks   Status New     PT LONG TERM GOAL #2   Title She will be able to demo understanding of good posture and mechanics to decrese stress to lower back   Time 6   Period Weeks   Status New     PT LONG TERM GOAL #3   Title She will report pain decreased    to max 2-3 with sitting for 60 min   Time 6   Period Weeks   Status New     PT LONG TERM GOAL #4   Title She will report pain no > 2-3 with sitting for . 60 min   Time 6   Period Weeks   Status New     PT LONG TERM GOAL #5   Title She will be able to walk for shopping 60 min with pain 2-3/10 max   Time 6   Period Weeks   Status New               Plan - 11/28/16 1633    Clinical Impression Statement Ms Vollmer presents for low complexity eval for chronic LBP RT side.  She reports onset without injury. She presents with stiffness of spine , leg length discrepency, poor core strength , stiffnes of RT hip   Rehab Potential Good   Clinical Impairments Affecting Rehab Potential  chronicity of problem   PT Frequency 2x / week   PT Duration 6 weeks   PT Treatment/Interventions Electrical Stimulation;Iontophoresis 4mg /ml Dexamethasone;Moist Heat;Passive range of motion;Patient/family education;Manual  techniques;Therapeutic exercise;Dry needling   PT Next Visit Plan Core strength, modalities , manual   Consulted and Agree with Plan of Care Patient      Patient will benefit from skilled therapeutic intervention in order to improve the following deficits and impairments:  Decreased range of motion, Pain, Decreased activity tolerance, Postural dysfunction, Decreased strength, Increased muscle spasms  Visit Diagnosis: Spondylolisthesis of lumbar region  Chronic right-sided low back pain, with sciatica presence unspecified  Muscle spasm of back  Abnormal posture     Problem List Patient Active Problem List   Diagnosis Date Noted  . Spondylolisthesis, grade 1 11/27/2016    Darrel Hoover  PT 11/28/2016, 4:58 PM  [facility] [address] [place], [place], [address] Phone: [phone]   Fax:  [phone]  Name: Adileny Delon MRN: [id number] Date of Birth: 30-Apr-1991

## 2016-11-30 ENCOUNTER — Ambulatory Visit: Payer: Self-pay | Admitting: Physical Therapy

## 2016-11-30 NOTE — Telephone Encounter (Signed)
I have called patient several times. Will mail the letter to the patient. Nat Christen, CMA

## 2016-12-01 ENCOUNTER — Telehealth: Payer: Self-pay | Admitting: Physical Therapy

## 2016-12-01 NOTE — Telephone Encounter (Signed)
Left message regarding patient's missed appointment. Left future appointment time on voicemail and asked her to call to cancel or reschedule if needed.

## 2016-12-05 ENCOUNTER — Ambulatory Visit: Payer: Self-pay | Attending: Physical Therapy

## 2016-12-05 ENCOUNTER — Telehealth: Payer: Self-pay

## 2016-12-05 DIAGNOSIS — M4316 Spondylolisthesis, lumbar region: Secondary | ICD-10-CM | POA: Insufficient documentation

## 2016-12-05 DIAGNOSIS — G8929 Other chronic pain: Secondary | ICD-10-CM | POA: Insufficient documentation

## 2016-12-05 DIAGNOSIS — M62830 Muscle spasm of back: Secondary | ICD-10-CM | POA: Insufficient documentation

## 2016-12-05 DIAGNOSIS — R293 Abnormal posture: Secondary | ICD-10-CM | POA: Insufficient documentation

## 2016-12-05 DIAGNOSIS — M545 Low back pain: Secondary | ICD-10-CM | POA: Insufficient documentation

## 2016-12-05 NOTE — Telephone Encounter (Signed)
Message left to remind of missed appointment and to come next visit or call to cancel if cannot come.

## 2016-12-07 ENCOUNTER — Ambulatory Visit: Payer: Self-pay

## 2016-12-07 ENCOUNTER — Telehealth: Payer: Self-pay

## 2016-12-07 NOTE — Telephone Encounter (Signed)
Message left for missed appointment today and next appointment on the 12/12/16. Message was also that if appointment missed next week will cancel all appointments and she will need to call to request next appointment

## 2016-12-11 NOTE — Telephone Encounter (Signed)
Error

## 2016-12-12 ENCOUNTER — Ambulatory Visit: Payer: Self-pay

## 2016-12-12 DIAGNOSIS — M62830 Muscle spasm of back: Secondary | ICD-10-CM

## 2016-12-12 DIAGNOSIS — M4316 Spondylolisthesis, lumbar region: Secondary | ICD-10-CM

## 2016-12-12 DIAGNOSIS — M545 Low back pain: Secondary | ICD-10-CM

## 2016-12-12 DIAGNOSIS — G8929 Other chronic pain: Secondary | ICD-10-CM

## 2016-12-12 DIAGNOSIS — R293 Abnormal posture: Secondary | ICD-10-CM

## 2016-12-12 NOTE — Therapy (Signed)
[place], [place], [address] Phone: [phone]   Fax:  [phone]  Physical Therapy Treatment  Patient Details  Name: Wanda Crane MRN: [id number] Date of Birth: [birth year] Referring Provider: Domenica Fail ,PA  Encounter Date: 12/12/2016      PT End of Session - 12/12/16 1412    Visit Number 2   Number of Visits 12   Date for PT Re-Evaluation 01/05/17   Authorization Type CAFA   PT Start Time 0130   PT Stop Time 0215   PT Time Calculation (min) 45 min   Activity Tolerance Patient tolerated treatment well;No increased pain   Behavior During Therapy [facility] for tasks assessed/performed      History reviewed. No pertinent past medical history.  History reviewed. No pertinent surgical history.  There were no vitals filed for this visit.      Subjective Assessment - 12/12/16 1333    Subjective Heel lift uses sometimes and some times not due to comfort level.    Currently in Pain? Yes   Pain Score 7    Pain Location Back   Pain Orientation Left;Lower   Pain Descriptors / Indicators Burning;Shooting;Tightness   Pain Type Chronic pain   Pain Frequency Constant   Aggravating Factors  standing nad bending   Pain Relieving Factors lying and meds   Multiple Pain Sites No                         [facility] Adult PT Treatment/Exercise - 12/12/16 0001      Lumbar Exercises: Stretches   Single Knee to Chest Stretch 2 reps;30 seconds   Single Knee to Chest Stretch Limitations RT /LT   Lower Trunk Rotation 2 reps;30 seconds   Pelvic Tilt 10 seconds   Pelvic Tilt Limitations 10 reps   Piriformis Stretch 1 rep;30 seconds   Piriformis Stretch Limitations RT     Lumbar Exercises: Supine   Bent Knee Raise 15 reps   Bent Knee Raise Limitations RT and LT with cues for pelvic tilting     Modalities   Modalities Iontophoresis     Iontophoresis   Type of Iontophoresis Dexamethasone   Location  R SI area   Dose 1cc   Time 4-6 hurs at home     Manual Therapy   Manual Therapy Manual Traction;Passive ROM;Soft tissue mobilization   Soft tissue mobilization with ball to RT lower lumbar   Passive ROM SLR and kn/hip flexion and adduction with overpressure   Manual Traction To RT leg grade 4+/5 with oscillations x 40-50 reps x 8 sets                PT Education - 12/12/16 1334    Education provided Yes   Education Details attendance, HEP   Person(s) Educated Patient   Methods Explanation;Demonstration;Tactile cues;Verbal cues   Comprehension Verbalized understanding;Returned demonstration          PT Short Term Goals - 11/28/16 1635      PT SHORT TERM GOAL #1   Title She will be independent with inital HEP   Time 3   Period Weeks     PT SHORT TERM GOAL #2   Title She will report pain decreased 30% or more with walking and standing   Time 3   Period Weeks   Status New     PT SHORT TERM GOAL #3   Title She will be able to demo good  sitting posture and understanding of posterior pelvic tilt in standing  to decr lordosis   Time 3   Period Weeks   Status New           PT Long Term Goals - 11/28/16 1636      PT LONG TERM GOAL #1   Title She will be independent with all HEP issued   Time 6   Period Weeks   Status New     PT LONG TERM GOAL #2   Title She will be able to demo understanding of good posture and mechanics to decrese stress to lower back   Time 6   Period Weeks   Status New     PT LONG TERM GOAL #3   Title She will report pain decreased    to max 2-3 with sitting for 60 min   Time 6   Period Weeks   Status New     PT LONG TERM GOAL #4   Title She will report pain no > 2-3 with sitting for . 60 min   Time 6   Period Weeks   Status New     PT LONG TERM GOAL #5   Title She will be able to walk for shopping 60 min with pain 2-3/10 max   Time 6   Period Weeks   Status New               Plan - 12/12/16 1412    Clinical  Impression Statement She report no increased pain (7/10) but felt better post.  HEP for stretching and stability. no changes since eval   PT Treatment/Interventions Electrical Stimulation;Iontophoresis 4mg /ml Dexamethasone;Moist Heat;Passive range of motion;Patient/family education;Manual techniques;Therapeutic exercise;Dry needling   PT Next Visit Plan Core strength, modalities , manual   PT Home Exercise Plan Stretching Knee to chest /cross chest , lower trunk rotaiton , posterior pelvic tilt, bent leg raising   Consulted and Agree with Plan of Care Patient      Patient will benefit from skilled therapeutic intervention in order to improve the following deficits and impairments:  Decreased range of motion, Pain, Decreased activity tolerance, Postural dysfunction, Decreased strength, Increased muscle spasms  Visit Diagnosis: Spondylolisthesis of lumbar region  Chronic right-sided low back pain, with sciatica presence unspecified  Muscle spasm of back  Abnormal posture     Problem List Patient Active Problem List   Diagnosis Date Noted  . Spondylolisthesis, grade 1 11/27/2016    Darrel Hoover  PT 12/12/2016, 2:17 PM  [facility] [address] [place], [place], [address] Phone: [phone]   Fax:  [phone]  Name: Wanda Crane MRN: [id number] Date of Birth: 1990/09/25

## 2016-12-12 NOTE — Patient Instructions (Signed)
Issued from cabinet PPT , Knee to chest and across chest, lower trunk rotation , 2-3 reps 30 sec or more RT /LT  2x/day, and bent knee raises and PPT 1-2x/day 10 -20 reps hold 2-10 sec

## 2016-12-14 ENCOUNTER — Ambulatory Visit: Payer: Self-pay

## 2016-12-14 DIAGNOSIS — R293 Abnormal posture: Secondary | ICD-10-CM

## 2016-12-14 DIAGNOSIS — M545 Low back pain: Secondary | ICD-10-CM

## 2016-12-14 DIAGNOSIS — M62830 Muscle spasm of back: Secondary | ICD-10-CM

## 2016-12-14 DIAGNOSIS — M4316 Spondylolisthesis, lumbar region: Secondary | ICD-10-CM

## 2016-12-14 DIAGNOSIS — G8929 Other chronic pain: Secondary | ICD-10-CM

## 2016-12-14 NOTE — Therapy (Signed)
[place], [place], [address] Phone: [phone]   Fax:  [phone]  Physical Therapy Treatment  Patient Details  Name: Wanda Crane MRN: [id number] Date of Birth: 11-07-1990 Referring Provider: Domenica Fail ,PA  Encounter Date: 12/14/2016      PT End of Session - 12/14/16 0801    Visit Number 3   Number of Visits 12   Date for PT Re-Evaluation 01/05/17   Authorization Type CAFA   PT Start Time 0800   PT Stop Time 0845   PT Time Calculation (min) 45 min   Activity Tolerance Patient tolerated treatment well   Behavior During Therapy [facility] for tasks assessed/performed      No past medical history on file.  No past surgical history on file.  There were no vitals filed for this visit.      Subjective Assessment - 12/14/16 0803    Subjective Pad helped . Now 8/10 . May have slept wrong.   Took meds this AM waiting for them to kick in   Currently in Pain? Yes   Pain Score 8                          [facility] Adult PT Treatment/Exercise - 12/14/16 0001      Lumbar Exercises: Stretches   Single Knee to Chest Stretch 2 reps;30 seconds   Single Knee to Chest Stretch Limitations RT /LT cued to hold longer   Pelvic Tilt 10 seconds   Pelvic Tilt Limitations 15 reps   Piriformis Stretch 2 reps;30 seconds   Piriformis Stretch Limitations RT     Lumbar Exercises: Supine   Bent Knee Raise 10 reps   Bent Knee Raise Limitations RT and LT with cues for pelvic tilting to concentrate on keeping back flat   Bridge 10 reps   Bridge Limitations with PPT before lift cued to lift lower segmentally.   Other Supine Lumbar Exercises part sit up with legs on red ball x 15 arms parallel to tabel then LTA RT/LT x 15  with ball     Lumbar Exercises: Sidelying   Clam 15 reps   Clam Limitations RT and LT     Lumbar Exercises: Quadruped   Madcat/Old Horse 15 reps   Madcat/Old Horse Limitations cued to not  extend , followed by prayer stretch x 30 sec     Modalities   Modalities Ultrasound     Ultrasound   Ultrasound Location RT lower back /SI area   Ultrasound Parameters 100% 1 MHZ , 1.6 Wcm2   Ultrasound Goals Pain     Iontophoresis   Type of Iontophoresis Dexamethasone   Location R SI area   Dose 1cc   Time 4-6 hurs at home                  PT Short Term Goals - 12/14/16 0805      PT SHORT TERM GOAL #1   Title She will be independent with inital HEP   Status On-going     PT SHORT TERM GOAL #2   Title She will report pain decreased 30% or more with walking and standing   Status On-going     PT SHORT TERM GOAL #3   Title She will be able to demo good sitting posture and understanding of posterior pelvic tilt in standing  to decr lordosis   Status On-going  PT Long Term Goals - 12/14/16 0806      PT LONG TERM GOAL #1   Title She will be independent with all HEP issued   Status On-going     PT LONG TERM GOAL #2   Title She will be able to demo understanding of good posture and mechanics to decrease stress to lower back   Status On-going     PT LONG TERM GOAL #3   Title She will report pain decreased    to max 2-3 with sitting for 60 min   Status On-going     PT LONG TERM GOAL #4   Title She will report pain no > 2-3 with sitting for . 60 min   Status On-going     PT LONG TERM GOAL #5   Title She will be able to walk for shopping 60 min with pain 2-3/10 max   Status On-going               Plan - 12/14/16 0802    Clinical Impression Statement Flat affect same as last session No appearance of distress.  She reported back felt better post session .  She was able to do all exercises without expression of pain. continue with flexion based exercises. Add to HEP   PT Treatment/Interventions Electrical Stimulation;Iontophoresis 4mg /ml Dexamethasone;Moist Heat;Passive range of motion;Patient/family education;Manual techniques;Therapeutic  exercise;Dry needling   PT Next Visit Plan Core strength, modalities , manual, Add quadraped exer, shoulder bridge,  bent leg lifts, hip twist from pilates    PT Home Exercise Plan Stretching Knee to chest /cross chest , lower trunk rotaiton , posterior pelvic tilt, bent leg raising   Consulted and Agree with Plan of Care Patient      Patient will benefit from skilled therapeutic intervention in order to improve the following deficits and impairments:  Decreased range of motion, Pain, Decreased activity tolerance, Postural dysfunction, Decreased strength, Increased muscle spasms  Visit Diagnosis: Spondylolisthesis of lumbar region  Chronic right-sided low back pain, with sciatica presence unspecified  Muscle spasm of back  Abnormal posture     Problem List Patient Active Problem List   Diagnosis Date Noted  . Spondylolisthesis, grade 1 11/27/2016    Darrel Hoover  PT 12/14/2016, 8:38 AM  [facility] [address] [place], [place], [address] Phone: [phone]   Fax:  [phone]  Name: Wanda Crane MRN: [id number] Date of Birth: 08-18-1990

## 2016-12-19 ENCOUNTER — Ambulatory Visit: Payer: Self-pay | Admitting: Physical Therapy

## 2016-12-19 ENCOUNTER — Encounter: Payer: Self-pay | Admitting: Physical Therapy

## 2016-12-19 DIAGNOSIS — M4316 Spondylolisthesis, lumbar region: Secondary | ICD-10-CM

## 2016-12-19 DIAGNOSIS — M545 Low back pain: Secondary | ICD-10-CM

## 2016-12-19 DIAGNOSIS — R293 Abnormal posture: Secondary | ICD-10-CM

## 2016-12-19 DIAGNOSIS — G8929 Other chronic pain: Secondary | ICD-10-CM

## 2016-12-19 DIAGNOSIS — M62830 Muscle spasm of back: Secondary | ICD-10-CM

## 2016-12-19 NOTE — Patient Instructions (Signed)

## 2016-12-19 NOTE — Therapy (Signed)
[place], [place], [address] Phone: [phone]   Fax:  [phone]  Physical Therapy Treatment  Patient Details  Name: Wanda Crane MRN: [id number] Date of Birth: 09-28-1990 Referring Provider: Domenica Fail ,PA  Encounter Date: 12/19/2016      PT End of Session - 12/19/16 0946    Visit Number 4   Number of Visits 12   Date for PT Re-Evaluation 01/05/17   PT Start Time 0803   PT Stop Time 0848   PT Time Calculation (min) 45 min   Activity Tolerance Patient tolerated treatment well   Behavior During Therapy [facility] for tasks assessed/performed      History reviewed. No pertinent past medical history.  History reviewed. No pertinent surgical history.  There were no vitals filed for this visit.      Subjective Assessment - 12/19/16 0813    Subjective Exercises feel good.6/10.   Currently in Pain? Yes   Pain Score 6   up to 10/10   Pain Location Back   Pain Orientation Left;Lower   Pain Descriptors / Indicators Tightness;Shooting;Tingling;Burning   Pain Type Chronic pain   Pain Frequency Constant   Aggravating Factors  standing and bending,  sitting walking, drive   Pain Relieving Factors meds,  lying, biofreeze   Effect of Pain on Daily Activities not working,  not sleeping, ADL difficult,  driving difficult, avoids                         [facility] Adult PT Treatment/Exercise - 12/19/16 0001      Self-Care   Self-Care ADL's;Lifting;Other Self-Care Comments   ADL's demo light object lift,  ADL handout,  sleeping posture handout issued   Other Self-Care Comments  Vocational rehab what it is,  Job seeker's handbook     Lumbar Exercises: Stretches   Single Knee to Chest Stretch 2 reps;30 seconds   Pelvic Tilt 5 reps     Lumbar Exercises: Aerobic   Stationary Bike 5  minutes     Lumbar Exercises: Seated   LAQ on Ball Limitations sit fit 5 X march and reach 5 X  cues for  technique,  sitting practice practiced on sit fit prior to exercise     Lumbar Exercises: Supine   Bent Knee Raise 10 reps   Bent Knee Raise Limitations cues, monitored for technique, cues,  also march and arm reach 10 X     Lumbar Exercises: Quadruped   [place] --  1 rep to find comfort   Single Arm Raise 5 reps   Straight Leg Raise 5 reps   Opposite Arm/Leg Raise 5 reps   Opposite Arm/Leg Raise Limitations wbbles     Iontophoresis   Type of Iontophoresis Dexamethasone   Location R SI area   Dose 1cc   Time 4-6 hour patch  8 minutes                PT Education - 12/19/16 0840    Education provided Yes   Education Details Posture and body mechanics.  Vocational Rehab info handout.   Person(s) Educated Patient   Methods Explanation;Handout   Comprehension Verbalized understanding;Need further instruction          PT Short Term Goals - 12/19/16 0949      PT SHORT TERM GOAL #1   Title She will be independent with inital HEP   Baseline minor cues   Time 3  Period Weeks   Status On-going     PT SHORT TERM GOAL #2   Title She will report pain decreased 30% or more with walking and standing   Baseline not yet improved   Time 3   Period Weeks   Status On-going     PT SHORT TERM GOAL #3   Title She will be able to demo good sitting posture and understanding of posterior pelvic tilt in standing  to decr lordosis   Baseline working on   Time 3   Period Weeks   Status On-going           PT Long Term Goals - 12/14/16 1540      PT LONG TERM GOAL #1   Title She will be independent with all HEP issued   Status On-going     PT LONG TERM GOAL #2   Title She will be able to demo understanding of good posture and mechanics to decrease stress to lower back   Status On-going     PT LONG TERM GOAL #3   Title She will report pain decreased    to max 2-3 with sitting for 60 min   Status On-going     PT LONG TERM GOAL #4   Title She will report  pain no > 2-3 with sitting for . 60 min   Status On-going     PT LONG TERM GOAL #5   Title She will be able to walk for shopping 60 min with pain 2-3/10 max   Status On-going               Plan - 12/19/16 0947    Clinical Impression Statement Exercises help.  Pain continues at higher levels.  Beginning education on [employer],  Vocational Rehab, and Spondylolisthesis ed. No new goals med , but progress noted with Body mechanics goal.  Patient fatigues quickly with exercise.    PT Next Visit Plan Core strength, modalities , manual, Add quadraped exer, shoulder bridge,  bent leg lifts, hip twist from pilates Body mechanice education.  Consider hip hinge,     PT Home Exercise Plan Stretching Knee to chest /cross chest , lower trunk rotaiton , posterior pelvic tilt, bent leg raising   Consulted and Agree with Plan of Care Patient      Patient will benefit from skilled therapeutic intervention in order to improve the following deficits and impairments:  Decreased range of motion, Pain, Decreased activity tolerance, Postural dysfunction, Decreased strength, Increased muscle spasms  Visit Diagnosis: Spondylolisthesis of lumbar region  Chronic right-sided low back pain, with sciatica presence unspecified  Muscle spasm of back  Abnormal posture     Problem List Patient Active Problem List   Diagnosis Date Noted  . Spondylolisthesis, grade 1 11/27/2016    HARRIS,KAREN PTA 12/19/2016, 9:57 AM  [facility] [address] [place], [place], [address] Phone: [phone]   Fax:  [phone]  Name: Wanda Crane MRN: [id number] Date of Birth: [birth year]

## 2016-12-21 ENCOUNTER — Ambulatory Visit: Payer: Self-pay

## 2016-12-26 ENCOUNTER — Ambulatory Visit: Payer: Self-pay | Admitting: Physical Therapy

## 2016-12-26 DIAGNOSIS — G8929 Other chronic pain: Secondary | ICD-10-CM

## 2016-12-26 DIAGNOSIS — M545 Low back pain: Secondary | ICD-10-CM

## 2016-12-26 DIAGNOSIS — M62830 Muscle spasm of back: Secondary | ICD-10-CM

## 2016-12-26 DIAGNOSIS — R293 Abnormal posture: Secondary | ICD-10-CM

## 2016-12-26 DIAGNOSIS — M4316 Spondylolisthesis, lumbar region: Secondary | ICD-10-CM

## 2016-12-26 NOTE — Therapy (Signed)
[place], [place], [address] Phone: [phone]   Fax:  [phone]  Physical Therapy Treatment  Patient Details  Name: Wanda Crane MRN: [id number] Date of Birth: Dec 17, 1990 Referring Provider: Domenica Fail ,PA  Encounter Date: 12/26/2016      PT End of Session - 12/26/16 1053    Visit Number 5   Number of Visits 12   Date for PT Re-Evaluation 01/05/17   Authorization Type CAFA   PT Start Time 1015   PT Stop Time 1105   PT Time Calculation (min) 50 min   Activity Tolerance Patient tolerated treatment well   Behavior During Therapy [facility] for tasks assessed/performed      No past medical history on file.  No past surgical history on file.  There were no vitals filed for this visit.      Subjective Assessment - 12/26/16 1019    Subjective back is doing well today; rates pain 6/10.     Limitations Lifting;Standing   How long can you sit comfortably? 10-15 min (previously 25-30 min)   How long can you stand comfortably? 10-15 min (previously 25-30 min)   How long can you walk comfortably? "aching" pain after walking 300-400'   Patient Stated Goals She wants to get better.  To take some pain away.    Currently in Pain? Yes   Pain Score 6    Pain Location Back   Pain Orientation Left;Lower   Pain Descriptors / Indicators Tightness;Tingling;Burning   Pain Type Chronic pain   Pain Onset More than a month ago   Pain Frequency Constant   Aggravating Factors  standing and bending, sitting, walking, drive   Pain Relieving Factors gabapentin, and "a little bit of exercise"                         [facility] Adult PT Treatment/Exercise - 12/26/16 1022      Lumbar Exercises: Stretches   Single Knee to Chest Stretch 3 reps;30 seconds   Lower Trunk Rotation 3 reps;30 seconds   Lower Trunk Rotation Limitations bil   Piriformis Stretch 3 reps;30 seconds   Piriformis Stretch Limitations bil      Lumbar Exercises: Aerobic   Stationary Bike NuStep L5 x 6 min     Lumbar Exercises: Supine   Ab Set 15 reps  10 sec hold   Bent Knee Raise 10 reps   Bent Knee Raise Limitations cues for TA activation     Modalities   Modalities Moist Heat;Electrical Stimulation     Moist Heat Therapy   Number Minutes Moist Heat 15 Minutes   Moist Heat Location Lumbar Spine     Electrical Stimulation   Electrical Stimulation Location low back   Electrical Stimulation Action IFC   Electrical Stimulation Parameters to tolerance   Electrical Stimulation Goals Pain                  PT Short Term Goals - 12/19/16 0949      PT SHORT TERM GOAL #1   Title She will be independent with inital HEP   Baseline minor cues   Time 3   Period Weeks   Status On-going     PT SHORT TERM GOAL #2   Title She will report pain decreased 30% or more with walking and standing   Baseline not yet improved   Time 3   Period Weeks   Status On-going  PT SHORT TERM GOAL #3   Title She will be able to demo good sitting posture and understanding of posterior pelvic tilt in standing  to decr lordosis   Baseline working on   Time 3   Period Weeks   Status On-going           PT Long Term Goals - 12/14/16 4403      PT LONG TERM GOAL #1   Title She will be independent with all HEP issued   Status On-going     PT LONG TERM GOAL #2   Title She will be able to demo understanding of good posture and mechanics to decrease stress to lower back   Status On-going     PT LONG TERM GOAL #3   Title She will report pain decreased    to max 2-3 with sitting for 60 min   Status On-going     PT LONG TERM GOAL #4   Title She will report pain no > 2-3 with sitting for . 60 min   Status On-going     PT LONG TERM GOAL #5   Title She will be able to walk for shopping 60 min with pain 2-3/10 max   Status On-going               Plan - 12/26/16 1053    Clinical Impression Statement Session  focued on HEP review which pt needs min cues for proper technique.  Inititated estim today, and pt reports she has TENS unit at home.  Will continue to benefit from PT to improve pain and function.   PT Treatment/Interventions Electrical Stimulation;Iontophoresis 4mg /ml Dexamethasone;Moist Heat;Passive range of motion;Patient/family education;Manual techniques;Therapeutic exercise;Dry needling   PT Next Visit Plan Core strength, modalities , manual, Add quadraped exer, shoulder bridge,  bent leg lifts, hip twist from pilates Body mechanice education.  Consider hip hinge,     PT Home Exercise Plan Stretching Knee to chest /cross chest , lower trunk rotaiton , posterior pelvic tilt, bent leg raising   Consulted and Agree with Plan of Care Patient      Patient will benefit from skilled therapeutic intervention in order to improve the following deficits and impairments:  Decreased range of motion, Pain, Decreased activity tolerance, Postural dysfunction, Decreased strength, Increased muscle spasms  Visit Diagnosis: Spondylolisthesis of lumbar region  Chronic right-sided low back pain, with sciatica presence unspecified  Muscle spasm of back  Abnormal posture     Problem List Patient Active Problem List   Diagnosis Date Noted  . Spondylolisthesis, grade 1 11/27/2016      Laureen Abrahams, PT, DPT 12/26/16 10:55 AM    [facility] [address] [place], [place], [address] Phone: [phone]   Fax:  [phone]  Name: Wanda Crane MRN: [id number] Date of Birth: 02-03-1991

## 2016-12-27 ENCOUNTER — Encounter (INDEPENDENT_AMBULATORY_CARE_PROVIDER_SITE_OTHER): Payer: Self-pay | Admitting: Physician Assistant

## 2016-12-27 ENCOUNTER — Ambulatory Visit (INDEPENDENT_AMBULATORY_CARE_PROVIDER_SITE_OTHER): Payer: Self-pay | Admitting: Physician Assistant

## 2016-12-27 VITALS — BP 102/70 | HR 77 | Temp 98.1°F | Wt 212.6 lb

## 2016-12-27 DIAGNOSIS — M2170 Unequal limb length (acquired), unspecified site: Secondary | ICD-10-CM

## 2016-12-27 DIAGNOSIS — M4310 Spondylolisthesis, site unspecified: Secondary | ICD-10-CM

## 2016-12-27 MED ORDER — GABAPENTIN 300 MG PO CAPS: 900.0000 mg | ORAL_CAPSULE | Freq: Three times a day (TID) | ORAL | 3 refills | Status: DC

## 2016-12-27 MED ORDER — TRAMADOL HCL 50 MG PO TABS: 50.0000 mg | ORAL_TABLET | Freq: Three times a day (TID) | ORAL | 0 refills | Status: DC | PRN

## 2016-12-27 MED FILL — traMADol HCL 50 MG TABS: 50 | 10 days supply | Qty: 30 | Fill #0

## 2016-12-27 MED FILL — GABAPENTIN 300 MG CAPSULE: 300 | 30 days supply | Qty: 270 | Fill #0

## 2016-12-27 NOTE — Patient Instructions (Signed)
Spondylolisthesis Spondylolisthesis is when one of the bones in the spine (vertebrae) slips forward and out of place. This most commonly occurs in the lower back (lumbar spine), but it can happen anywhere along the spine. A vertebra may move out of place due to a previous back injury. In some cases, the injury may go unnoticed until the vertebra moves out of place later in life. Spondylolisthesis may also be caused by an injury or a condition that affects the bones. What are the causes? This condition may be caused by:  Injury (trauma). This is often a result of doing sports or physical activities that:  Put a lot of strain on the bones in the lower back.  Involve repetitive overstretching (hyperextension) of the spine.  A condition that affects the bones, such as osteoarthritis or cancer.  Changes in the spine that happen due to age-related wear and tear. What increases the risk? The following factors may make you more likely to develop this condition:  Participating in sports or activities that put a lot of strain on the lower back, including:  Gymnastics.  Figure skating.  Weight lifting.  Football.  Having a condition that affects the bones.  Being older than age 32. What are the signs or symptoms? Symptoms of this condition may include:  Mild to severe pain in the legs, lower back, or buttocks.  An abnormal way of walking (abnormal gait).  Poor posture.  Muscle stiffness, specifically in the hamstrings. The hamstrings are in the backs of the thighs.  Weakness, numbness, or a tingling sensation in the legs. Symptoms may get worse when standing, and they may temporarily get better when sitting down or bending forward. In some cases, there may be no symptoms of this condition. How is this diagnosed?   This condition may be diagnosed based on:  Your symptoms.  Your medical history.  A physical exam.  Your health care provider may push on certain areas to  determine the source of your pain.  You may be asked to bend forward, backward, and side to side so your health care provider can assess the severity of your pain and your range of motion.  Imaging tests, such as:  X-rays.  CT scan.  MRI. How is this treated? Treatment for this condition may include:  Resting. This may involve avoiding or modifying activities that put strain on your back until your symptoms improve.  Medicines to help relieve pain.  NSAIDs to help reduce swelling and discomfort.  Injections of medicine (cortisone) in your back. These injections can to help relieve pain and numbness.  A brace to stabilize and support your back.  Physical therapy. This may include working with an occupational therapist or physical therapist who can teach you how to reduce pressure on your back while you do everyday activities.  Surgery. This may be needed if:  Other treatment methods do not improve your condition.  Your symptoms do not go away after 3-6 months.  You are unable to walk or stand.  You have severe pain. Follow these instructions at home: If you have a brace:   Wear it as told by your health care provider. Remove it only as told by your health care provider.  Do not let your brace get wet if it is not waterproof.  Keep the brace clean. Driving   Do not drive or operate heavy machinery until you know how your pain medicine affects you.  Ask your health care provider when it is safe  to drive if you have a back brace. Activity   Rest and return to your normal activities as told by your health care provider. Ask your health care provider what activities are safe for you.  Avoid activities that take a lot of effort (are strenuous) for as long as told by your health care provider.  Do exercises as told by your health care provider. General instructions   Take over-the-counter and prescription medicines only as told by your health care provider.  If you  have questions or concerns about safety while taking pain medicine, talk with your health care provider.  Do not use any tobacco products, such as cigarettes, chewing tobacco, and e-cigarettes. Tobacco can delay bone healing. If you need help quitting, ask your health care provider.  Keep all follow-up visits as told by your health care provider. This is important. How is this prevented?  Warm up and stretch before being active.  Cool down and stretch after being active.  Give your body time to rest between periods of activity.  Make sure to use equipment that fits you.  Be safe and responsible while being active to avoid falls.  Do at least 150 minutes of moderate-intensity exercise each week, such as brisk walking or water aerobics.  Maintain physical fitness, including:  Strength.  Flexibility.  Cardiovascular fitness.  Endurance. Contact a health care provider if:  You have pain that gets worse or does not get better. Get help right away if:  You have severe back pain.  You develop weakness or numbness in your legs.  You are unable to stand or walk. This information is not intended to replace advice given to you by your health care provider. Make sure you discuss any questions you have with your health care provider. Document Released: 07/24/2005 Document Revised: 03/30/2016 Document Reviewed: 05/04/2015 Elsevier Interactive Patient Education  2017 [employer].

## 2016-12-27 NOTE — Progress Notes (Signed)
Subjective:  Patient ID: Wanda Crane, female    DOB: 1990-08-19  Age: 26 y.o. MRN: [id number]  CC: f/u back pain  HPI Wanda Crane is a 26 y.o. female with grade 1 spondylolisthesis of L5-S1 presents on f/u for back pain.  PT has been to 5 sessions of physical therapy. Last session tomorrow. Thinks perhaps she can extend the amount of sessions she can attend. Has thus far has "20% reduction" in pain with PT. Takes gabapentin and Tramadol PRN. Says gabapentin 600mg  TID with "20% reduction" in pain. Pain in back is worse with extension and some with flexion. She is hoping to get back to work soon. Does not endorse any other symptoms.     She adds that physical therapy found a leg length discrepancy. She was prescribed an insole orthotic but she says it is very uncomfortable and not used regularly.    Outpatient Medications Prior to Visit  Medication Sig Dispense Refill  . gabapentin (NEURONTIN) 600 MG tablet Take 1 tablet (600 mg total) by mouth as directed. Take one 600mg  in a.m., on 600 mg in afternoon, and two 600mg  qhs 120 tablet 1  . traMADol (ULTRAM) 50 MG tablet Take 1 tablet (50 mg total) by mouth every 8 (eight) hours as needed. 30 tablet 0   No facility-administered medications prior to visit.      ROS Review of Systems  Constitutional: Negative for chills, fever and malaise/fatigue.  Eyes: Negative for blurred vision.  Respiratory: Negative for shortness of breath.   Cardiovascular: Negative for chest pain and palpitations.  Gastrointestinal: Negative for abdominal pain and nausea.  Genitourinary: Negative for dysuria and hematuria.  Musculoskeletal: Positive for back pain. Negative for joint pain and myalgias.  Skin: Negative for rash.  Neurological: Negative for tingling and headaches.  Psychiatric/Behavioral: Negative for depression. The patient is not nervous/anxious.     Objective:  BP 102/70 (BP Location: Left Arm, Patient Position: Sitting, Cuff Size: Large)    Pulse 77   Temp 98.1 F (36.7 C) (Oral)   Wt 212 lb 9.6 oz (96.4 kg)   LMP 12/05/2016 (Exact Date)   SpO2 93%   BMI 37.66 kg/m   BP/Weight 12/27/2016 1/88/4166 0/01/3015  Systolic BP 010 932 98  Diastolic BP 70 78 67  Wt. (Lbs) 212.6 214.8 214.6  BMI 37.66 38.05 38.01      Physical Exam  Constitutional: She is oriented to person, place, and time.  Well developed, obese, NAD, reserved  HENT:  Head: Normocephalic and atraumatic.  Eyes: No scleral icterus.  Cardiovascular: Normal rate, regular rhythm and normal heart sounds.   Pulmonary/Chest: Effort normal and breath sounds normal.  Musculoskeletal: She exhibits no edema.  Right leg measured 35 inches from greater trochanter and left leg measured 34 1/4 from greater trochanter. Posture is abnormally erect when walking.  Neurological: She is alert and oriented to person, place, and time.  Skin: Skin is warm and dry. No rash noted. No erythema. No pallor.  Psychiatric: Her behavior is normal. Thought content normal.  Somewhat depressed mood  Vitals reviewed.    Assessment & Plan:   1. Spondylolisthesis, grade 1 - Increase gabapentin (NEURONTIN) 300 MG capsule; Take 3 capsules (900 mg total) by mouth 3 (three) times daily.  Dispense: 270 capsule; Refill: 3 - Refill traMADol (ULTRAM) 50 MG tablet; Take 1 tablet (50 mg total) by mouth every 8 (eight) hours as needed.  Dispense: 30 tablet; Refill: 0  2. Leg length discrepancy - AMB referral to  orthopedics   Meds ordered this encounter  Medications  . gabapentin (NEURONTIN) 300 MG capsule    Sig: Take 3 capsules (900 mg total) by mouth 3 (three) times daily.    Dispense:  270 capsule    Refill:  3    Order Specific Question:   Supervising Provider    Answer:   Tresa Garter [id number]  . traMADol (ULTRAM) 50 MG tablet    Sig: Take 1 tablet (50 mg total) by mouth every 8 (eight) hours as needed.    Dispense:  30 tablet    Refill:  0    Order Specific Question:    Supervising Provider    Answer:   Tresa Garter [id number]    Follow-up: Return in about 4 weeks (around 01/24/2017).   Clent Demark PA

## 2016-12-28 ENCOUNTER — Encounter: Payer: Self-pay | Admitting: Physical Therapy

## 2016-12-28 ENCOUNTER — Ambulatory Visit: Payer: Self-pay | Admitting: Physical Therapy

## 2016-12-28 DIAGNOSIS — M545 Low back pain: Secondary | ICD-10-CM

## 2016-12-28 DIAGNOSIS — G8929 Other chronic pain: Secondary | ICD-10-CM

## 2016-12-28 DIAGNOSIS — M62830 Muscle spasm of back: Secondary | ICD-10-CM

## 2016-12-28 DIAGNOSIS — R293 Abnormal posture: Secondary | ICD-10-CM

## 2016-12-28 DIAGNOSIS — M4316 Spondylolisthesis, lumbar region: Secondary | ICD-10-CM

## 2016-12-28 NOTE — Therapy (Addendum)
Wanda Crane, [place], [address] Phone: [phone]   Fax:  [phone]  Physical Therapy Treatment/Discharge  Patient Details  Name: Wanda Crane MRN: [id number] Date of Birth: [birth year] Referring Provider: Domenica Fail ,PA  Encounter Date: 12/28/2016      PT End of Session - 12/28/16 0934    Visit Number 5   Number of Visits 12   Date for PT Re-Evaluation 01/05/17   PT Start Time 0847   PT Stop Time 0945   PT Time Calculation (min) 58 min   Activity Tolerance Patient tolerated treatment well   Behavior During Therapy [facility] for tasks assessed/performed      History reviewed. No pertinent past medical history.  History reviewed. No pertinent surgical history.  There were no vitals filed for this visit.      Subjective Assessment - 12/28/16 0852    Subjective Tries to use good posture around the house.  9/10 .  it might hurt from laundry and vacume.  Standing limited to 15 minutes.   Currently in Pain? Yes   Pain Score 9    Pain Location Back   Pain Orientation Left;Lower   Pain Descriptors / Indicators Burning;Tightness;Tingling;Cramping   Pain Type Chronic pain   Pain Radiating Towards foot falls asleep sometimes   Aggravating Factors  sitting in passanger seat ,  vacume,  laundry   Pain Relieving Factors meds. not sure   Effect of Pain on Daily Activities not sleeping,  has someone lift heavy laundry.                          Wanda Crane Adult PT Treatment/Exercise - 12/28/16 0001      Self-Care   ADL's discussed techniques for laundry and vacume,  demo for patient,  other ADL's.     Lumbar Exercises: Stretches   Single Knee to Chest Stretch 3 reps;30 seconds   Single Knee to Chest Stretch Limitations slightly tighter left side   Lower Trunk Rotation 3 reps;30 seconds   Lower Trunk Rotation Limitations both   Pelvic Tilt 5 reps   Piriformis Stretch 1 rep;60 seconds   Piriformis Stretch Limitations on side     Lumbar Exercises: Supine   Bent Knee Raise 5 reps   Bent Knee Raise Limitations and another set with addition of arm reach alternating     Lumbar Exercises: Sidelying   Clam 10 reps  each   Other Sidelying Lumbar Exercises QL stretcl 1 rep 60 seconds with stretching hip     Modalities   Modalities Moist Heat;Electrical Stimulation     Moist Heat Therapy   Number Minutes Moist Heat 15 Minutes   Moist Heat Location Lumbar Spine     Electrical Stimulation   Electrical Stimulation Location low back   Electrical Stimulation Action IFC   Electrical Stimulation Parameters 6   Electrical Stimulation Goals Pain     Manual Therapy   Manual therapy comments 2 triggerpoint release left hip                PT Education - 12/28/16 0934    Education provided Yes   Education Details Posture body mechanics   Methods Explanation;Demonstration   Comprehension Verbalized understanding;Need further instruction          PT Short Term Goals - 12/28/16 1829      PT SHORT TERM GOAL #1   Title She will be independent with inital HEP  Baseline reminded to do some    Time 3   Period Weeks   Status On-going     PT SHORT TERM GOAL #2   Title She will report pain decreased 30% or more with walking and standing   Baseline pain flare today   Time 3   Period Weeks   Status On-going     PT SHORT TERM GOAL #3   Title She will be able to demo good sitting posture and understanding of posterior pelvic tilt in standing  to decr lordosis   Baseline working on   Time 3   Period Weeks   Status On-going           PT Long Term Goals - 12/14/16 1504      PT LONG TERM GOAL #1   Title She will be independent with all HEP issued   Status On-going     PT LONG TERM GOAL #2   Title She will be able to demo understanding of good posture and mechanics to decrease stress to lower back   Status On-going     PT LONG TERM GOAL #3   Title She  will report pain decreased    to max 2-3 with sitting for 60 min   Status On-going     PT LONG TERM GOAL #4   Title She will report pain no > 2-3 with sitting for . 60 min   Status On-going     PT LONG TERM GOAL #5   Title She will be able to walk for shopping 60 min with pain 2-3/10 max   Status On-going               Plan - 12/28/16 0935    Clinical Impression Statement Pain unchanged with session/ of stretches and exercises. .  Pain flare may be due to housework. She is trying to use correct posture but is not always aware. No nre goals met .  progress toward education goal.    PT Next Visit Plan Core strength, modalities , manual, Add quadraped exer, shoulder bridge,  bent leg lifts, hip twist from pilates Body mechanice education.  Consider hip hinge,   ADL ed to continue    PT Home Exercise Plan Stretching Knee to chest /cross chest , lower trunk rotaiton , posterior pelvic tilt, bent leg raising   Consulted and Agree with Plan of Care Patient      Patient will benefit from skilled therapeutic intervention in order to improve the following deficits and impairments:  Decreased range of motion, Pain, Decreased activity tolerance, Postural dysfunction, Decreased strength, Increased muscle spasms  Visit Diagnosis: Spondylolisthesis of lumbar region  Chronic right-sided low back pain, with sciatica presence unspecified  Abnormal posture  Muscle spasm of back     Problem List Patient Active Problem List   Diagnosis Date Noted  . Spondylolisthesis, grade 1 11/27/2016    Kord Monette PTA 12/28/2016, 9:40 AM  [facility] [address] [place], [place], [address] Phone: [phone]   Fax:  [phone]  Name: Wanda Crane MRN: [id number] Date of Birth: 1990/08/15  PHYSICAL THERAPY DISCHARGE SUMMARY  Visits from Start of Care: 5  Current functional level related to goals / functional outcomes: See above . She  no showed follow up appointment  6 /25/18   Remaining deficits: Unknown   Education / Equipment: HEP  Plan:  Patient goals were not met. Patient is being discharged due to not returning since the last visit.  ?????    Noralee Stain  PT  02/26/17  2:57 PM

## 2017-01-24 ENCOUNTER — Ambulatory Visit (HOSPITAL_COMMUNITY)
Admission: EM | Admit: 2017-01-24 | Discharge: 2017-01-24 | Disposition: A | Discharge status: Home / Self Care | Payer: Self-pay | Attending: Family Medicine | Admitting: Family Medicine

## 2017-01-24 ENCOUNTER — Other Ambulatory Visit (INDEPENDENT_AMBULATORY_CARE_PROVIDER_SITE_OTHER): Payer: Self-pay | Admitting: Physician Assistant

## 2017-01-24 ENCOUNTER — Ambulatory Visit (INDEPENDENT_AMBULATORY_CARE_PROVIDER_SITE_OTHER): Payer: Self-pay

## 2017-01-24 ENCOUNTER — Encounter (HOSPITAL_COMMUNITY): Payer: Self-pay | Admitting: Emergency Medicine

## 2017-01-24 DIAGNOSIS — S60221A Contusion of right hand, initial encounter: Secondary | ICD-10-CM

## 2017-01-24 DIAGNOSIS — M79641 Pain in right hand: Secondary | ICD-10-CM

## 2017-01-24 DIAGNOSIS — M4310 Spondylolisthesis, site unspecified: Secondary | ICD-10-CM

## 2017-01-24 MED ORDER — NAPROXEN 500 MG PO TABS: 500.0000 mg | ORAL_TABLET | Freq: Two times a day (BID) | ORAL | 0 refills | Status: DC

## 2017-01-24 MED ORDER — ACETAMINOPHEN 325 MG PO TABS
975.0000 mg | ORAL_TABLET | Freq: Once | ORAL | Status: AC
  Administered 2017-01-24: 975 mg via ORAL

## 2017-01-24 MED ORDER — ACETAMINOPHEN 325 MG PO TABS
ORAL_TABLET | ORAL | Status: AC
Start: 2017-01-24 — End: 2017-01-24
  Filled 2017-01-24: qty 3

## 2017-01-24 MED FILL — NAPROXEN 500 MG TABLET: 500 | 7 days supply | Qty: 14 | Fill #0

## 2017-01-24 NOTE — Telephone Encounter (Signed)
FWD to PCP. Tempestt S Roberts, CMA  

## 2017-01-24 NOTE — ED Triage Notes (Signed)
Pt slammed her hand in a door last night.  Today she has no ROM and she has some swelling, redness and bruising to her right 1st & 2nd knuckles.

## 2017-01-24 NOTE — ED Provider Notes (Signed)
CSN: [id number]     Arrival date & time 01/24/17  1223 History   None    Chief Complaint  Patient presents with  . Hand Injury   (Consider location/radiation/quality/duration/timing/severity/associated sxs/prior Treatment) Patient slammed her hand in door last night and she is having right hand pain.   The history is provided by the patient.  Hand Injury  Location:  Hand Hand location:  R hand Injury: yes   Time since incident:  1 day Pain details:    Quality:  Aching   Severity:  Moderate   Onset quality:  Sudden   Duration:  1 day   Timing:  Constant Handedness:  Right-handed Dislocation: no   Foreign body present:  Unable to specify Tetanus status:  Unknown Prior injury to area:  Yes Relieved by:  Nothing Worsened by:  Nothing   History reviewed. No pertinent past medical history. History reviewed. No pertinent surgical history. Family History  Problem Relation Age of Onset  . Asthma Mother   . COPD Mother   . Diabetes Maternal Grandmother   . Heart disease Maternal Grandmother   . Stroke Maternal Grandmother   . Diabetes Maternal Grandfather   . Heart disease Maternal Grandfather   . Stroke Maternal Grandfather    Social History  Substance Use Topics  . Smoking status: Never Smoker  . Smokeless tobacco: Never Used  . Alcohol use No   OB History    No data available     Review of Systems  Constitutional: Negative.   HENT: Negative.   Eyes: Negative.   Respiratory: Negative.   Cardiovascular: Negative.   Endocrine: Negative.   Genitourinary: Negative.   Musculoskeletal: Positive for arthralgias.  Allergic/Immunologic: Negative.   Neurological: Negative.   Hematological: Negative.   Psychiatric/Behavioral: Negative.     Allergies  Patient has no known allergies.  Home Medications   Prior to Admission medications   Medication Sig Start Date End Date Taking? Authorizing Provider  gabapentin (NEURONTIN) 300 MG capsule Take 3 capsules (900 mg  total) by mouth 3 (three) times daily. 12/27/16 01/26/17 Yes Clent Demark, PA-C  traMADol (ULTRAM) 50 MG tablet Take 1 tablet (50 mg total) by mouth every 8 (eight) hours as needed. 12/27/16  Yes Clent Demark, PA-C  naproxen (NAPROSYN) 500 MG tablet Take 1 tablet (500 mg total) by mouth 2 (two) times daily with a meal. 01/24/17   Oxford, Orson Ape, FNP   Meds Ordered and Administered this Visit   Medications  acetaminophen (TYLENOL) tablet 975 mg (975 mg Oral Given 01/24/17 1328)    BP 114/79 (BP Location: Left Wrist)   Pulse 70   Temp 98.7 F (37.1 C) (Oral)   LMP 01/17/2017 (Approximate)   SpO2 97%  No data found.   Physical Exam  Constitutional: She appears well-developed and well-nourished.  HENT:  Head: Normocephalic and atraumatic.  Eyes: Conjunctivae and EOM are normal. Pupils are equal, round, and reactive to light.  Neck: Normal range of motion. Neck supple.  Cardiovascular: Normal rate, regular rhythm and normal heart sounds.   Pulmonary/Chest: Effort normal.  Musculoskeletal: She exhibits edema and tenderness.  Right 4th and 5th knuckles with bruising and tenderness.  Nursing note and vitals reviewed.   Urgent Care Course     Procedures (including critical care time)  Labs Review Labs Reviewed - No data to display  Imaging Review Dg Hand Complete Right  Result Date: 01/24/2017 CLINICAL DATA:  Right hand injury last night. Pain along the ulnar  side of hand and fifth metacarpal as well as little finger. EXAM: RIGHT HAND - COMPLETE 3+ VIEW COMPARISON:  None. FINDINGS: There is no evidence of fracture or dislocation. There is no evidence of arthropathy or other focal bone abnormality. Soft tissues are unremarkable. IMPRESSION: Negative. Electronically Signed   By: Rolm Baptise M.D.   On: 01/24/2017 13:15     Visual Acuity Review  Right Eye Distance:   Left Eye Distance:   Bilateral Distance:    Right Eye Near:   Left Eye Near:    Bilateral Near:          MDM   1. Right hand pain   2. Contusion of right hand, initial encounter    ICE  Sling right arm Naprosyn 500mg  one po bid x 10 days   Tylenol 975mg  now      Lysbeth Penner, FNP 01/24/17 1515

## 2017-01-25 ENCOUNTER — Emergency Department (HOSPITAL_COMMUNITY)
Admission: EM | Admit: 2017-01-25 | Discharge: 2017-01-26 | Disposition: A | Discharge status: Home / Self Care | Payer: Self-pay | Attending: Emergency Medicine | Admitting: Emergency Medicine

## 2017-01-25 ENCOUNTER — Encounter (HOSPITAL_COMMUNITY): Payer: Self-pay

## 2017-01-25 ENCOUNTER — Other Ambulatory Visit (INDEPENDENT_AMBULATORY_CARE_PROVIDER_SITE_OTHER): Payer: Self-pay | Admitting: Physician Assistant

## 2017-01-25 DIAGNOSIS — Z79899 Other long term (current) drug therapy: Secondary | ICD-10-CM | POA: Insufficient documentation

## 2017-01-25 DIAGNOSIS — Y939 Activity, unspecified: Secondary | ICD-10-CM | POA: Insufficient documentation

## 2017-01-25 DIAGNOSIS — W231XXA Caught, crushed, jammed, or pinched between stationary objects, initial encounter: Secondary | ICD-10-CM | POA: Insufficient documentation

## 2017-01-25 DIAGNOSIS — M4310 Spondylolisthesis, site unspecified: Secondary | ICD-10-CM

## 2017-01-25 DIAGNOSIS — Y999 Unspecified external cause status: Secondary | ICD-10-CM | POA: Insufficient documentation

## 2017-01-25 DIAGNOSIS — S60221A Contusion of right hand, initial encounter: Secondary | ICD-10-CM | POA: Insufficient documentation

## 2017-01-25 DIAGNOSIS — Y929 Unspecified place or not applicable: Secondary | ICD-10-CM | POA: Insufficient documentation

## 2017-01-25 MED ORDER — TRAMADOL HCL 50 MG PO TABS: 50.0000 mg | ORAL_TABLET | Freq: Four times a day (QID) | ORAL | 0 refills | Status: DC | PRN

## 2017-01-25 NOTE — ED Triage Notes (Signed)
Pt reports she slammed her right hand in a door two days ago. She states she went to urgent care yesterday and had an xray and was told there were no broken bones and it was just a "contusion" but patient states she thinks "its more than that."

## 2017-01-25 NOTE — Discharge Instructions (Signed)
Take the Naprosyn in addition to the medication we give you. Follow up with your health care provider. Return here as needed.

## 2017-01-25 NOTE — ED Provider Notes (Signed)
[facility] DEPT Provider Note   CSN: [id number] Arrival date & time: 01/25/17  2135  By signing my name below, I, Wanda Crane, attest that this documentation has been prepared under the direction and in the presence of [place]. Janit Bern, NP. Electronically Signed: Dora Crane, Scribe. 01/25/2017. 11:52 PM.  History   Chief Complaint Chief Complaint  Patient presents with  . Hand Injury   The history is provided by the patient. No language interpreter was used.  Hand Injury   The incident occurred 2 days ago. The incident occurred at home. The injury mechanism was compression. The pain is present in the right hand. The pain is at a severity of 10/10. The pain is severe. The pain has been constant since the incident. Pertinent negatives include no fever and no malaise/fatigue. She reports no foreign bodies present. The symptoms are aggravated by movement, palpation and use. She has tried NSAIDs and ice for the symptoms. The treatment provided no relief.    HPI Comments: Wanda Crane is a 26 y.o. female who presents to the Emergency Department for evaluation of a right hand injury sustained two days ago. Patient accidentally slammed the door to her home on her right hand. Patient reports constant, 10/10 pain to her right hand since the injury occurred and a gradual onset of swelling. She was evaluated at Urgent Care yesterday and had a right hand x-ray which was negative. She was discharged with naproxen which she has taken without improvement of her pain. Patient has also been applying ice to her right hand without relief. She denies numbness/tingling, open wounds, or any other associated symptoms.  History reviewed. No pertinent past medical history.  Patient Active Problem List   Diagnosis Date Noted  . Spondylolisthesis, grade 1 11/27/2016    History reviewed. No pertinent surgical history.  OB History    No data available       Home Medications    Prior to Admission  medications   Medication Sig Start Date End Date Taking? Authorizing Provider  gabapentin (NEURONTIN) 300 MG capsule Take 3 capsules (900 mg total) by mouth 3 (three) times daily. 12/27/16 01/26/17  Clent Demark, PA-C  naproxen (NAPROSYN) 500 MG tablet Take 1 tablet (500 mg total) by mouth 2 (two) times daily with a meal. 01/24/17   Oxford, Orson Ape, FNP  traMADol (ULTRAM) 50 MG tablet Take 1 tablet (50 mg total) by mouth every 6 (six) hours as needed. 01/25/17   Ashley Murrain, NP    Family History Family History  Problem Relation Age of Onset  . Asthma Mother   . COPD Mother   . Diabetes Maternal Grandmother   . Heart disease Maternal Grandmother   . Stroke Maternal Grandmother   . Diabetes Maternal Grandfather   . Heart disease Maternal Grandfather   . Stroke Maternal Grandfather     Social History Social History  Substance Use Topics  . Smoking status: Never Smoker  . Smokeless tobacco: Never Used  . Alcohol use No     Allergies   Patient has no known allergies.   Review of Systems Review of Systems  Constitutional: Negative for fever and malaise/fatigue.  Musculoskeletal: Positive for arthralgias and joint swelling.  Skin: Negative for wound.  Neurological: Negative for numbness.  All other systems reviewed and are negative.  Physical Exam Updated Vital Signs BP 128/80 (BP Location: Left Arm)   Pulse 84   Temp 99.3 F (37.4 C) (Oral)   Resp 16  LMP 01/17/2017 (Approximate)   SpO2 98%   Physical Exam  Constitutional: She is oriented to person, place, and time. She appears well-developed and well-nourished. No distress.  HENT:  Head: Normocephalic and atraumatic.  Eyes: Conjunctivae and EOM are normal.  Neck: Neck supple.  Cardiovascular: Normal rate.   Pulses:      Radial pulses are 2+ on the right side.  Pulmonary/Chest: Effort normal. No respiratory distress.  Musculoskeletal: She exhibits tenderness.       Right hand: She exhibits tenderness and  swelling. She exhibits normal range of motion, normal capillary refill, no deformity and no laceration. Normal sensation noted. Normal strength noted. She exhibits no thumb/finger opposition.  Tenderness with ROM of the ring finger and small finger on the right hand. Swelling and ecchymosis noted at the base of the ring and little fingers on the right. Adequate circulation. Negative finger-to-thumb opposition.  Neurological: She is alert and oriented to person, place, and time.  Skin: Skin is warm and dry.  Psychiatric: She has a normal mood and affect. Her behavior is normal.  Nursing note and vitals reviewed.  ED Treatments / Results  Labs (all labs ordered are listed, but only abnormal results are displayed) Labs Reviewed - No data to display  EKG  EKG Interpretation None       Radiology Dg Hand Complete Right  Result Date: 01/24/2017 CLINICAL DATA:  Right hand injury last night. Pain along the ulnar side of hand and fifth metacarpal as well as little finger. EXAM: RIGHT HAND - COMPLETE 3+ VIEW COMPARISON:  None. FINDINGS: There is no evidence of fracture or dislocation. There is no evidence of arthropathy or other focal bone abnormality. Soft tissues are unremarkable. IMPRESSION: Negative. Electronically Signed   By: Rolm Baptise M.D.   On: 01/24/2017 13:15    Procedures Procedures (including critical care time)  DIAGNOSTIC STUDIES: Oxygen Saturation is 97% on RA, normal by my interpretation.    COORDINATION OF CARE: 11:51 PM Discussed treatment plan with pt at bedside and pt agreed to plan.  Medications Ordered in ED Medications - No data to display   Initial Impression / Assessment and Plan / ED Course  I have reviewed the triage vital signs and the nursing notes. Patient X-Ray from Urgent Care negative for obvious fracture or dislocation.  Pt advised to follow up with her PCP. Patient given splint while in ED, conservative therapy recommended and discussed. Patient will  be discharged home & is agreeable with above plan. Returns precautions discussed. Pt appears safe for discharge. Patient remains neurovascularly intact.    Final Clinical Impressions(s) / ED Diagnoses   Final diagnoses:  Contusion of right hand, initial encounter    New Prescriptions Discharge Medication List as of 01/25/2017 11:58 PM    I personally performed the services described in this documentation, which was scribed in my presence. The recorded information has been reviewed and is accurate.    Debroah Baller [place], [place] 01/26/17 0488    Jola Schmidt, MD 01/29/17 [phone]

## 2017-01-26 MED FILL — traMADol HCL 50 MG TABS: 50 | 2 days supply | Qty: 10 | Fill #0

## 2017-01-26 MED FILL — GABAPENTIN 600 MG TABLET: 600 | 30 days supply | Qty: 120 | Fill #0

## 2017-01-26 NOTE — Progress Notes (Signed)
Orthopedic Tech Progress Note Patient Details:  Wanda Crane 02-Jun-1991 [id number]  Ortho Devices Type of Ortho Device: Arm sling, Ulna gutter splint Ortho Device/Splint Location: rue Ortho Device/Splint Interventions: Ordered, Application, Adjustment   Karolee Stamps 01/26/2017, 12:36 AM

## 2017-01-26 NOTE — ED Notes (Signed)
Pt stable, understands discharge instructions, and reasons for return.   

## 2017-01-29 ENCOUNTER — Ambulatory Visit: Payer: Self-pay | Attending: Physical Therapy | Admitting: Physical Therapy

## 2017-01-29 NOTE — Telephone Encounter (Signed)
FWD to PCP. Wanda Crane S Maryon Kemnitz, CMA  

## 2017-02-22 ENCOUNTER — Encounter (INDEPENDENT_AMBULATORY_CARE_PROVIDER_SITE_OTHER): Payer: Self-pay | Admitting: Physician Assistant

## 2017-02-22 ENCOUNTER — Ambulatory Visit (INDEPENDENT_AMBULATORY_CARE_PROVIDER_SITE_OTHER): Payer: Self-pay | Admitting: Physician Assistant

## 2017-02-22 DIAGNOSIS — M4310 Spondylolisthesis, site unspecified: Secondary | ICD-10-CM

## 2017-02-22 MED ORDER — TRAMADOL HCL 50 MG PO TABS: 50.0000 mg | ORAL_TABLET | Freq: Two times a day (BID) | ORAL | 0 refills | Status: DC

## 2017-02-22 MED ORDER — GABAPENTIN 300 MG PO CAPS: 900.0000 mg | ORAL_CAPSULE | Freq: Three times a day (TID) | ORAL | 3 refills | Status: DC

## 2017-02-22 MED ORDER — NAPROXEN 500 MG PO TABS: 500.0000 mg | ORAL_TABLET | Freq: Two times a day (BID) | ORAL | 0 refills | Status: DC

## 2017-02-22 MED FILL — traMADol HCL 50 MG TABS: 50 | 30 days supply | Qty: 60 | Fill #0

## 2017-02-22 MED FILL — NAPROXEN 500 MG TABLET: 500 | 30 days supply | Qty: 60 | Fill #0

## 2017-02-22 MED FILL — GABAPENTIN 300 MG CAPSULE: 300 | 30 days supply | Qty: 270 | Fill #0

## 2017-02-22 NOTE — Patient Instructions (Signed)
Spondylolisthesis Spondylolisthesis is when one of the bones in the spine (vertebrae) slips forward and out of place. This most commonly occurs in the lower back (lumbar spine), but it can happen anywhere along the spine. A vertebra may move out of place due to a previous back injury. In some cases, the injury may go unnoticed until the vertebra moves out of place later in life. Spondylolisthesis may also be caused by an injury or a condition that affects the bones. What are the causes? This condition may be caused by:  Injury (trauma). This is often a result of doing sports or physical activities that: ? Put a lot of strain on the bones in the lower back. ? Involve repetitive overstretching (hyperextension) of the spine.  A condition that affects the bones, such as osteoarthritis or cancer.  Changes in the spine that happen due to age-related wear and tear.  What increases the risk? The following factors may make you more likely to develop this condition:  Participating in sports or activities that put a lot of strain on the lower back, including: ? Gymnastics. ? Figure skating. ? Weight lifting. ? Football.  Having a condition that affects the bones.  Being older than age 50.  What are the signs or symptoms? Symptoms of this condition may include:  Mild to severe pain in the legs, lower back, or buttocks.  An abnormal way of walking (abnormal gait).  Poor posture.  Muscle stiffness, specifically in the hamstrings. The hamstrings are in the backs of the thighs.  Weakness, numbness, or a tingling sensation in the legs.  Symptoms may get worse when standing, and they may temporarily get better when sitting down or bending forward. In some cases, there may be no symptoms of this condition. How is this diagnosed?  This condition may be diagnosed based on:  Your symptoms.  Your medical history.  A physical exam. ? Your health care provider may push on certain areas to  determine the source of your pain. ? You may be asked to bend forward, backward, and side to side so your health care provider can assess the severity of your pain and your range of motion.  Imaging tests, such as: ? X-rays. ? CT scan. ? MRI.  How is this treated? Treatment for this condition may include:  Resting. This may involve avoiding or modifying activities that put strain on your back until your symptoms improve.  Medicines to help relieve pain.  NSAIDs to help reduce swelling and discomfort.  Injections of medicine (cortisone) in your back. These injections can to help relieve pain and numbness.  A brace to stabilize and support your back.  Physical therapy. This may include working with an occupational therapist or physical therapist who can teach you how to reduce pressure on your back while you do everyday activities.  Surgery. This may be needed if: ? Other treatment methods do not improve your condition. ? Your symptoms do not go away after 3-6 months. ? You are unable to walk or stand. ? You have severe pain.  Follow these instructions at home: If you have a brace:  Wear it as told by your health care provider. Remove it only as told by your health care provider.  Do not let your brace get wet if it is not waterproof.  Keep the brace clean. Driving  Do not drive or operate heavy machinery until you know how your pain medicine affects you.  Ask your health care provider when it   is safe to drive if you have a back brace. Activity  Rest and return to your normal activities as told by your health care provider. Ask your health care provider what activities are safe for you.  Avoid activities that take a lot of effort (are strenuous) for as long as told by your health care provider.  Do exercises as told by your health care provider. General instructions  Take over-the-counter and prescription medicines only as told by your health care provider.  If you  have questions or concerns about safety while taking pain medicine, talk with your health care provider.  Do not use any tobacco products, such as cigarettes, chewing tobacco, and e-cigarettes. Tobacco can delay bone healing. If you need help quitting, ask your health care provider.  Keep all follow-up visits as told by your health care provider. This is important. How is this prevented?  Warm up and stretch before being active.  Cool down and stretch after being active.  Give your body time to rest between periods of activity.  Make sure to use equipment that fits you.  Be safe and responsible while being active to avoid falls.  Do at least 150 minutes of moderate-intensity exercise each week, such as brisk walking or water aerobics.  Maintain physical fitness, including: ? Strength. ? Flexibility. ? Cardiovascular fitness. ? Endurance. Contact a health care provider if:  You have pain that gets worse or does not get better. Get help right away if:  You have severe back pain.  You develop weakness or numbness in your legs.  You are unable to stand or walk. This information is not intended to replace advice given to you by your health care provider. Make sure you discuss any questions you have with your health care provider. Document Released: 07/24/2005 Document Revised: 03/30/2016 Document Reviewed: 05/04/2015 Elsevier Interactive Patient Education  2018 [employer]  

## 2017-02-22 NOTE — Progress Notes (Signed)
Subjective:  Patient ID: Wanda Crane, female    DOB: Apr 08, 1991  Age: 26 y.o. MRN: [id number]  CC: f/u back pain  HPI  Wanda Crane is a 26 y.o. female with grade 1 spondylolisthesis of L5-S1 presents on f/u for back pain. Had six sessions in total of physical therapy. Could not get more sessions approved. Feels that she has stayed at approximately 20% reduction in pain overall. Increase in Gabapentin did not further reduce her pain. 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 Takes breaks to help relieve pain. Pain worse with extension and flexion. Less pain with lateral flexion and rotation. Denies paralysis, LE weakness, saddle paresthesia, urinary dysfunction, or fecal incontinence. No other symptoms or complaints.    Outpatient Medications Prior to Visit  Medication Sig Dispense Refill  . naproxen (NAPROSYN) 500 MG tablet Take 1 tablet (500 mg total) by mouth 2 (two) times daily with a meal. 14 tablet 0  . traMADol (ULTRAM) 50 MG tablet Take 1 tablet (50 mg total) by mouth every 6 (six) hours as needed. 10 tablet 0  . gabapentin (NEURONTIN) 300 MG capsule Take 3 capsules (900 mg total) by mouth 3 (three) times daily. 270 capsule 3   No facility-administered medications prior to visit.      ROS Review of Systems  Constitutional: Negative for chills, fever and malaise/fatigue.  Eyes: Negative for blurred vision.  Respiratory: Negative for shortness of breath.   Cardiovascular: Negative for chest pain and palpitations.  Gastrointestinal: Negative for abdominal pain and nausea.  Genitourinary: Negative for dysuria and hematuria.  Musculoskeletal: Positive for back pain. Negative for joint pain and myalgias.  Skin: Negative for rash.  Neurological: Negative for tingling and headaches.  Psychiatric/Behavioral: Negative for depression. The patient is not nervous/anxious.     Objective:  BP 114/81 (BP Location: Left Arm,  Patient Position: Sitting, Cuff Size: Large)   Pulse 78   Temp 98.2 F (36.8 C) (Oral)   Wt 213 lb 6.4 oz (96.8 kg)   LMP 02/04/2017 (Exact Date)   SpO2 94%   BMI 37.80 kg/m   BP/Weight 02/22/2017 01/26/2017 2/97/9892  Systolic BP 119 417 408  Diastolic BP 81 80 79  Wt. (Lbs) 213.4 - -  BMI 37.8 - -      Physical Exam  Constitutional: She is oriented to person, place, and time.  Well developed, obese, NAD, polite  HENT:  Head: Normocephalic and atraumatic.  Eyes: No scleral icterus.  Cardiovascular: Normal rate, regular rhythm and normal heart sounds.   Pulmonary/Chest: Effort normal and breath sounds normal.  Musculoskeletal: She exhibits no edema.  Back flexion approximately 35 degrees, extension approximately 5 degrees. Lateral flexion bilaterally approximately 10 degrees.  Neurological: She is alert and oriented to person, place, and time. No cranial nerve deficit. Coordination normal.  Skin: Skin is warm and dry. No rash noted. No erythema. No pallor.  Psychiatric: She has a normal mood and affect. Her behavior is normal. Thought content normal.  Vitals reviewed.    Assessment & Plan:   1. Spondylolisthesis, grade 1 - Ambulatory referral to Pain Clinic - Ambulatory referral to Psychiatry - Refill traMADol (ULTRAM) 50 MG tablet; Take 1 tablet (50 mg total) by mouth every 12 (twelve) hours.  Dispense: 60 tablet; Refill: 0 - Refill gabapentin (NEURONTIN) 300 MG capsule; Take 3 capsules (900 mg total) by mouth 3 (three) times daily.  Dispense: 270 capsule; Refill: 3 - Refill naproxen (NAPROSYN) 500  MG tablet; Take 1 tablet (500 mg total) by mouth 2 (two) times daily with a meal.  Dispense: 60 tablet; Refill: 0   Meds ordered this encounter  Medications  . traMADol (ULTRAM) 50 MG tablet    Sig: Take 1 tablet (50 mg total) by mouth every 12 (twelve) hours.    Dispense:  60 tablet    Refill:  0    Order Specific Question:   Supervising Provider    Answer:   Tresa Garter [id number]  . gabapentin (NEURONTIN) 300 MG capsule    Sig: Take 3 capsules (900 mg total) by mouth 3 (three) times daily.    Dispense:  270 capsule    Refill:  3    Order Specific Question:   Supervising Provider    Answer:   Tresa Garter [id number]  . naproxen (NAPROSYN) 500 MG tablet    Sig: Take 1 tablet (500 mg total) by mouth 2 (two) times daily with a meal.    Dispense:  60 tablet    Refill:  0    Order Specific Question:   Supervising Provider    Answer:   Tresa Garter [id number]    Follow-up: Return if symptoms worsen or fail to improve.   Clent Demark PA

## 2017-02-23 ENCOUNTER — Other Ambulatory Visit (INDEPENDENT_AMBULATORY_CARE_PROVIDER_SITE_OTHER): Payer: Self-pay | Admitting: Physician Assistant

## 2017-02-23 DIAGNOSIS — M4310 Spondylolisthesis, site unspecified: Secondary | ICD-10-CM

## 2017-02-23 MED FILL — GABAPENTIN 600 MG TABLET: 600 | 30 days supply | Qty: 120 | Fill #1

## 2017-02-23 NOTE — Telephone Encounter (Signed)
FWD to PCP. Tempestt S Roberts, CMA  

## 2017-02-27 ENCOUNTER — Encounter (HOSPITAL_COMMUNITY): Payer: Self-pay | Admitting: Family Medicine

## 2017-02-27 ENCOUNTER — Ambulatory Visit (HOSPITAL_COMMUNITY)
Admission: EM | Admit: 2017-02-27 | Discharge: 2017-02-27 | Disposition: A | Discharge status: Home / Self Care | Payer: Self-pay | Attending: Family Medicine | Admitting: Family Medicine

## 2017-02-27 DIAGNOSIS — K130 Diseases of lips: Secondary | ICD-10-CM

## 2017-02-27 MED ORDER — AMOXICILLIN-POT CLAVULANATE 875-125 MG PO TABS: 1.0000 | ORAL_TABLET | Freq: Two times a day (BID) | ORAL | 0 refills | Status: DC

## 2017-02-27 NOTE — ED Provider Notes (Signed)
CSN: [id number]     Arrival date & time 02/27/17  1932 History   None    Chief Complaint  Patient presents with  . Oral Swelling   (Consider location/radiation/quality/duration/timing/severity/associated sxs/prior Treatment) Patient fell striking upper lip on table and she has injured the mucosa of her mouth.  She has swelling of the upper lip.   The history is provided by the patient.  Mouth Injury  This is a new problem. The problem occurs constantly. The problem has not changed since onset.Nothing aggravates the symptoms. Nothing relieves the symptoms.    History reviewed. No pertinent past medical history. History reviewed. No pertinent surgical history. Family History  Problem Relation Age of Onset  . Asthma Mother   . COPD Mother   . Diabetes Maternal Grandmother   . Heart disease Maternal Grandmother   . Stroke Maternal Grandmother   . Diabetes Maternal Grandfather   . Heart disease Maternal Grandfather   . Stroke Maternal Grandfather    Social History  Substance Use Topics  . Smoking status: Never Smoker  . Smokeless tobacco: Never Used  . Alcohol use No   OB History    No data available     Review of Systems  Constitutional: Negative.   HENT: Positive for mouth sores.   Eyes: Negative.   Respiratory: Negative.   Cardiovascular: Negative.   Gastrointestinal: Negative.   Endocrine: Negative.   Genitourinary: Negative.   Musculoskeletal: Negative.   Allergic/Immunologic: Negative.   Neurological: Negative.   Hematological: Negative.  Bruises/bleeds easily:    Psychiatric/Behavioral: Negative.     Allergies  Patient has no known allergies.  Home Medications   Prior to Admission medications   Medication Sig Start Date End Date Taking? Authorizing Provider  amoxicillin-clavulanate (AUGMENTIN) 875-125 MG tablet Take 1 tablet by mouth 2 (two) times daily. 02/27/17   Lysbeth Penner, FNP  gabapentin (NEURONTIN) 300 MG capsule Take 3 capsules (900 mg  total) by mouth 3 (three) times daily. 02/22/17 03/24/17  Clent Demark, PA-C  naproxen (NAPROSYN) 500 MG tablet Take 1 tablet (500 mg total) by mouth 2 (two) times daily with a meal. 02/22/17   Clent Demark, PA-C  traMADol (ULTRAM) 50 MG tablet Take 1 tablet (50 mg total) by mouth every 12 (twelve) hours. 02/22/17   Clent Demark, PA-C   Meds Ordered and Administered this Visit  Medications - No data to display  BP 116/78   Pulse 89   Temp 98.5 F (36.9 C)   Resp 18   LMP 02/04/2017 (Exact Date)   SpO2 99%  No data found.   Physical Exam  Constitutional: She appears well-developed and well-nourished.  HENT:  Head: Normocephalic and atraumatic.  Left inner lip with abrasion and swelling and upper lip with tenderness and swelling  Eyes: Pupils are equal, round, and reactive to light. Conjunctivae and EOM are normal.  Cardiovascular: Normal rate, regular rhythm and normal heart sounds.   Pulmonary/Chest: Effort normal and breath sounds normal.  Nursing note and vitals reviewed.   Urgent Care Course     Procedures (including critical care time)  Labs Review Labs Reviewed - No data to display  Imaging Review No results found.   Visual Acuity Review  Right Eye Distance:   Left Eye Distance:   Bilateral Distance:    Right Eye Near:   Left Eye Near:    Bilateral Near:         MDM   1. Cellulitis of lip  Augmentin 875mg  one po bid x 10 days #20 Apply ice  [address]      Lysbeth Penner, [place] 02/27/17 2023

## 2017-02-27 NOTE — ED Triage Notes (Signed)
Pt here for oral swelling after a fall and striking mouth on a table last night. Teeth intact.

## 2017-02-28 MED FILL — AMOX-CLAV 875-125 MG TABLET: 875-125 | 10 days supply | Qty: 20 | Fill #0

## 2017-03-26 ENCOUNTER — Encounter: Payer: Self-pay | Admitting: Physical Medicine & Rehabilitation

## 2017-03-27 ENCOUNTER — Ambulatory Visit (INDEPENDENT_AMBULATORY_CARE_PROVIDER_SITE_OTHER): Payer: Self-pay | Admitting: Physician Assistant

## 2017-04-02 ENCOUNTER — Ambulatory Visit (INDEPENDENT_AMBULATORY_CARE_PROVIDER_SITE_OTHER): Payer: Self-pay | Admitting: Physician Assistant

## 2017-04-10 ENCOUNTER — Ambulatory Visit (INDEPENDENT_AMBULATORY_CARE_PROVIDER_SITE_OTHER): Payer: Self-pay | Admitting: Physician Assistant

## 2017-04-10 ENCOUNTER — Ambulatory Visit (HOSPITAL_COMMUNITY): Payer: Self-pay | Admitting: Psychiatry

## 2017-04-13 ENCOUNTER — Encounter: Payer: Self-pay | Attending: Physical Medicine & Rehabilitation

## 2017-04-13 ENCOUNTER — Encounter: Payer: Self-pay | Admitting: Physical Medicine & Rehabilitation

## 2017-04-13 ENCOUNTER — Ambulatory Visit (HOSPITAL_BASED_OUTPATIENT_CLINIC_OR_DEPARTMENT_OTHER): Payer: Self-pay | Admitting: Physical Medicine & Rehabilitation

## 2017-04-13 VITALS — BP 102/69 | HR 89

## 2017-04-13 DIAGNOSIS — M545 Low back pain: Secondary | ICD-10-CM | POA: Insufficient documentation

## 2017-04-13 DIAGNOSIS — G8929 Other chronic pain: Secondary | ICD-10-CM | POA: Insufficient documentation

## 2017-04-13 DIAGNOSIS — M4310 Spondylolisthesis, site unspecified: Secondary | ICD-10-CM

## 2017-04-13 DIAGNOSIS — Z825 Family history of asthma and other chronic lower respiratory diseases: Secondary | ICD-10-CM | POA: Insufficient documentation

## 2017-04-13 DIAGNOSIS — Q760 Spina bifida occulta: Secondary | ICD-10-CM | POA: Insufficient documentation

## 2017-04-13 DIAGNOSIS — M533 Sacrococcygeal disorders, not elsewhere classified: Secondary | ICD-10-CM

## 2017-04-13 DIAGNOSIS — M5441 Lumbago with sciatica, right side: Secondary | ICD-10-CM | POA: Insufficient documentation

## 2017-04-13 HISTORY — DX: Other chronic pain: G89.29

## 2017-04-13 MED ORDER — TRAMADOL HCL 50 MG PO TABS: 50.0000 mg | ORAL_TABLET | Freq: Two times a day (BID) | ORAL | 0 refills | Status: DC

## 2017-04-13 MED FILL — traMADol HCL 50 MG TABS: 50 | 30 days supply | Qty: 60 | Fill #0

## 2017-04-13 NOTE — Progress Notes (Signed)
Subjective:    Patient ID: Wanda Crane, female    DOB: 1991-07-04, 26 y.o.   MRN: [id number]  HPI Chief complaint low back pain radiating to bilateral knees  Onset was approximately 1 year ago.  Patient states that it came on slowly, initially she felt like it was a pulled muscle. However, it has not improved. Patient gets episodes of reduced sensation in the right thigh, this occurs daily and last a few minutes. No abnormal sensations below the knee. Patient occasionally has similar episodes of reduced sensation in the left thigh, but this occurs less than daily. No bowel or bladder complaints.  Tramadol has partial benefit, did not get any beneficial effect from over-the-counter medications such as Tylenol or Aleve.  Patient underwent physical therapy at [facility] location from 5 8 through 12/28/2016. Patient notes that there was no improvement, notes from primary care 12/27/2016. Indicate 20% reduction of pain with PT also 20% reduction in pain with gabapentin 600 mg 3 times a day.  Pain Inventory Average Pain 9 Pain Right Now 10 My pain is sharp, burning, stabbing, tingling and aching  In the last 24 hours, has pain interfered with the following? General activity 8 Relation with others 8 Enjoyment of life 7 What TIME of day is your pain at its worst? all Sleep (in general) Poor  Pain is worse with: walking, bending, sitting, standing and some activites Pain improves with: pacing activities Relief from Meds: 2  Mobility ability to climb steps?  no do you drive?  yes  Function not employed: date last employed .  Neuro/Psych tingling trouble walking depression  Prior Studies Any changes since last visit?  no LUMBAR SPINE - COMPLETE 4+ VIEW  COMPARISON:  08/14/2014  FINDINGS: There are 5 nonrib bearing lumbar-type vertebral bodies.  The vertebral body heights are maintained.  There is minimal grade 1 anterolisthesis of L5 on S1 secondary  to bilateral chronic L5 pars interarticularis defects. 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  There is no acute fracture.  There is mild degenerative disc disease with disc height loss at L5-S1.  The SI joints are unremarkable.  IMPRESSION: Stable grade 1 anterolisthesis of L5 on S1 secondary to bilateral L5 pars interarticularis defects.   Electronically Signed   By: Kathreen Devoid   On: 11/13/2016 14:07 Physicians involved in your care Any changes since last visit?  no   Family History  Problem Relation Age of Onset  . Asthma Mother   . COPD Mother   . Diabetes Maternal Grandmother   . Heart disease Maternal Grandmother   . Stroke Maternal Grandmother   . Diabetes Maternal Grandfather   . Heart disease Maternal Grandfather   . Stroke Maternal Grandfather    Social History   Social History  . Marital status: Single    Spouse name: N/A  . Number of children: N/A  . Years of education: N/A   Social History Main Topics  . Smoking status: Never Smoker  . Smokeless tobacco: Never Used  . Alcohol use No  . Drug use: No  . Sexual activity: Yes    Birth control/ protection: None   Other Topics Concern  . None   Social History Narrative  . None   No past surgical history on file. No past medical history on file. BP 102/69   Pulse 89   SpO2 97%   Opioid Risk Score:   Fall Risk Score:  `1  Depression screen PHQ  2/9  Depression screen PHQ 2/9 02/22/2017 12/27/2016 11/27/2016 11/13/2016 10/16/2016 09/20/2015 04/19/2015  Decreased Interest 0 0 0 0 0 0 0  Down, Depressed, Hopeless 0 0 0 0 0 0 0  PHQ - 2 Score 0 0 0 0 0 0 0    Review of Systems  Constitutional: Positive for diaphoresis.  HENT: Negative.   Eyes: Negative.   Respiratory: Negative.   Cardiovascular: Negative.   Gastrointestinal: Negative.   Endocrine: Negative.   Genitourinary: Negative.   Musculoskeletal: Negative.   Skin: Negative.   Allergic/Immunologic: Negative.    Neurological: Negative.   Hematological: Negative.   Psychiatric/Behavioral: Negative.   All other systems reviewed and are negative.      Objective:   Physical Exam  Constitutional: She is oriented to person, place, and time. She appears well-developed and well-nourished.  HENT:  Head: Normocephalic and atraumatic.  Eyes: Pupils are equal, round, and reactive to light. Conjunctivae and EOM are normal.  Neck: Normal range of motion.  Neurological: She is alert and oriented to person, place, and time.  Skin:  Hair tuft over the lumbosacral junction midline  Psychiatric: She has a normal mood and affect.  Nursing note and vitals reviewed.  motor strength is 5/5 bilateral deltoid, biceps, triceps, grip, hip flexor, knee extensor and dorsiflexor. Sensation is intact to light touch as well as pinprick bilateral C5, C6, C7, C8, L2, L3, L4, L5-S1 dermatome distribution. Negative straight leg raising. Positive leg length discrepancy, right heel is 1-2 cm off the ground when hips are level Positive favors causing pain in the right SI joint.     Assessment & Plan:  1. 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 The spina bifida occulta is  Incidental Would recommend sacroiliac injection Tramadol 50 mg twice a day until injection is performed Call in Valium 5 mg prior to injection  If sacroiliac injection is not helpful, would recommend MRI given that she has not had any benefit with physical therapy I do not think any analgesics stronger than tramadol will be necessary

## 2017-04-13 NOTE — Patient Instructions (Signed)
Sacroiliac (SI) Joint Injection Patient Information  Description: The sacroiliac joint connects the scrum (very low back and tailbone) to the ilium (a pelvic bone which also forms half of the hip joint).  Normally this joint experiences very little motion.  When this joint becomes inflamed or unstable low back and or hip and pelvis pain may result.  Injection of this joint with local anesthetics (numbing medicines) and steroids can provide diagnostic information and reduce pain.  This injection is performed with the aid of x-ray guidance into the tailbone area while you are lying on your stomach.   You may experience an electrical sensation down the leg while this is being done.  You may also experience numbness.  We also may ask if we are reproducing your normal pain during the injection.  Conditions which may be treated SI injection:   Low back, buttock, hip or leg pain  Preparation for the Injection:  1. Do not eat any solid food or dairy products within 8 hours of your appointment.  2. You may drink clear liquids up to 3 hours before appointment.  Clear liquids include water, black coffee, juice or soda.  No milk or cream please. 3. You may take your regular medications, including pain medications with a sip of water before your appointment.  Diabetics should hold regular insulin (if take separately) and take 1/2 normal NPH dose the morning of the procedure.  Carry some sugar containing items with you to your appointment. 4. A driver must accompany you and be prepared to drive you home after your procedure. 5. Bring all of your current medications with you. 6. An IV may be inserted and sedation may be given at the discretion of the physician. 7. A blood pressure cuff, EKG and other monitors will often be applied during the procedure.  Some patients may need to have extra oxygen administered for a short period.  8. You will be asked to provide medical information, including your allergies,  prior to the procedure.  We must know immediately if you are taking blood thinners (like Coumadin/Warfarin) or if you are allergic to IV iodine contrast (dye).  We must know if you could possible be pregnant.  Possible side effects:   Bleeding from needle site  Infection (rare, may require surgery)  Nerve injury (rare)  Numbness & tingling (temporary)  A brief convulsion or seizure  Light-headedness (temporary)  Pain at injection site (several days)  Decreased blood pressure (temporary)  Weakness in the leg (temporary)   Call if you experience:   New onset weakness or numbness of an extremity below the injection site that last more than 8 hours.  Hives or difficulty breathing ( go to the emergency room)  Inflammation or drainage at the injection site  Any new symptoms which are concerning to you  Please note:  Although the local anesthetic injected can often make your back/ hip/ buttock/ leg feel good for several hours after the injections, the pain will likely return.  It takes 3-7 days for steroids to work in the sacroiliac area.  You may not notice any pain relief for at least that one week.  If effective, we will often do a series of three injections spaced 3-6 weeks apart to maximally decrease your pain.  After the initial series, we generally will wait some months before a repeat injection of the same type.  If you have any questions, please call [phone] [facility]   

## 2017-05-02 ENCOUNTER — Telehealth: Payer: Self-pay | Admitting: *Deleted

## 2017-05-02 MED ORDER — DIAZEPAM 5 MG PO TABS: ORAL_TABLET | ORAL | 0 refills | Status: DC

## 2017-05-02 NOTE — Telephone Encounter (Signed)
Requesting valium be called in for her back injection pre med.  Per Dr Letta Pate valium 5 mg po called to pharmacy to be taken one hour prior to injection.

## 2017-05-04 ENCOUNTER — Ambulatory Visit: Payer: Self-pay | Admitting: Physical Medicine & Rehabilitation

## 2017-05-15 ENCOUNTER — Other Ambulatory Visit: Payer: Self-pay

## 2017-05-15 DIAGNOSIS — M4310 Spondylolisthesis, site unspecified: Secondary | ICD-10-CM

## 2017-05-15 MED ORDER — TRAMADOL HCL 50 MG PO TABS: 50.0000 mg | ORAL_TABLET | Freq: Two times a day (BID) | ORAL | 0 refills | Status: DC

## 2017-05-15 MED FILL — traMADol HCL 50 MG TABS: 50 | 30 days supply | Qty: 60 | Fill #0

## 2017-05-15 NOTE — Telephone Encounter (Signed)
Call in tramadol 50 mg twice a day no refills, #60

## 2017-05-15 NOTE — Telephone Encounter (Signed)
Patient called requesting a refill of tramadol, has injections scheduled for 12th of October, is it ok to refill this medication

## 2017-05-18 ENCOUNTER — Ambulatory Visit: Payer: Self-pay | Admitting: Physical Medicine & Rehabilitation

## 2017-06-07 ENCOUNTER — Ambulatory Visit: Payer: Self-pay

## 2017-06-13 ENCOUNTER — Other Ambulatory Visit (INDEPENDENT_AMBULATORY_CARE_PROVIDER_SITE_OTHER): Payer: Self-pay | Admitting: Physician Assistant

## 2017-06-13 ENCOUNTER — Other Ambulatory Visit: Payer: Self-pay | Admitting: Physical Medicine & Rehabilitation

## 2017-06-13 ENCOUNTER — Telehealth: Payer: Self-pay | Admitting: Physical Medicine & Rehabilitation

## 2017-06-13 DIAGNOSIS — M4310 Spondylolisthesis, site unspecified: Secondary | ICD-10-CM

## 2017-06-13 NOTE — Telephone Encounter (Signed)
Last clinic note states take tramadol BID up until injection.  Injection was cancelled.  No future appointments made yet.  Please advise on refills

## 2017-06-13 NOTE — Telephone Encounter (Signed)
Pt phoned stated she would like a refill on her Tramadol and Gabapentin. Please advise.

## 2017-06-13 NOTE — Telephone Encounter (Signed)
FWD to PCP. Tempestt S Roberts, CMA  

## 2017-06-14 NOTE — Telephone Encounter (Signed)
No refills unless pt makes f/u visit

## 2017-06-15 ENCOUNTER — Other Ambulatory Visit (INDEPENDENT_AMBULATORY_CARE_PROVIDER_SITE_OTHER): Payer: Self-pay | Admitting: Physician Assistant

## 2017-06-15 DIAGNOSIS — M4310 Spondylolisthesis, site unspecified: Secondary | ICD-10-CM

## 2017-06-15 MED ORDER — TRAMADOL HCL 50 MG PO TABS: 50.0000 mg | ORAL_TABLET | Freq: Two times a day (BID) | ORAL | 0 refills | Status: DC

## 2017-06-15 MED FILL — traMADol HCL 50 MG TABS: 50 | 15 days supply | Qty: 30 | Fill #0

## 2017-06-15 NOTE — Telephone Encounter (Signed)
May provide prescription for tramadol 50 mg p.o. twice daily number 30,2-week supply with no refill

## 2017-06-15 NOTE — Telephone Encounter (Signed)
FWD to PCP. Tempestt S Roberts, CMA  

## 2017-06-15 NOTE — Telephone Encounter (Signed)
Medication ordered, please schedule appointment

## 2017-06-15 NOTE — Telephone Encounter (Signed)
Pt has [facility]) and states that she cannot get into their office to renew her card until 2 weeks. Please advise are you able to write her a Rx to last her for the two weeks until she can get in to renew her card then the appointment can be scheduled.

## 2017-06-18 ENCOUNTER — Ambulatory Visit: Payer: Self-pay

## 2017-07-06 ENCOUNTER — Other Ambulatory Visit: Payer: Self-pay

## 2017-07-06 ENCOUNTER — Encounter: Payer: Self-pay | Admitting: Physical Medicine & Rehabilitation

## 2017-07-06 ENCOUNTER — Ambulatory Visit (HOSPITAL_BASED_OUTPATIENT_CLINIC_OR_DEPARTMENT_OTHER): Payer: No Typology Code available for payment source | Admitting: Physical Medicine & Rehabilitation

## 2017-07-06 ENCOUNTER — Encounter: Payer: Self-pay | Attending: Physical Medicine & Rehabilitation

## 2017-07-06 VITALS — BP 109/75 | HR 95

## 2017-07-06 DIAGNOSIS — Z825 Family history of asthma and other chronic lower respiratory diseases: Secondary | ICD-10-CM | POA: Insufficient documentation

## 2017-07-06 DIAGNOSIS — G8929 Other chronic pain: Secondary | ICD-10-CM | POA: Insufficient documentation

## 2017-07-06 DIAGNOSIS — M4310 Spondylolisthesis, site unspecified: Secondary | ICD-10-CM

## 2017-07-06 DIAGNOSIS — Q760 Spina bifida occulta: Secondary | ICD-10-CM | POA: Insufficient documentation

## 2017-07-06 DIAGNOSIS — M533 Sacrococcygeal disorders, not elsewhere classified: Secondary | ICD-10-CM

## 2017-07-06 DIAGNOSIS — M545 Low back pain: Secondary | ICD-10-CM | POA: Insufficient documentation

## 2017-07-06 MED ORDER — DIAZEPAM 5 MG PO TABS: ORAL_TABLET | ORAL | 0 refills | Status: DC

## 2017-07-06 MED ORDER — TRAMADOL HCL 50 MG PO TABS: 50.0000 mg | ORAL_TABLET | Freq: Two times a day (BID) | ORAL | 3 refills | Status: DC

## 2017-07-06 MED FILL — traMADol HCL 50 MG TABS: 50 | 30 days supply | Qty: 60 | Fill #0

## 2017-07-06 NOTE — Progress Notes (Signed)
Subjective:    Patient ID: Wanda Crane, female    DOB: 1991-07-21, 26 y.o.   MRN: [id number] Patient underwent physical therapy at [facility] location from 5 8 through 12/28/2016. Patient notes that there was no improvement, notes from primary care 12/27/2016. Indicate 20% reduction of pain with PT also 20% reduction in pain with gabapentin 600 mg 3 times a day. HPI  Low back pain times 1 year.  This is located in the lower lumbar area upper buttock area.  She has pain bilaterally but worse on the right side. She has no significant radiation into her lower extremities no bowel or bladder dysfunction no progressive weakness in lower extremity. Pain Inventory Average Pain 9 Pain Right Now 9 My pain is sharp, tingling and aching  In the last 24 hours, has pain interfered with the following? General activity 2 Relation with others . Enjoyment of life 8 What TIME of day is your pain at its worst? all Sleep (in general) Poor  Pain is worse with: walking, bending, sitting, standing and some activites Pain improves with: medication Relief from Meds: 2  Mobility walk without assistance how many minutes can you walk? a few minutes ability to climb steps?  yes do you drive?  yes Do you have any goals in this area?  no  Function not employed: date last employed . I need assistance with the following:  meal prep, household duties and shopping  Neuro/Psych tingling trouble walking  Prior Studies Any changes since last visit?  no  Physicians involved in your care Any changes since last visit?  no   Family History  Problem Relation Age of Onset  . Asthma Mother   . COPD Mother   . Diabetes Maternal Grandmother   . Heart disease Maternal Grandmother   . Stroke Maternal Grandmother   . Diabetes Maternal Grandfather   . Heart disease Maternal Grandfather   . Stroke Maternal Grandfather    Social History   Socioeconomic History  . Marital status: Single   Spouse name: Not on file  . Number of children: Not on file  . Years of education: Not on file  . Highest education level: Not on file  Social Needs  . Financial resource strain: Not on file  . Food insecurity - worry: Not on file  . Food insecurity - inability: Not on file  . Transportation needs - medical: Not on file  . Transportation needs - non-medical: Not on file  Occupational History  . Not on file  Tobacco Use  . Smoking status: Never Smoker  . Smokeless tobacco: Never Used  Substance and Sexual Activity  . Alcohol use: No  . Drug use: No  . Sexual activity: Yes    Birth control/protection: None  Other Topics Concern  . Not on file  Social History Narrative  . Not on file   No past surgical history on file. No past medical history on file. There were no vitals taken for this visit.  Opioid Risk Score:  1 Fall Risk Score:  `1  Depression screen PHQ 2/9  Depression screen [facility] 2/9 07/06/2017 04/13/2017 02/22/2017 12/27/2016 11/27/2016 11/13/2016 10/16/2016  Decreased Interest 0 3 0 0 0 0 0  Down, Depressed, Hopeless 0 3 0 0 0 0 0  PHQ - 2 Score 0 6 0 0 0 0 0  Altered sleeping - 3 - - - - -  Tired, decreased energy - 3 - - - - -  Change in  appetite - 2 - - - - -  Feeling bad or failure about yourself  - 0 - - - - -  Trouble concentrating - 1 - - - - -  Moving slowly or fidgety/restless - 2 - - - - -  Suicidal thoughts - 0 - - - - -  PHQ-9 Score - 17 - - - - -  Difficult doing work/chores - Very difficult - - - - -     Review of Systems  Constitutional:       Night sweats  HENT: Negative.   Eyes: Negative.   Respiratory: Negative.   Cardiovascular: Negative.   Gastrointestinal: Negative.   Endocrine: Negative.   Genitourinary: Negative.   Musculoskeletal: Negative.   Skin: Negative.   Allergic/Immunologic: Negative.   Neurological: Negative.   Hematological: Negative.   Psychiatric/Behavioral: Negative.        Objective:   Physical Exam    Constitutional: She is oriented to person, place, and time. She appears well-developed and well-nourished. No distress.  HENT:  Head: Normocephalic and atraumatic.  Eyes: Conjunctivae and EOM are normal. Pupils are equal, round, and reactive to light.  Neck: Normal range of motion.  Neurological: She is alert and oriented to person, place, and time. She displays no tremor. She exhibits normal muscle tone. Coordination and gait normal.  Skin: She is not diaphoretic.  Psychiatric: She has a normal mood and affect. Her behavior is normal. Judgment and thought content normal.  Nursing note and vitals reviewed. Motor strength is 5/5 bilateral hip flexor knee extensor ankle Negative straight leg raise Sensation normal bilateral L3-L4 L5-S1 dermatome distribution pinprick. Deep tendon reflexes 1+ bilateral patella 1+ bilateral Achilles Lumbar spine has 75% range of motion flexion extension lateral bending and rotation         Assessment & Plan:  1.  Chronic lumbar pain 1 year duration no significant radicular component, x-rays revealing bilateral L5 pars interarticularis defects with a grade 1 spondylolisthesis L5 on S1. She has right-sided greater than left-sided pain in the buttocks.  Exam most consistent with sacroiliac dysfunction. Will start with sacroiliac injection on the right side and if this is not helpful consider L3-L4 medial branch and L5 dorsal ramus injections.

## 2017-08-03 ENCOUNTER — Ambulatory Visit: Payer: No Typology Code available for payment source | Admitting: Physical Medicine & Rehabilitation

## 2017-08-03 ENCOUNTER — Encounter: Payer: Self-pay | Attending: Physical Medicine & Rehabilitation

## 2017-08-03 DIAGNOSIS — G8929 Other chronic pain: Secondary | ICD-10-CM | POA: Insufficient documentation

## 2017-08-03 DIAGNOSIS — Q760 Spina bifida occulta: Secondary | ICD-10-CM | POA: Insufficient documentation

## 2017-08-03 DIAGNOSIS — M545 Low back pain: Secondary | ICD-10-CM | POA: Insufficient documentation

## 2017-08-03 DIAGNOSIS — Z825 Family history of asthma and other chronic lower respiratory diseases: Secondary | ICD-10-CM | POA: Insufficient documentation

## 2017-08-16 MED FILL — GABAPENTIN 300 MG CAPSULE: 300 | 30 days supply | Qty: 270 | Fill #1

## 2017-08-16 MED FILL — traMADol HCL 50 MG TABS: 50 | 30 days supply | Qty: 60 | Fill #1

## 2017-08-22 ENCOUNTER — Encounter (HOSPITAL_COMMUNITY): Payer: Self-pay | Admitting: Emergency Medicine

## 2017-08-22 DIAGNOSIS — Z79899 Other long term (current) drug therapy: Secondary | ICD-10-CM | POA: Insufficient documentation

## 2017-08-22 DIAGNOSIS — N83291 Other ovarian cyst, right side: Secondary | ICD-10-CM | POA: Insufficient documentation

## 2017-08-22 LAB — LIPASE, BLOOD: LIPASE: 24 U/L (ref 11–51)

## 2017-08-22 LAB — CBC
HCT: 37.7 % (ref 36.0–46.0)
Hemoglobin: 11.9 g/dL — ABNORMAL LOW (ref 12.0–15.0)
MCH: 25.8 pg — ABNORMAL LOW (ref 26.0–34.0)
MCHC: 31.6 g/dL (ref 30.0–36.0)
MCV: 81.6 fL (ref 78.0–100.0)
PLATELETS: 330 10*3/uL (ref 150–400)
RBC: 4.62 MIL/uL (ref 3.87–5.11)
RDW: 14.4 % (ref 11.5–15.5)
WBC: 12.2 10*3/uL — AB (ref 4.0–10.5)

## 2017-08-22 LAB — COMPREHENSIVE METABOLIC PANEL
ALT: 25 U/L (ref 14–54)
AST: 22 U/L (ref 15–41)
Albumin: 3.8 g/dL (ref 3.5–5.0)
Alkaline Phosphatase: 77 U/L (ref 38–126)
Anion gap: 8 (ref 5–15)
BILIRUBIN TOTAL: 0.5 mg/dL (ref 0.3–1.2)
BUN: 10 mg/dL (ref 6–20)
CO2: 23 mmol/L (ref 22–32)
CREATININE: 0.79 mg/dL (ref 0.44–1.00)
Calcium: 9.1 mg/dL (ref 8.9–10.3)
Chloride: 105 mmol/L (ref 101–111)
GFR calc non Af Amer: >60 mL/min (ref >60)
Glucose, Bld: 92 mg/dL (ref 65–99)
POTASSIUM: 3.7 mmol/L (ref 3.5–5.1)
Sodium: 136 mmol/L (ref 135–145)
TOTAL PROTEIN: 7.3 g/dL (ref 6.5–8.1)

## 2017-08-22 LAB — I-STAT BETA HCG BLOOD, ED (MC, WL, AP ONLY): I-stat hCG, quantitative: <5 m[IU]/mL (ref <5)

## 2017-08-22 NOTE — ED Triage Notes (Signed)
Patient c/o lower abd pain that has been constant since yesterday. Denies any n/v/d or urinary problems.

## 2017-08-23 ENCOUNTER — Emergency Department (HOSPITAL_COMMUNITY): Payer: Self-pay

## 2017-08-23 ENCOUNTER — Emergency Department (HOSPITAL_COMMUNITY)
Admission: EM | Admit: 2017-08-23 | Discharge: 2017-08-23 | Disposition: A | Discharge status: Home / Self Care | Payer: Self-pay | Attending: Emergency Medicine | Admitting: Emergency Medicine

## 2017-08-23 ENCOUNTER — Encounter (HOSPITAL_COMMUNITY): Payer: Self-pay

## 2017-08-23 DIAGNOSIS — N83209 Unspecified ovarian cyst, unspecified side: Secondary | ICD-10-CM

## 2017-08-23 DIAGNOSIS — R1031 Right lower quadrant pain: Secondary | ICD-10-CM

## 2017-08-23 LAB — URINALYSIS, ROUTINE W REFLEX MICROSCOPIC
Bilirubin Urine: NEGATIVE
Glucose, UA: NEGATIVE mg/dL
Hgb urine dipstick: NEGATIVE
Ketones, ur: NEGATIVE mg/dL
LEUKOCYTES UA: NEGATIVE
NITRITE: NEGATIVE
Protein, ur: NEGATIVE mg/dL
SPECIFIC GRAVITY, URINE: 1.008 (ref 1.005–1.030)
pH: 6 (ref 5.0–8.0)

## 2017-08-23 MED ORDER — MORPHINE SULFATE (PF) 4 MG/ML IV SOLN
4.0000 mg | Freq: Once | INTRAVENOUS | Status: AC
  Administered 2017-08-23: 4 mg via INTRAVENOUS
  Filled 2017-08-23: qty 1

## 2017-08-23 MED ORDER — HYDROCODONE-ACETAMINOPHEN 5-325 MG PO TABS: 2.0000 | ORAL_TABLET | ORAL | 0 refills | Status: DC | PRN

## 2017-08-23 MED ORDER — ONDANSETRON 8 MG PO TBDP: ORAL_TABLET | ORAL | 0 refills | Status: DC

## 2017-08-23 MED ORDER — IOPAMIDOL (ISOVUE-300) INJECTION 61%
INTRAVENOUS | Status: AC
  Administered 2017-08-23: 100 mL via INTRAVENOUS
  Filled 2017-08-23: qty 100

## 2017-08-23 MED ORDER — IOPAMIDOL (ISOVUE-300) INJECTION 61%
100.0000 mL | Freq: Once | INTRAVENOUS | Status: AC | PRN
  Administered 2017-08-23: 100 mL via INTRAVENOUS

## 2017-08-23 MED ORDER — HYDROCODONE-ACETAMINOPHEN 5-325 MG PO TABS
2.0000 | ORAL_TABLET | Freq: Once | ORAL | Status: AC
  Administered 2017-08-23: 2 via ORAL
  Filled 2017-08-23: qty 2

## 2017-08-23 MED ORDER — HYDROMORPHONE HCL 1 MG/ML IJ SOLN
1.0000 mg | Freq: Once | INTRAMUSCULAR | Status: AC
  Administered 2017-08-23: 1 mg via INTRAVENOUS
  Filled 2017-08-23: qty 1

## 2017-08-23 MED ORDER — SODIUM CHLORIDE 0.9 % IV BOLUS (SEPSIS)
1000.0000 mL | Freq: Once | INTRAVENOUS | Status: AC
  Administered 2017-08-23: 1000 mL via INTRAVENOUS

## 2017-08-23 NOTE — ED Provider Notes (Signed)
[facility] DEPT Provider Note   CSN: [id number] Arrival date & time: 08/22/17  1806     History   Chief Complaint Chief Complaint  Patient presents with  . Abdominal Pain    HPI Wanda Crane is a 27 y.o. female with a hx of chronic back pain presents to the Emergency Department complaining of gradual, persistent, progressively worsening right lower quadrant abdominal pain onset 48 hours ago.  Patient reports the pain is a 9/10, waxing and waning but never resolving.  She reports associated nausea without vomiting.  She reports normal bowel movements including today with formed stool.  No melena or hematochezia.  Patient reports she has been sexually active with one female partner for the last 6 years.  No history of STD.  She denies vaginal discharge, urinary symptoms, vaginal bleeding.  Patient reports that movement and palpation make her symptoms significantly worse.  Nothing really makes them better.  No previous abdominal surgeries.  The history is provided by the patient and medical records. No language interpreter was used.    History reviewed. No pertinent past medical history.  Patient Active Problem List   Diagnosis Date Noted  . Chronic right-sided low back pain with right-sided sciatica 04/13/2017  . Spondylolisthesis, grade 1 11/27/2016    History reviewed. No pertinent surgical history.  OB History    No data available       Home Medications    Prior to Admission medications   Medication Sig Start Date End Date Taking? Authorizing Provider  gabapentin (NEURONTIN) 300 MG capsule Take 3 capsules (900 mg total) by mouth 3 (three) times daily. 02/22/17 08/23/17 Yes Clent Demark, PA-C  traMADol (ULTRAM) 50 MG tablet Take 1 tablet (50 mg total) by mouth 2 (two) times daily. Patient taking differently: Take 50 mg by mouth every 6 (six) hours as needed for moderate pain.  07/06/17  Yes Kirsteins, Luanna Salk, MD  diazepam (VALIUM) 5 MG  tablet Take 5 mg tablet po one hour prior to injection. Patient not taking: Reported on 08/23/2017 07/06/17   Charlett Blake, MD  HYDROcodone-acetaminophen (NORCO/VICODIN) 5-325 MG tablet Take 2 tablets by mouth every 4 (four) hours as needed. 08/23/17   Malosi Hemstreet, Jarrett Soho, PA-C  naproxen (NAPROSYN) 500 MG tablet Take 1 tablet (500 mg total) by mouth 2 (two) times daily with a meal. Patient not taking: Reported on 08/23/2017 02/22/17   Clent Demark, PA-C  ondansetron [facility] ODT) 8 MG disintegrating tablet 8mg  ODT q4 hours prn nausea 08/23/17   Ralphael Southgate, Jarrett Soho, PA-C    Family History Family History  Problem Relation Age of Onset  . Asthma Mother   . COPD Mother   . Diabetes Maternal Grandmother   . Heart disease Maternal Grandmother   . Stroke Maternal Grandmother   . Diabetes Maternal Grandfather   . Heart disease Maternal Grandfather   . Stroke Maternal Grandfather     Social History Social History   Tobacco Use  . Smoking status: Never Smoker  . Smokeless tobacco: Never Used  Substance Use Topics  . Alcohol use: No  . Drug use: No     Allergies   Patient has no known allergies.   Review of Systems Review of Systems  Constitutional: Negative for appetite change, diaphoresis, fatigue, fever and unexpected weight change.  HENT: Negative for mouth sores.   Eyes: Negative for visual disturbance.  Respiratory: Negative for cough, chest tightness, shortness of breath and wheezing.   Cardiovascular: Negative for chest  pain.  Gastrointestinal: Positive for abdominal pain and nausea. Negative for constipation, diarrhea and vomiting.  Endocrine: Negative for polydipsia, polyphagia and polyuria.  Genitourinary: Negative for dysuria, frequency, hematuria and urgency.  Musculoskeletal: Negative for back pain and neck stiffness.  Skin: Negative for rash.  Allergic/Immunologic: Negative for immunocompromised state.  Neurological: Negative for syncope,  light-headedness and headaches.  Hematological: Does not bruise/bleed easily.  Psychiatric/Behavioral: Negative for sleep disturbance. The patient is not nervous/anxious.      Physical Exam Updated Vital Signs BP 108/73 (BP Location: Right Arm)   Pulse 85   Temp 98.3 F (36.8 C) (Oral)   Resp 18   LMP 08/03/2017 Comment: negative beta HCG 08/22/17  SpO2 100%   Physical Exam  Constitutional: She appears well-developed and well-nourished. No distress.  Awake, alert, nontoxic appearance  HENT:  Head: Normocephalic and atraumatic.  Mouth/Throat: Oropharynx is clear and moist. No oropharyngeal exudate.  Eyes: Conjunctivae are normal. No scleral icterus.  Neck: Normal range of motion. Neck supple.  Cardiovascular: Normal rate, regular rhythm and intact distal pulses.  Pulmonary/Chest: Effort normal and breath sounds normal. No respiratory distress. She has no wheezes.  Equal chest expansion  Abdominal: Soft. Bowel sounds are normal. She exhibits no mass. There is tenderness in the right lower quadrant and suprapubic area. There is guarding. There is no rigidity, no rebound and no CVA tenderness.  Musculoskeletal: Normal range of motion. She exhibits no edema.  Neurological: She is alert.  Speech is clear and goal oriented Moves extremities without ataxia  Skin: Skin is warm and dry. She is not diaphoretic.  Psychiatric: She has a normal mood and affect.  Nursing note and vitals reviewed.    ED Treatments / Results  Labs (all labs ordered are listed, but only abnormal results are displayed) Labs Reviewed  CBC - Abnormal; Notable for the following components:      Result Value   WBC 12.2 (*)    Hemoglobin 11.9 (*)    MCH 25.8 (*)    All other components within normal limits  URINALYSIS, ROUTINE W REFLEX MICROSCOPIC - Abnormal; Notable for the following components:   Color, Urine STRAW (*)    All other components within normal limits  LIPASE, BLOOD  COMPREHENSIVE METABOLIC  PANEL  I-STAT BETA HCG BLOOD, ED ([facility], [facility], [facility] ONLY)    Radiology US Transvaginal Non-ob  Result Date: 08/23/2017 CLINICAL DATA:  27 year old female with right lower quadrant abdominal pain. EXAM: TRANSABDOMINAL AND TRANSVAGINAL ULTRASOUND OF PELVIS DOPPLER ULTRASOUND OF OVARIES TECHNIQUE: Both transabdominal and transvaginal ultrasound examinations of the pelvis were performed. Transabdominal technique was performed for global imaging of the pelvis including uterus, ovaries, adnexal regions, and pelvic cul-de-sac. It was necessary to proceed with endovaginal exam following the transabdominal exam to visualize the endometrium and ovaries. Color and duplex Doppler ultrasound was utilized to evaluate blood flow to the ovaries. COMPARISON:  CT of the abdomen pelvis dated 08/23/2017 FINDINGS: Uterus Measurements: 8.5 x 4.0 x 4.9 cm. No fibroids or other mass visualized. Endometrium Thickness: 9 mm.  No focal abnormality visualized. Right ovary Measurements: The right ovary measures 4.3 x 3.8 x 3.4 cm for a volume of 29 cc. There is a 3.2 x 2.9 x 2.7 cm complex cyst with low-level echogenic debris most likely a hemorrhagic cyst. A 1.4 x 1.2 x 1.7 cm simple cyst is also noted in the right ovary. Left ovary Measurements: The left ovary measures 3.1 x 2.4 x 2.6 cm for a volume of 10  cc. Normal appearance/no adnexal mass. Pulsed Doppler evaluation of both ovaries demonstrates normal low-resistance arterial and venous waveforms. Other findings No abnormal free fluid. IMPRESSION: 1. A hemorrhagic cyst as well as a simple cyst in the right ovary.Unremarkable left ovary. 2. Doppler detected flow to both ovaries. 3. Unremarkable uterus and endometrium. Electronically Signed   By: Anner Crete M.D.   On: 08/23/2017 04:53   US Pelvis Complete  Result Date: 08/23/2017 CLINICAL DATA:  27 year old female with right lower quadrant abdominal pain. EXAM: TRANSABDOMINAL AND TRANSVAGINAL ULTRASOUND OF PELVIS DOPPLER ULTRASOUND  OF OVARIES TECHNIQUE: Both transabdominal and transvaginal ultrasound examinations of the pelvis were performed. Transabdominal technique was performed for global imaging of the pelvis including uterus, ovaries, adnexal regions, and pelvic cul-de-sac. It was necessary to proceed with endovaginal exam following the transabdominal exam to visualize the endometrium and ovaries. Color and duplex Doppler ultrasound was utilized to evaluate blood flow to the ovaries. COMPARISON:  CT of the abdomen pelvis dated 08/23/2017 FINDINGS: Uterus Measurements: 8.5 x 4.0 x 4.9 cm. No fibroids or other mass visualized. Endometrium Thickness: 9 mm.  No focal abnormality visualized. Right ovary Measurements: The right ovary measures 4.3 x 3.8 x 3.4 cm for a volume of 29 cc. There is a 3.2 x 2.9 x 2.7 cm complex cyst with low-level echogenic debris most likely a hemorrhagic cyst. A 1.4 x 1.2 x 1.7 cm simple cyst is also noted in the right ovary. Left ovary Measurements: The left ovary measures 3.1 x 2.4 x 2.6 cm for a volume of 10 cc. Normal appearance/no adnexal mass. Pulsed Doppler evaluation of both ovaries demonstrates normal low-resistance arterial and venous waveforms. Other findings No abnormal free fluid. IMPRESSION: 1. A hemorrhagic cyst as well as a simple cyst in the right ovary.Unremarkable left ovary. 2. Doppler detected flow to both ovaries. 3. Unremarkable uterus and endometrium. Electronically Signed   By: Anner Crete M.D.   On: 08/23/2017 04:53   Ct Abdomen Pelvis W Contrast  Result Date: 08/23/2017 CLINICAL DATA:  27 year old female with abdominal distention and pain. EXAM: CT ABDOMEN AND PELVIS WITH CONTRAST TECHNIQUE: Multidetector CT imaging of the abdomen and pelvis was performed using the standard protocol following bolus administration of intravenous contrast. CONTRAST:  100 cc Isovue 370 COMPARISON:  Lumbar spine radiograph dated 11/13/2016 FINDINGS: Lower chest: The visualized lung bases are clear. No  intra-free air or free fluid. Hepatobiliary: There is fatty infiltration of the liver. No intrahepatic biliary ductal dilatation. The gallbladder is unremarkable. Pancreas: Unremarkable. No pancreatic ductal dilatation or surrounding inflammatory changes. Spleen: Normal in size without focal abnormality. Adrenals/Urinary Tract: The adrenal glands are unremarkable. A subcentimeter left renal interpolar hypodense lesion is not well characterized, likely cysts. There is uniform enhancement of the renal parenchyma. No hydronephrosis. The visualized ureters appear unremarkable. The urinary bladder is predominantly flaccid but grossly unremarkable. Stomach/Bowel: There is no bowel obstruction or active inflammation. Normal appendix. Vascular/Lymphatic: The abdominal aorta and IVC appear unremarkable. No portal venous gas. There is no adenopathy. Reproductive: The uterus is anteverted and grossly unremarkable. There is a 3.3 cm dominant follicle or corpus luteum in the right ovary. The left ovary is unremarkable. Other: None Musculoskeletal: Bilateral L5 pars defects. No listhesis. No acute osseous pathology. IMPRESSION: 1. A 3.3 cm dominant follicle or corpus luteum in the right ovary. 2. Mild fatty liver. 3. No bowel obstruction or active inflammation.  Normal appendix. Electronically Signed   By: Anner Crete M.D.   On: 08/23/2017 02:49  [place] Art/ven Flow Abd Pelv Doppler  Result Date: 08/23/2017 CLINICAL DATA:  27 year old female with right lower quadrant abdominal pain. EXAM: TRANSABDOMINAL AND TRANSVAGINAL ULTRASOUND OF PELVIS DOPPLER ULTRASOUND OF OVARIES TECHNIQUE: Both transabdominal and transvaginal ultrasound examinations of the pelvis were performed. Transabdominal technique was performed for global imaging of the pelvis including uterus, ovaries, adnexal regions, and pelvic cul-de-sac. It was necessary to proceed with endovaginal exam following the transabdominal exam to visualize the endometrium and  ovaries. Color and duplex Doppler ultrasound was utilized to evaluate blood flow to the ovaries. COMPARISON:  CT of the abdomen pelvis dated 08/23/2017 FINDINGS: Uterus Measurements: 8.5 x 4.0 x 4.9 cm. No fibroids or other mass visualized. Endometrium Thickness: 9 mm.  No focal abnormality visualized. Right ovary Measurements: The right ovary measures 4.3 x 3.8 x 3.4 cm for a volume of 29 cc. There is a 3.2 x 2.9 x 2.7 cm complex cyst with low-level echogenic debris most likely a hemorrhagic cyst. A 1.4 x 1.2 x 1.7 cm simple cyst is also noted in the right ovary. Left ovary Measurements: The left ovary measures 3.1 x 2.4 x 2.6 cm for a volume of 10 cc. Normal appearance/no adnexal mass. Pulsed Doppler evaluation of both ovaries demonstrates normal low-resistance arterial and venous waveforms. Other findings No abnormal free fluid. IMPRESSION: 1. A hemorrhagic cyst as well as a simple cyst in the right ovary.Unremarkable left ovary. 2. Doppler detected flow to both ovaries. 3. Unremarkable uterus and endometrium. Electronically Signed   By: Anner Crete M.D.   On: 08/23/2017 04:53    Procedures Procedures (including critical care time)  Medications Ordered in ED Medications  sodium chloride 0.9 % bolus 1,000 mL (0 mLs Intravenous Stopped 08/23/17 0411)  morphine 4 MG/ML injection 4 mg (4 mg Intravenous Given 08/23/17 0244)  iopamidol (ISOVUE-300) 61 % injection 100 mL (100 mLs Intravenous Contrast Given 08/23/17 0227)  HYDROmorphone (DILAUDID) injection 1 mg (1 mg Intravenous Given 08/23/17 0436)  HYDROcodone-acetaminophen (NORCO/VICODIN) 5-325 MG per tablet 2 tablet (2 tablets Oral Given 08/23/17 0509)     Initial Impression / Assessment and Plan / ED Course  I have reviewed the triage vital signs and the nursing notes.  Pertinent labs & imaging results that were available during my care of the patient were reviewed by me and considered in my medical decision making (see chart for details).      Presents with right lower quadrant abdominal pain and guarding on exam.  Concern for possible appendicitis.  Leukocytosis is noted.  Negative pregnancy test.  No evidence of urinary tract infection.  Patient with minimal pain relief due to morphine.  CT scan shows no evidence of appendicitis but does show 3.3 cm dominant follicle or corpus luteum of the right ovary.  Concern for possible ovarian cyst.  Will obtain ultrasound.  Ultrasound shows hemorrhagic cyst.  Patient is hemodynamically stable.  Her vitals are within normal limits.  Hemoglobin is 11.9.  Mild leukocytosis.  Repeat exam shows abdomen is soft.  Patient's pain is improved.  She is tolerated p.o. without difficulty.  Discussed findings and need for close follow-up.  Also discussed importance of immediate return to emergency department for syncope, worsening abdominal pain, fevers, vomiting or other concerns.  Patient states understanding and is in agreement with this plan.  Final Clinical Impressions(s) / ED Diagnoses   Final diagnoses:  Hemorrhagic ovarian cyst  RLQ abdominal pain    ED Discharge Orders        Ordered  HYDROcodone-acetaminophen (NORCO/VICODIN) 5-325 MG tablet  Every 4 hours PRN     08/23/17 0505    ondansetron (ZOFRAN ODT) 8 MG disintegrating tablet     08/23/17 0505       Kaliyah Gladman, Gwenlyn Perking 08/23/17 0602    Palumbo, April, MD 08/23/17 1696

## 2017-08-23 NOTE — Discharge Instructions (Signed)
1. Medications: zofran, vicodin, usual home medications 2. Treatment: rest, drink plenty of fluids, advance diet slowly 3. Follow Up: Please followup with your primary doctor in 2 days for discussion of your diagnoses and further evaluation after today's visit; if you do not have a primary care doctor use the resource guide provided to find one; Please return to the ER for persistent vomiting, high fevers or worsening symptoms  

## 2017-08-24 MED FILL — ONDANSETRON ODT 8 MG TABLET: 8 | 1 days supply | Qty: 4 | Fill #0

## 2017-09-17 MED FILL — traMADol HCL 50 MG TABS: 50 | 30 days supply | Qty: 60 | Fill #2

## 2017-09-18 ENCOUNTER — Encounter (INDEPENDENT_AMBULATORY_CARE_PROVIDER_SITE_OTHER): Payer: Self-pay | Admitting: Physician Assistant

## 2017-09-18 ENCOUNTER — Ambulatory Visit (INDEPENDENT_AMBULATORY_CARE_PROVIDER_SITE_OTHER): Payer: Self-pay | Admitting: Physician Assistant

## 2017-09-18 VITALS — BP 94/67 | HR 84 | Temp 98.1°F | Resp 18 | Ht 63.0 in | Wt 216.0 lb

## 2017-09-18 DIAGNOSIS — M4310 Spondylolisthesis, site unspecified: Secondary | ICD-10-CM

## 2017-09-18 DIAGNOSIS — Z23 Encounter for immunization: Secondary | ICD-10-CM

## 2017-09-18 DIAGNOSIS — K0889 Other specified disorders of teeth and supporting structures: Secondary | ICD-10-CM

## 2017-09-18 MED ORDER — AMOXICILLIN-POT CLAVULANATE 875-125 MG PO TABS: 1.0000 | ORAL_TABLET | Freq: Two times a day (BID) | ORAL | 0 refills | Status: DC

## 2017-09-18 MED ORDER — NAPROXEN 500 MG PO TABS: 500.0000 mg | ORAL_TABLET | Freq: Two times a day (BID) | ORAL | 0 refills | Status: DC

## 2017-09-18 MED ORDER — GABAPENTIN 300 MG PO CAPS: 900.0000 mg | ORAL_CAPSULE | Freq: Three times a day (TID) | ORAL | 3 refills | Status: DC

## 2017-09-18 NOTE — Progress Notes (Signed)
Subjective:  Patient ID: Wanda Crane, female    DOB: [birth year]  Age: 27 y.o. MRN: [id number]  CC: f/u and referral to dentist  HPI  Wanda Crane a 27 y.o.femalewith grade 1 spondylolisthesis of L5-S1 presents on f/u for LBP. Went to physical medicine and rehabilitation 2.5 months ago and was advised the following:  1.  Chronic lumbar pain 1 year duration no significant radicular component, x-rays revealing bilateral L5 pars interarticularis defects with a grade 1 spondylolisthesis L5 on S1. She has right-sided greater than left-sided pain in the buttocks.  Exam most consistent with sacroiliac dysfunction. Will start with sacroiliac injection on the right side and if this is not helpful consider L3-L4 medial branch and L5 dorsal ramus injections.  Patient had not received injections yet due to having gone to [place] to be with her sick father. States that she will call the rehabilitation office to schedule for injections. Pain is temporarily controlled with Tramadol 50 mg BID prescribed by Dr. Letta Pate at rehab.      Patient would like a referral to dentistry due to pain from wisdom tooth eruption. Pain onset one month ago. Painful to chew and drink. Reports swelling of the right side of face last week that eventually self resolved without treatment.  Does not endorse f/c/n/v, current swelling, CP, palpitations, SOB, HA, abdominal pain, rash, or GI/GU sxs.    Outpatient Medications Prior to Visit  Medication Sig Dispense Refill  . gabapentin (NEURONTIN) 300 MG capsule Take 3 capsules (900 mg total) by mouth 3 (three) times daily. 270 capsule 3  . traMADol (ULTRAM) 50 MG tablet Take 1 tablet (50 mg total) by mouth 2 (two) times daily. (Patient taking differently: Take 50 mg by mouth every 6 (six) hours as needed for moderate pain. ) 60 tablet 3  . HYDROcodone-acetaminophen (NORCO/VICODIN) 5-325 MG tablet Take 2 tablets by mouth every 4 (four) hours as needed. (Patient not taking:  Reported on 09/18/2017) 6 tablet 0  . naproxen (NAPROSYN) 500 MG tablet Take 1 tablet (500 mg total) by mouth 2 (two) times daily with a meal. (Patient not taking: Reported on 08/23/2017) 60 tablet 0  . ondansetron (ZOFRAN ODT) 8 MG disintegrating tablet 8mg  ODT q4 hours prn nausea (Patient not taking: Reported on 09/18/2017) 4 tablet 0  . diazepam (VALIUM) 5 MG tablet Take 5 mg tablet po one hour prior to injection. (Patient not taking: Reported on 08/23/2017) 1 tablet 0   No facility-administered medications prior to visit.      ROS Review of Systems  Constitutional: Negative for chills, fever and malaise/fatigue.  HENT:       Tooth pain  Eyes: Negative for blurred vision.  Respiratory: Negative for shortness of breath.   Cardiovascular: Negative for chest pain and palpitations.  Gastrointestinal: Negative for abdominal pain and nausea.  Genitourinary: Negative for dysuria and hematuria.  Musculoskeletal: Positive for back pain. Negative for joint pain and myalgias.  Skin: Negative for rash.  Neurological: Negative for tingling and headaches.  Psychiatric/Behavioral: Negative for depression. The patient is not nervous/anxious.     Objective:  BP 94/67 (BP Location: Left Arm, Patient Position: Sitting, Cuff Size: Large)   Pulse 84   Temp 98.1 F (36.7 C) (Oral)   Resp 18   Ht 5\' 3"  (1.6 m)   Wt 216 lb (98 kg)   LMP 08/26/2017   SpO2 97%   BMI 38.26 kg/m   BP/Weight 09/18/2017 08/23/2017 61/60/7371  Systolic BP 94 062 694  Diastolic  BP 67 72 75  Wt. (Lbs) 216 - -  BMI 38.26 - -      Physical Exam  Constitutional: She is oriented to person, place, and time.  Well developed, overweight, NAD, polite  HENT:  Head: Normocephalic and atraumatic.  Impaction and eruption of tooth number 1. No abscess or bleeding noted. No facial, throat, tongue, or neck swelling.  Eyes: No scleral icterus.  Neck: Normal range of motion. Neck supple.  Pulmonary/Chest: Effort normal.   Musculoskeletal: She exhibits no edema.  Lymphadenopathy:    She has no cervical adenopathy.  Neurological: She is alert and oriented to person, place, and time. No cranial nerve deficit. Coordination normal.  Skin: Skin is warm and dry. No rash noted. No erythema. No pallor.  Psychiatric: She has a normal mood and affect. Her behavior is normal. Thought content normal.  Vitals reviewed.    Assessment & Plan:    1. Spondylolisthesis, grade 1 - naproxen (NAPROSYN) 500 MG tablet; Take 1 tablet (500 mg total) by mouth 2 (two) times daily with a meal.  Dispense: 60 tablet; Refill: 0 - gabapentin (NEURONTIN) 300 MG capsule; Take 3 capsules (900 mg total) by mouth 3 (three) times daily.  Dispense: 270 capsule; Refill: 3  2. Odontalgia - naproxen (NAPROSYN) 500 MG tablet; Take 1 tablet (500 mg total) by mouth 2 (two) times daily with a meal.  Dispense: 60 tablet; Refill: 0 - amoxicillin-clavulanate (AUGMENTIN) 875-125 MG tablet; Take 1 tablet by mouth 2 (two) times daily.  Dispense: 20 tablet; Refill: 0 - Ambulatory referral to Dentistry  3. Need for prophylactic vaccination and inoculation against influenza - Flu Vaccine QUAD 6+ mos PF IM (Fluarix Quad PF)   Meds ordered this encounter  Medications  . naproxen (NAPROSYN) 500 MG tablet    Sig: Take 1 tablet (500 mg total) by mouth 2 (two) times daily with a meal.    Dispense:  60 tablet    Refill:  0    Order Specific Question:   Supervising Provider    Answer:   Tresa Garter [id number]  . gabapentin (NEURONTIN) 300 MG capsule    Sig: Take 3 capsules (900 mg total) by mouth 3 (three) times daily.    Dispense:  270 capsule    Refill:  3    Order Specific Question:   Supervising Provider    Answer:   Tresa Garter [id number]  . amoxicillin-clavulanate (AUGMENTIN) 875-125 MG tablet    Sig: Take 1 tablet by mouth 2 (two) times daily.    Dispense:  20 tablet    Refill:  0    Order Specific Question:   Supervising  Provider    Answer:   Tresa Garter [id number]    Follow-up: Return if symptoms worsen or fail to improve.   Clent Demark PA

## 2017-09-18 NOTE — Patient Instructions (Signed)
Pericoronitis Pericoronitis is redness, pain, and swelling (inflammation) of the gum that surrounds a tooth that has not come all the way through the gum (partially erupted or impacted). It occurs when food particles and bacteria get trapped between the gum and the tooth and cause an infection. Pericoronitis usually affects the bottom wisdom teeth. Wisdom teeth are also called third molars. These are the last teeth to erupt, usually when people are 77-56 years old. Pericoronitis is most common in people in their 56s. Pericoronitis may start suddenly (acute) and can cause pain and swelling. The infection can also spread to the soft tissues around the tooth and lower jaw. What are the causes? The bacteria that normally live in your mouth (anaerobic bacteria) are the usual cause of pericoronitis. What increases the risk? The main risk factor for pericoronitis is a wisdom tooth that is coming in slowly or sideways. Other risk factors include:  Poor dental care (oral hygiene).  An upper molar that irritates the lower molar when chewing (occlusal trauma).  Gum disease (gingivitis).  Lowered resistance to infection. Illness and pregnancy are among possible causes of this.  What are the signs or symptoms? Signs and symptoms of acute pericoronitis include:  Pain.  Redness and swelling of the gum.  Swelling of the jaw.  Bad breath.  Bad taste in the mouth.  Fever.  Stiff and painful jaw movement (trismus).  How is this diagnosed? Your dentist can usually diagnose pericoronitis by your symptoms. You will also have a dental exam including X-rays. How is this treated? Treatment of acute pericoronitis may include:  Injecting numbing medicine and germ-killing solution into the infected area, then opening the gum area over the tooth to flush out pus, bacteria, and trapped food particles (debris).  Grinding down an upper tooth that is causing occlusal trauma.  Prescribing oral antibiotic  medicine.  If you have pericoronitis that keeps coming back, you may need to have the affected wisdom tooth removed (extracted). Follow these instructions at home:  Take medicines only as directed by your dentist.  If you were prescribed an antibiotic medicine, finish it all even if you start to feel better.  Eat soft foods until pain and swelling have gone away.  Rinse your mouth with warm salt water every few hours, or use a mouthwash that is recommended by your dentist.  Practice good oral hygiene by flossing and brushing frequently.  Keep all follow-up visits as directed by your dentist. This is important. Contact a health care provider if:  You have pain or swelling in your gum or jaw.  You have a fever.  You have a constant bad taste in your mouth or bad breath. Get help right away if: You have swelling in your mouth or jaw that makes it hard to swallow or breathe. This information is not intended to replace advice given to you by your health care provider. Make sure you discuss any questions you have with your health care provider. Document Released: 08/31/2004 Document Revised: 12/30/2015 Document Reviewed: 12/24/2013 Elsevier Interactive Patient Education  2018 [employer].

## 2017-09-26 ENCOUNTER — Encounter: Payer: Self-pay | Admitting: Obstetrics and Gynecology

## 2017-09-26 ENCOUNTER — Ambulatory Visit (INDEPENDENT_AMBULATORY_CARE_PROVIDER_SITE_OTHER): Payer: Self-pay | Admitting: Obstetrics and Gynecology

## 2017-09-26 VITALS — BP 100/59 | HR 85 | Wt 212.6 lb

## 2017-09-26 DIAGNOSIS — Z3169 Encounter for other general counseling and advice on procreation: Secondary | ICD-10-CM

## 2017-09-26 DIAGNOSIS — Z113 Encounter for screening for infections with a predominantly sexual mode of transmission: Secondary | ICD-10-CM

## 2017-09-26 DIAGNOSIS — R1030 Lower abdominal pain, unspecified: Secondary | ICD-10-CM

## 2017-09-26 NOTE — Patient Instructions (Signed)
Start a women's once daily vitamin Have your partner get a Semen Analysis at [employer] When you want to get pregnant, try timed intercourse for six months and if you haven't gotten pregnant, call us for a fertility evaluation  The first day of your period is cycle day 1. Have sex starting on cycle day 10 and have sex every other day until cycle day 16. If you are a week late with your period, take a home pregnancy test.

## 2017-09-26 NOTE — Progress Notes (Signed)
Obstetrics and Gynecology New Patient Evaluation  Appointment Date: 09/26/2017  OBGYN Clinic: [facility]  Primary Care Provider: Clent Demark  Referring Provider: [facility]  Chief Complaint: follow up ED visit, abdominal pain, infertility questions  History of Present Illness: Shiva Karis is a 27 y.o. Caucasian G0 (Patient's last menstrual period was 09/03/2017 (within days).), seen for the above chief complaint. Her past medical history is significant for h/o trich, BMI 30s, chronic back pain  Patient went to [facility] ED on 1/17 for several day history of mid line and rlq pain, pressure and discomfort. No prior s/s similar to this. Work up there showed negative CT scan except for RO cyst and u/s showed a 3cm RO hemorrhagic cyst with overall ovary size 4cm and no e/o torsion or FF in the pelvis.   Patient states discomfort is better but still feels it and denies any abdominal pain, nausea, vomiting, fevers and chills  Patient has been with same partner for 6 years and has never gotten pregnant. That partner has a 70 y/o child and is healthy aside from issues with his spinal cord. They both don't smoke, drink, or illicit drugs. They have never tried timed intercourse.    Review of Systems:as noted in the History of Present Illness.  Past Medical History:  Past Medical History:  Diagnosis Date  . Chronic right-sided low back pain with right-sided sciatica 04/13/2017  . Hemorrhagic cyst of ovary   . Spondylolisthesis, grade 1 11/27/2016    Past Surgical History:  No past surgical history on file.  Past Obstetrical History:  OB History  Gravida Para Term Preterm AB Living  0 0 0 0 0 0  SAB TAB Ectopic Multiple Live Births  0 0 0 0 0        Past Gynecological History: As per HPI. Menarche age 28 Periods: qmonth, regular, no intermenstrual bleeding, lasts for about 4-5d and no heavy or painful History of Pap Smear(s): Yes.    Last pap 2017, which was NILM History of STI(s): Yes She is currently using nothing for contraception.   Social History:  Social History   Socioeconomic History  . Marital status: Single    Spouse name: Not on file  . Number of children: Not on file  . Years of education: Not on file  . Highest education level: Not on file  Social Needs  . Financial resource strain: Not on file  . Food insecurity - worry: Not on file  . Food insecurity - inability: Not on file  . Transportation needs - medical: Not on file  . Transportation needs - non-medical: Not on file  Occupational History  . Not on file  Tobacco Use  . Smoking status: Never Smoker  . Smokeless tobacco: Never Used  Substance and Sexual Activity  . Alcohol use: No  . Drug use: No  . Sexual activity: Yes    Birth control/protection: None  Other Topics Concern  . Not on file  Social History Narrative  . Not on file    Family History:  Family History  Problem Relation Age of Onset  . Asthma Mother   . COPD Mother   . Diabetes Maternal Grandmother   . Heart disease Maternal Grandmother   . Stroke Maternal Grandmother   . Diabetes Maternal Grandfather   . Heart disease Maternal Grandfather   . Stroke Maternal Grandfather    She denies any female cancers, bleeding or blood clotting disorders.  Medications [name] had no medications administered during this visit. Current Outpatient Medications  Medication Sig Dispense Refill  . gabapentin (NEURONTIN) 300 MG capsule Take 3 capsules (900 mg total) by mouth 3 (three) times daily. 270 capsule 3  . traMADol (ULTRAM) 50 MG tablet Take 1 tablet (50 mg total) by mouth 2 (two) times daily. (Patient taking differently: Take 50 mg by mouth every 6 (six) hours as needed for moderate pain. ) 60 tablet 3  . amoxicillin-clavulanate (AUGMENTIN) 875-125 MG tablet Take 1 tablet by mouth 2 (two) times daily. (Patient not taking: Reported on 09/26/2017) 20 tablet 0  .  naproxen (NAPROSYN) 500 MG tablet Take 1 tablet (500 mg total) by mouth 2 (two) times daily with a meal. (Patient not taking: Reported on 09/26/2017) 60 tablet 0   No current facility-administered medications for this visit.     Allergies Patient has no known allergies.   Physical Exam:  BP (!) 100/59 (BP Location: Left Arm, Patient Position: Sitting, Cuff Size: Large)   Pulse 85   Wt 212 lb 9.6 oz (96.4 kg)   LMP 09/03/2017 (Within Days)   BMI 37.66 kg/m  Body mass index is 37.66 kg/m. General appearance: Well nourished, well developed female in no acute distress.  Neck:  Supple, normal appearance, and no thyromegaly  Cardiovascular: normal s1 and s2.  No murmurs, rubs or gallops. Respiratory:  Clear to auscultation bilateral. Normal respiratory effort Abdomen: positive bowel sounds and no masses, hernias; diffusely non tender to palpation, non distended Neuro/Psych:  Normal mood and affect.  Skin:  Warm and dry.  Lymphatic:  No inguinal lymphadenopathy.   Pelvic exam: is not limited by body habitus EGBUS: within normal limits, Vagina: within normal limits and with no blood or discharge in the vault, Cervix: normal appearing cervix without tenderness, discharge or lesions. Uterus:  nonenlarged and non tender and Adnexa:  normal adnexa and no mass, fullness, tenderness Rectovaginal: deferred  Laboratory: ED labs with negative beta, cmp, lipase CBC Latest Ref Rng & Units 08/22/2017 04/19/2015  WBC 4.0 - 10.5 K/uL 12.2(H) 11.7(H)  Hemoglobin 12.0 - 15.0 g/dL 11.9(L) 12.2  Hematocrit 36.0 - 46.0 % 37.7 36.2  Platelets 150 - 400 K/uL 330 384     Radiology:  CLINICAL DATA:  26 year old female with right lower quadrant abdominal pain.  EXAM: TRANSABDOMINAL AND TRANSVAGINAL ULTRASOUND OF PELVIS  DOPPLER ULTRASOUND OF OVARIES  TECHNIQUE: Both transabdominal and transvaginal ultrasound examinations of the pelvis were performed. Transabdominal technique was performed  for global imaging of the pelvis including uterus, ovaries, adnexal regions, and pelvic cul-de-sac.  It was necessary to proceed with endovaginal exam following the transabdominal exam to visualize the endometrium and ovaries. Color and duplex Doppler ultrasound was utilized to evaluate blood flow to the ovaries.  COMPARISON:  CT of the abdomen pelvis dated 08/23/2017  FINDINGS: Uterus  Measurements: 8.5 x 4.0 x 4.9 cm. No fibroids or other mass visualized.  Endometrium  Thickness: 9 mm.  No focal abnormality visualized.  Right ovary  Measurements: The right ovary measures 4.3 x 3.8 x 3.4 cm for a volume of 29 cc. There is a 3.2 x 2.9 x 2.7 cm complex cyst with low-level echogenic debris most likely a hemorrhagic cyst. A 1.4 x 1.2 x 1.7 cm simple cyst is also noted in the right ovary.  Left ovary  Measurements: The left ovary measures 3.1 x 2.4 x 2.6 cm for a volume of 10 cc. Normal appearance/no adnexal mass.  Pulsed Doppler  evaluation of both ovaries demonstrates normal low-resistance arterial and venous waveforms.  Other findings  No abnormal free fluid.  IMPRESSION: 1. A hemorrhagic cyst as well as a simple cyst in the right ovary.Unremarkable left ovary. 2. Doppler detected flow to both ovaries. 3. Unremarkable uterus and endometrium.   Electronically Signed   By: Anner Crete M.D.   On: 08/23/2017 04:53  CLINICAL DATA:  27 year old female with right lower quadrant abdominal pain.  EXAM: TRANSABDOMINAL AND TRANSVAGINAL ULTRASOUND OF PELVIS  DOPPLER ULTRASOUND OF OVARIES  TECHNIQUE: Both transabdominal and transvaginal ultrasound examinations of the pelvis were performed. Transabdominal technique was performed for global imaging of the pelvis including uterus, ovaries, adnexal regions, and pelvic cul-de-sac.  It was necessary to proceed with endovaginal exam following the transabdominal exam to visualize the endometrium and  ovaries. Color and duplex Doppler ultrasound was utilized to evaluate blood flow to the ovaries.  COMPARISON:  CT of the abdomen pelvis dated 08/23/2017  FINDINGS: Uterus  Measurements: 8.5 x 4.0 x 4.9 cm. No fibroids or other mass visualized.  Endometrium  Thickness: 9 mm.  No focal abnormality visualized.  Right ovary  Measurements: The right ovary measures 4.3 x 3.8 x 3.4 cm for a volume of 29 cc. There is a 3.2 x 2.9 x 2.7 cm complex cyst with low-level echogenic debris most likely a hemorrhagic cyst. A 1.4 x 1.2 x 1.7 cm simple cyst is also noted in the right ovary.  Left ovary  Measurements: The left ovary measures 3.1 x 2.4 x 2.6 cm for a volume of 10 cc. Normal appearance/no adnexal mass.  Pulsed Doppler evaluation of both ovaries demonstrates normal low-resistance arterial and venous waveforms.  Other findings  No abnormal free fluid.  IMPRESSION: 1. A hemorrhagic cyst as well as a simple cyst in the right ovary.Unremarkable left ovary. 2. Doppler detected flow to both ovaries. 3. Unremarkable uterus and endometrium.   Electronically Signed   By: Anner Crete M.D.   On: 08/23/2017 04:53  Assessment: pt stable  Plan:  1. Lower abdominal pain D/w her that likely from resolving cyst. STI swab not done in ED so will get one today but told her that discomfort should continue to get better with time and no need for additional imaging or follow up as long as she continues to get better - Cervicovaginal ancillary only  2. Infertility counseling Recommend that he get a semen analysis and recommended [facility]. Also, d/w her to start a MVI and instructions on timed intercourse given. If SA negative and no pregnancy in six months, recommend evaluation at that time. Pt amenable to plan  RTC PRN  Durene Romans MD Attending [facility])   4-5d   6 years same partner Has 78  y/o

## 2017-09-28 LAB — CERVICOVAGINAL ANCILLARY ONLY
Chlamydia: NEGATIVE
Neisseria Gonorrhea: NEGATIVE
TRICH (WINDOWPATH): POSITIVE — AB

## 2017-10-01 ENCOUNTER — Other Ambulatory Visit: Payer: Self-pay | Admitting: Obstetrics and Gynecology

## 2017-10-01 MED ORDER — METRONIDAZOLE 500 MG PO TABS: ORAL_TABLET | ORAL | 0 refills | Status: DC

## 2017-10-15 MED FILL — traMADol HCL 50 MG TABS: 50 | 30 days supply | Qty: 60 | Fill #3

## 2017-10-29 ENCOUNTER — Encounter: Payer: Self-pay | Admitting: *Deleted

## 2017-10-30 ENCOUNTER — Ambulatory Visit (HOSPITAL_COMMUNITY)
Admission: EM | Admit: 2017-10-30 | Discharge: 2017-10-30 | Disposition: A | Discharge status: Home / Self Care | Payer: Self-pay | Attending: Family Medicine | Admitting: Family Medicine

## 2017-10-30 ENCOUNTER — Encounter (HOSPITAL_COMMUNITY): Payer: Self-pay | Admitting: Emergency Medicine

## 2017-10-30 DIAGNOSIS — A5901 Trichomonal vulvovaginitis: Secondary | ICD-10-CM | POA: Insufficient documentation

## 2017-10-30 DIAGNOSIS — A599 Trichomoniasis, unspecified: Secondary | ICD-10-CM

## 2017-10-30 DIAGNOSIS — Z202 Contact with and (suspected) exposure to infections with a predominantly sexual mode of transmission: Secondary | ICD-10-CM | POA: Insufficient documentation

## 2017-10-30 DIAGNOSIS — B9689 Other specified bacterial agents as the cause of diseases classified elsewhere: Secondary | ICD-10-CM | POA: Insufficient documentation

## 2017-10-30 LAB — POCT PREGNANCY, URINE: PREG TEST UR: NEGATIVE

## 2017-10-30 MED ORDER — METRONIDAZOLE 500 MG PO TABS: 2000.0000 mg | ORAL_TABLET | Freq: Once | ORAL | 0 refills | Status: AC

## 2017-10-30 MED FILL — ?METRONIDAZOLE 500MG TABS: 500 | 1 days supply | Qty: 4 | Fill #0

## 2017-10-30 NOTE — Discharge Instructions (Signed)
Flagyl as directed for trichomonas. As your preference, cytology sent, you will be contacted with any positive results that requires further treatment. Refrain from sexual activity and alcohol use for the next 7 days. Monitor for any worsening of symptoms, fever, abdominal pain, nausea, vomiting, to follow up for reevaluation.

## 2017-10-30 NOTE — ED Triage Notes (Signed)
Pt requesting STD screening; pt sts was positive for trichomonas as was not treated; pt denies sx

## 2017-10-30 NOTE — ED Provider Notes (Signed)
[place]    CSN: [id number] Arrival date & time: 10/30/17  1009     History   Chief Complaint Chief Complaint  Patient presents with  . Exposure to STD    HPI Wanda Crane is a 27 y.o. female.   27 year old female comes in for treatment of trichomonas and STD testing.  Patient states that she was recently tested for STDs and positive for trichomonas.  States she is asymptomatic, without abdominal pain, nausea, vomiting.  Denies fever, chills, night sweats.  Denies urinary symptoms such as frequency, dysuria, hematuria.  Denies vaginal discharge, itching/pain, spotting.  She has been sexually active with one female partner for the past 6 years, no condom use.  LMP 09/26/2017.  Patient states she would like retesting prior to treatment as she wants to make sure the result was not an error given she is asymptomatic.      Past Medical History:  Diagnosis Date  . Chronic right-sided low back pain with right-sided sciatica 04/13/2017  . Hemorrhagic cyst of ovary   . Spondylolisthesis, grade 1 11/27/2016    Patient Active Problem List   Diagnosis Date Noted  . Chronic right-sided low back pain with right-sided sciatica 04/13/2017  . Spondylolisthesis, grade 1 11/27/2016    History reviewed. No pertinent surgical history.  OB History    Gravida  0   Para  0   Term  0   Preterm  0   AB  0   Living  0     SAB  0   TAB  0   Ectopic  0   Multiple  0   Live Births  0            Home Medications    Prior to Admission medications   Medication Sig Start Date End Date Taking? Authorizing Provider  amoxicillin-clavulanate (AUGMENTIN) 875-125 MG tablet Take 1 tablet by mouth 2 (two) times daily. Patient not taking: Reported on 09/26/2017 09/18/17   Clent Demark, PA-C  gabapentin (NEURONTIN) 300 MG capsule Take 3 capsules (900 mg total) by mouth 3 (three) times daily. 09/18/17   Clent Demark, PA-C  metroNIDAZOLE (FLAGYL) 500 MG tablet Take 4  tablets (2,000 mg total) by mouth once for 1 dose. 10/30/17 10/30/17  Ok Edwards, PA-C  naproxen (NAPROSYN) 500 MG tablet Take 1 tablet (500 mg total) by mouth 2 (two) times daily with a meal. Patient not taking: Reported on 09/26/2017 09/18/17   Clent Demark, PA-C  traMADol (ULTRAM) 50 MG tablet Take 1 tablet (50 mg total) by mouth 2 (two) times daily. Patient taking differently: Take 50 mg by mouth every 6 (six) hours as needed for moderate pain.  07/06/17   Kirsteins, Luanna Salk, MD    Family History Family History  Problem Relation Age of Onset  . Asthma Mother   . COPD Mother   . Diabetes Maternal Grandmother   . Heart disease Maternal Grandmother   . Stroke Maternal Grandmother   . Diabetes Maternal Grandfather   . Heart disease Maternal Grandfather   . Stroke Maternal Grandfather     Social History Social History   Tobacco Use  . Smoking status: Never Smoker  . Smokeless tobacco: Never Used  Substance Use Topics  . Alcohol use: No  . Drug use: No     Allergies   Patient has no known allergies.   Review of Systems Review of Systems  Reason unable to perform ROS: See HPI as  above.     Physical Exam Triage Vital Signs ED Triage Vitals [10/30/17 1041]  Enc Vitals Group     BP 121/81     Pulse Rate 81     Resp 18     Temp 98.1 F (36.7 C)     Temp Source Oral     SpO2 100 %     Weight      Height      Head Circumference      Peak Flow      Pain Score      Pain Loc      Pain Edu?      Excl. in [place]?    No data found.  Updated Vital Signs BP 121/81 (BP Location: Right Arm)   Pulse 81   Temp 98.1 F (36.7 C) (Oral)   Resp 18   SpO2 100%   Physical Exam  Constitutional: She is oriented to person, place, and time. She appears well-developed and well-nourished. No distress.  HENT:  Head: Normocephalic and atraumatic.  Eyes: Pupils are equal, round, and reactive to light. Conjunctivae are normal.  Neurological: She is alert and oriented to person,  place, and time.     UC Treatments / Results  Labs (all labs ordered are listed, but only abnormal results are displayed) Labs Reviewed  POCT PREGNANCY, URINE  URINE CYTOLOGY ANCILLARY ONLY    EKG None Radiology No results found.  Procedures Procedures (including critical care time)  Medications Ordered in UC Medications - No data to display   Initial Impression / Assessment and Plan / UC Course  I have reviewed the triage vital signs and the nursing notes.  Pertinent labs & imaging results that were available during my care of the patient were reviewed by me and considered in my medical decision making (see chart for details).    Urine pregnancy negative. Flagyl as directed for trich. Cytology sent, patient will be contacted with any positive results that require additional treatment. Patient to refrain from sexual activity for the next 7 days. Return precautions given.   Final Clinical Impressions(s) / UC Diagnoses   Final diagnoses:  Trichomonas infection    ED Discharge Orders        Ordered    metroNIDAZOLE (FLAGYL) 500 MG tablet   Once     10/30/17 [address], [place] 10/30/17 1149

## 2017-10-31 LAB — URINE CYTOLOGY ANCILLARY ONLY
CHLAMYDIA, DNA PROBE: NEGATIVE
Neisseria Gonorrhea: NEGATIVE
Trichomonas: POSITIVE — AB

## 2017-11-02 LAB — URINE CYTOLOGY ANCILLARY ONLY
Bacterial vaginitis: NEGATIVE
Candida vaginitis: POSITIVE — AB

## 2017-11-03 ENCOUNTER — Telehealth (HOSPITAL_COMMUNITY): Payer: Self-pay | Admitting: Internal Medicine

## 2017-11-03 MED ORDER — FLUCONAZOLE 150 MG PO TABS: 150.0000 mg | ORAL_TABLET | Freq: Once | ORAL | 0 refills | Status: AC

## 2017-11-03 NOTE — Telephone Encounter (Signed)
Clinical staff, please let patient know that test for candida (yeast) was positive.  Prescription for fluconazole sent to the pharmacy of record, [facility] on [address]. Recheck for further evaluation if symptoms are not improving.   Please see also result note of 3/27 regarding positive trichomonas test.  LM

## 2017-11-05 MED FILL — FLUCONAZOLE 150 MG TABLET: 150 | 3 days supply | Qty: 2 | Fill #0

## 2017-11-06 ENCOUNTER — Telehealth (HOSPITAL_COMMUNITY): Payer: Self-pay

## 2017-11-06 NOTE — Telephone Encounter (Signed)
Pt contacted regarding positive std results.  Pt reports finishing all of her medication. Educated on safe sex practices. Verbalized understanding.

## 2017-11-08 ENCOUNTER — Ambulatory Visit (INDEPENDENT_AMBULATORY_CARE_PROVIDER_SITE_OTHER): Payer: No Typology Code available for payment source | Admitting: Obstetrics and Gynecology

## 2017-11-08 ENCOUNTER — Ambulatory Visit (INDEPENDENT_AMBULATORY_CARE_PROVIDER_SITE_OTHER): Payer: Self-pay | Admitting: Clinical

## 2017-11-08 DIAGNOSIS — Z1331 Encounter for screening for depression: Secondary | ICD-10-CM

## 2017-11-08 DIAGNOSIS — F39 Unspecified mood [affective] disorder: Secondary | ICD-10-CM

## 2017-11-08 NOTE — BH Specialist Note (Addendum)
Integrated Behavioral Health Initial Visit  MRN: [id number] Name: Wanda Crane  Number of [place] Clinician visits:: 1/6 Session Start time: 1:45  Session End time: 2:12 Total time: 30 minutes  Type of Service: [place] Interpretor:No. Interpretor Name and Language: n/a   Warm Hand Off Completed.       SUBJECTIVE: Wanda Crane is a 27 y.o. female accompanied by n/a Patient was referred by Dr Ilda Basset for symptoms of depression and anxiety. Patient reports the following symptoms/concerns: Pt states her primary symptom is being easily annoyed and overly irritable. Pt sleeps "a couple hours a night", experiences sleep paralysis,  nightmares, "seeing faces" upon waking,  fatigue and low motivation throughout the day, along with feeling depressed and anxious. Pt says she was treated for anxiety at 27yo, while living in a group home. Pt denies any history of SI or HI. Duration of problem: Since 12; Severity of problem: moderately severe  OBJECTIVE: Mood: Anxious and Affect: Appropriate Risk of harm to self or others: No plan to harm self or others  LIFE CONTEXT: Family and Social: Pt lives with her boyfriend. Pt's parents are  both treated for bipolar affective disorder. Bipolar and schizophrenia in extended family; pt is an only child, spent 3 years as a teen in a group home.  School/Work: - Self-Care: "nothing works" Life Changes: No recent life changes  GOALS ADDRESSED: Patient will: 1. Reduce symptoms of: agitation, anxiety, depression, insomnia, mood instability, stress and sleep paralysis 2. Increase knowledge and/or ability of: healthy habits and stress reduction  3. Demonstrate ability to: Increase adequate support systems for patient/family  INTERVENTIONS: Interventions utilized: Sleep Hygiene, Psychoeducation and/or Health Education and Link to [employer]  Standardized Assessments completed: GAD-7 and  PHQ 9  ASSESSMENT: Patient currently experiencing Mood disorder.   Patient may benefit from psychoeducation and brief therapeutic interventions regarding coping with symptoms of anxiety with panic attacks and depression related to mood disorder, along with referral to community mental health services .  PLAN: 1. Follow up with behavioral health clinician on : One month 2. Behavioral recommendations:  -Initial walk-in clinic appointment at either [place] or [place] prior to next medical appointment to establish care for [facility] med management and ongoing therapy -Keep track of [employer] consumption for one day; cut out one [employer] drink per day.  -Consider sleeping on side -Read educational materials regarding coping with symptoms of anxiety with panic attacks and depression -Consider [facility] Family-to-Family Classes for additional support 3. Referral(s): [place] (In Clinic) and [place] (LME/Outside Clinic) 4. "From scale of 1-10, how likely are you to follow plan?": 8  Garlan Fair, LCSW Depression screen [facility] 2/9 11/08/2017 09/18/2017 07/06/2017 04/13/2017 02/22/2017  Decreased Interest 2 2 0 3 0  Down, Depressed, Hopeless 2 2 0 3 0  PHQ - 2 Score 4 4 0 6 0  Altered sleeping 2 3 - 3 -  Tired, decreased energy 2 3 - 3 -  Change in appetite 2 2 - 2 -  Feeling bad or failure about yourself  1 0 - 0 -  Trouble concentrating 3 2 - 1 -  Moving slowly or fidgety/restless 2 2 - 2 -  Suicidal thoughts 0 0 - 0 -  PHQ-9 Score 16 16 - 17 -  Difficult doing work/chores - - - Very difficult -   GAD 7 : Generalized Anxiety Score 11/08/2017 09/18/2017  Nervous, Anxious, on Edge 2  1  Control/stop worrying 2 0  Worry too much - different things 2 1  Trouble relaxing 2 2  Restless 2 1  Easily annoyed or irritable 2 3  Afraid - awful might happen 2 0  Total GAD 7 Score 14 8

## 2017-11-08 NOTE — Progress Notes (Signed)
Pt reports coming today for a test of cure f/u ED visit on 10/30/17.  Reviewing pt's chart- pt was diagnosed and tx's on 10/30/17 which will be too soon for TOC.  Pt denies any other symptoms at this time.  Pt states "I just want to make sure the infection is gone".  Notified Dr. Ilda Basset of pt's c/o and if she could just do a self swab on a nurse visit.  Provider recommended for pt to come back in at least one week from today for nurse visit to do a self swab for TOC.  Pt agreed with provider's recommendation.  Pt also had elevated depression screen today. Pt agreed to see [place].

## 2017-11-19 ENCOUNTER — Ambulatory Visit (HOSPITAL_BASED_OUTPATIENT_CLINIC_OR_DEPARTMENT_OTHER): Payer: No Typology Code available for payment source | Admitting: Physical Medicine & Rehabilitation

## 2017-11-19 ENCOUNTER — Encounter: Payer: Self-pay | Admitting: Physical Medicine & Rehabilitation

## 2017-11-19 ENCOUNTER — Encounter: Payer: No Typology Code available for payment source | Attending: Physical Medicine & Rehabilitation

## 2017-11-19 VITALS — BP 102/73 | HR 92 | Resp 14 | Ht 64.0 in | Wt 209.0 lb

## 2017-11-19 DIAGNOSIS — G8929 Other chronic pain: Secondary | ICD-10-CM | POA: Insufficient documentation

## 2017-11-19 DIAGNOSIS — M4316 Spondylolisthesis, lumbar region: Secondary | ICD-10-CM | POA: Insufficient documentation

## 2017-11-19 DIAGNOSIS — M533 Sacrococcygeal disorders, not elsewhere classified: Secondary | ICD-10-CM

## 2017-11-19 DIAGNOSIS — M4310 Spondylolisthesis, site unspecified: Secondary | ICD-10-CM

## 2017-11-19 DIAGNOSIS — M545 Low back pain: Secondary | ICD-10-CM | POA: Insufficient documentation

## 2017-11-19 MED ORDER — CYCLOBENZAPRINE HCL 10 MG PO TABS: 10.0000 mg | ORAL_TABLET | Freq: Two times a day (BID) | ORAL | 0 refills | Status: DC | PRN

## 2017-11-19 MED ORDER — DIAZEPAM 10 MG PO TABS: 10.0000 mg | ORAL_TABLET | Freq: Four times a day (QID) | ORAL | 0 refills | Status: DC | PRN

## 2017-11-19 MED FILL — CYCLOBENZAPRINE 10 MG TAB: 10 | 10 days supply | Qty: 30 | Fill #0

## 2017-11-19 NOTE — Patient Instructions (Signed)
Take the valium 1/2 hr before the injection  You will need a driver  Rx for muscle relaxer

## 2017-11-19 NOTE — Progress Notes (Signed)
Subjective:    Patient ID: Wanda Crane, female    DOB: [birth year], 27 y.o.   MRN: [id number]  HPI Pt is complaining of increasing Right sided low back pain.  No radiation below the knee but has occ tingling in Right leg,  No weakness in the leg or falls.  Remains independent with all self care and mobility  Pt states pain interferes with sleep  Reviewed imaging with pt has L5-S1 spondylolisthesis No significant stenosis noted on imaging  Pain Inventory Average Pain 10 Pain Right Now 10 My pain is constant, sharp, burning, stabbing, tingling and aching  In the last 24 hours, has pain interfered with the following? General activity 9 Relation with others 9 Enjoyment of life 9 What TIME of day is your pain at its worst? all Sleep (in general) Poor  Pain is worse with: walking, bending, sitting, inactivity, standing and some activites Pain improves with: nothing Relief from Meds: 2  Mobility walk without assistance how many minutes can you walk? 3 ability to climb steps?  no do you drive?  yes  Function not employed: date last employed . I need assistance with the following:  meal prep, household duties and shopping  Neuro/Psych tingling spasms dizziness depression anxiety  Prior Studies Any changes since last visit?  no  Physicians involved in your care Any changes since last visit?  no   Family History  Problem Relation Age of Onset  . Asthma Mother   . COPD Mother   . Diabetes Maternal Grandmother   . Heart disease Maternal Grandmother   . Stroke Maternal Grandmother   . Diabetes Maternal Grandfather   . Heart disease Maternal Grandfather   . Stroke Maternal Grandfather    Social History   Socioeconomic History  . Marital status: Single    Spouse name: Not on file  . Number of children: Not on file  . Years of education: Not on file  . Highest education level: Not on file  Occupational History  . Not on file  Social Needs  . Financial  resource strain: Not on file  . Food insecurity:    Worry: Not on file    Inability: Not on file  . Transportation needs:    Medical: Not on file    Non-medical: Not on file  Tobacco Use  . Smoking status: Never Smoker  . Smokeless tobacco: Never Used  Substance and Sexual Activity  . Alcohol use: No  . Drug use: No  . Sexual activity: Yes    Birth control/protection: None  Lifestyle  . Physical activity:    Days per week: Not on file    Minutes per session: Not on file  . Stress: Not on file  Relationships  . Social connections:    Talks on phone: Not on file    Gets together: Not on file    Attends religious service: Not on file    Active member of club or organization: Not on file    Attends meetings of clubs or organizations: Not on file    Relationship status: Not on file  Other Topics Concern  . Not on file  Social History Narrative  . Not on file   History reviewed. No pertinent surgical history. Past Medical History:  Diagnosis Date  . Chronic right-sided low back pain with right-sided sciatica 04/13/2017  . Hemorrhagic cyst of ovary   . Spondylolisthesis, grade 1 11/27/2016   BP 102/73 (BP Location: Right Arm, Patient Position: Sitting, Cuff Size:  Large)   Pulse 92   Resp 14   Ht 5\' 4"  (1.626 m)   Wt 209 lb (94.8 kg)   SpO2 97%   BMI 35.87 kg/m   Opioid Risk Score:   Fall Risk Score:  `1  Depression screen PHQ 2/9  Depression screen [facility] 2/9 11/08/2017 09/18/2017 07/06/2017 04/13/2017 02/22/2017 12/27/2016 11/27/2016  Decreased Interest 2 2 0 3 0 0 0  Down, Depressed, Hopeless 2 2 0 3 0 0 0  PHQ - 2 Score 4 4 0 6 0 0 0  Altered sleeping 2 3 - 3 - - -  Tired, decreased energy 2 3 - 3 - - -  Change in appetite 2 2 - 2 - - -  Feeling bad or failure about yourself  1 0 - 0 - - -  Trouble concentrating 3 2 - 1 - - -  Moving slowly or fidgety/restless 2 2 - 2 - - -  Suicidal thoughts 0 0 - 0 - - -  PHQ-9 Score 16 16 - 17 - - -  Difficult doing work/chores - -  - Very difficult - - -    Review of Systems  Constitutional: Positive for diaphoresis and unexpected weight change.  Respiratory: Positive for shortness of breath.   Gastrointestinal: Positive for abdominal pain and nausea.  Musculoskeletal: Positive for back pain.       Spasms   Neurological: Positive for dizziness.       Tingling  Psychiatric/Behavioral: Positive for dysphoric mood. The patient is nervous/anxious.        Objective:   Physical Exam  Constitutional: She is oriented to person, place, and time. She appears well-developed and well-nourished.  HENT:  Head: Normocephalic and atraumatic.  Eyes: Pupils are equal, round, and reactive to light. Conjunctivae are normal.  Neck: Normal range of motion.  Musculoskeletal:  Pain to palpation bilateral lumbar paraspinals pain with extension greater than with flexion  Neurological: She is alert and oriented to person, place, and time. No sensory deficit.  Motor strength is 5/5 bilateral hip flexor knee extensor ankle dorsiflexor Negative straight leg raising Gait without evidence of toe drag or knee instability no assistive device required Sensation intact bilateral lower extremities  Skin: Skin is warm and dry.  Psychiatric: She has a normal mood and affect. Her behavior is normal. Judgment and thought content normal.  Nursing note and vitals reviewed.         Assessment & Plan:  1.  Lumbar pain axial no significant radicular component identified exam shows no focal neurologic deficits.  No significant improvement with conservative care. Recommend medial branch blocks L3-L4 as well as L5 dorsal ramus injection under fluoroscopic guidance we will schedule.  Occult he with sleep secondary to low back pain will trial cyclobenzaprine  Continue tramadol 50 mg twice daily  Continue opioid monitoring program. This consists of regular clinic visits, examinations, urine drug screen, pill counts as well as use of [place]  controlled substance reporting System.  PMP aware checked today, urine drug screen 11/19/2017

## 2017-11-20 ENCOUNTER — Telehealth: Payer: Self-pay

## 2017-11-20 NOTE — Telephone Encounter (Signed)
[facility] called stating that Diazepam 10 mg one every 6 hours prn aniexty qty #1. Calling to verify the qty

## 2017-11-21 NOTE — Telephone Encounter (Signed)
Called pharmacy back, gave them new sig instructions for medication which reflected last note:  Take the valium 1/2 hr before the injection  In addition to having the pharmacy place medication on hold until the 7th of may as the patients procedure Is on the 10th of may.

## 2017-11-22 ENCOUNTER — Telehealth: Payer: Self-pay | Admitting: Physical Medicine & Rehabilitation

## 2017-11-22 ENCOUNTER — Other Ambulatory Visit: Payer: Self-pay | Admitting: Physical Medicine & Rehabilitation

## 2017-11-22 ENCOUNTER — Other Ambulatory Visit: Payer: Self-pay

## 2017-11-22 DIAGNOSIS — M4310 Spondylolisthesis, site unspecified: Secondary | ICD-10-CM

## 2017-11-22 MED ORDER — TIZANIDINE HCL 2 MG PO CAPS: 2.0000 mg | ORAL_CAPSULE | Freq: Three times a day (TID) | ORAL | 0 refills | Status: DC

## 2017-11-22 NOTE — Telephone Encounter (Signed)
Pt stated when she started the Rx for Flexeril T-3 she began to have cramps in both legs. Pt stated she has stopped the Rx as of T-2. Please advise.

## 2017-11-22 NOTE — Telephone Encounter (Signed)
Ordered per providers instructions

## 2017-11-22 NOTE — Addendum Note (Signed)
Addended by: Marland Mcalpine B on: 11/22/2017 04:11 PM   Modules accepted: Orders

## 2017-11-22 NOTE — Telephone Encounter (Signed)
I am not sure what T3 and T2 mean.  I have never had patient's complaint of leg cramps with Flexeril.  She could stop this she wishes.  We can try another muscle relaxer such as tizanidine 2 mg 3 times daily as needed #45 no refills

## 2017-11-23 LAB — TOXASSURE SELECT 13 (MW), URINE

## 2017-11-27 NOTE — Progress Notes (Signed)
I have reviewed the chart and agree with nursing staff's documentation of this patient's encounter.  Aletha Halim, MD 11/27/2017 8:35 AM

## 2017-11-28 ENCOUNTER — Telehealth: Payer: Self-pay | Admitting: *Deleted

## 2017-11-28 NOTE — Telephone Encounter (Signed)
Urine drug screen for this encounter is consistent for prescribed medication 

## 2017-12-06 ENCOUNTER — Ambulatory Visit (INDEPENDENT_AMBULATORY_CARE_PROVIDER_SITE_OTHER): Payer: Self-pay | Admitting: Physician Assistant

## 2017-12-07 MED FILL — traMADol HCL 50 MG TABS: 50 | 30 days supply | Qty: 60 | Fill #0

## 2017-12-12 ENCOUNTER — Encounter: Payer: Self-pay | Admitting: Obstetrics and Gynecology

## 2017-12-12 ENCOUNTER — Ambulatory Visit: Payer: Self-pay

## 2017-12-12 ENCOUNTER — Ambulatory Visit (INDEPENDENT_AMBULATORY_CARE_PROVIDER_SITE_OTHER): Payer: Self-pay

## 2017-12-12 ENCOUNTER — Ambulatory Visit: Payer: Self-pay | Admitting: Clinical

## 2017-12-12 VITALS — BP 103/65 | HR 84 | Ht 63.0 in | Wt 211.2 lb

## 2017-12-12 DIAGNOSIS — A599 Trichomoniasis, unspecified: Secondary | ICD-10-CM

## 2017-12-12 DIAGNOSIS — F39 Unspecified mood [affective] disorder: Secondary | ICD-10-CM

## 2017-12-12 DIAGNOSIS — Z113 Encounter for screening for infections with a predominantly sexual mode of transmission: Secondary | ICD-10-CM

## 2017-12-12 NOTE — BH Specialist Note (Signed)
Integrated Behavioral Health Follow Up Visit  MRN: [id number] Name: Wanda Crane  Number of [facility] Clinician visits: 2/6 Session Start time: 3:25 Session End time: 3:36 Total time: 15 minutes  Type of Service: [place] Interpretor:No. Interpretor Name and Language: n/a  SUBJECTIVE: Elleigh Cassetta is a 27 y.o. female accompanied by n/a Patient was referred by Dr Ilda Basset for depression and anxiety. Patient reports the following symptoms/concerns: Pt states she is becoming more irritable and her sleep paralysis has remained the same, she has cut back on her [facility] Dew/caffeine with no change to her sleep habits, and says she is ready to set up an appointment with [facility] the [place]. Pt agrees to call today.  Duration of problem: Since age 68; Severity of problem: moderately severe  OBJECTIVE: Mood: Normal and Affect: Appropriate Risk of harm to self or others: No plan to harm self or others  LIFE CONTEXT: Family and Social: Pt lives with her boyfriend, her parents are being treated for bipolar disorder. Family hx of both bipolar and schizophrenia. Pt is an only child; spent 3 years in her teens in a group home. School/Work: Unemployed Self-Care: Recognizing that medication may be best for self-care Life Changes: No recent life changes  GOALS ADDRESSED: Patient will: 1.  Reduce symptoms of: agitation, anxiety, depression, insomnia, mood instability, stress and sleep paralysis  2.  Increase knowledge and/or ability of: self-management skills  3.  Demonstrate ability to: Increase adequate support systems for patient/family  INTERVENTIONS: Interventions utilized:  Motivational Interviewing Standardized Assessments completed: Patient declined screening  ASSESSMENT: Patient currently experiencing Mood disorder.    Patient may benefit from brief therapeutic intervention today and  further assessment and BH med  management via community mental health services  PLAN: 1. Follow up with behavioral health clinician on : As requested by pt 2. Behavioral recommendations:  -Call [facility] today, to set up initial appointment 3. Referral(s): [place] (In Clinic) and [place] (LME/Outside Clinic) 4. "From scale of 1-10, how likely are you to follow plan?": 9  Garlan Fair, LCSW  Depression screen [facility] 2/9 11/08/2017 09/18/2017 07/06/2017 04/13/2017 02/22/2017  Decreased Interest 2 2 0 3 0  Down, Depressed, Hopeless 2 2 0 3 0  PHQ - 2 Score 4 4 0 6 0  Altered sleeping 2 3 - 3 -  Tired, decreased energy 2 3 - 3 -  Change in appetite 2 2 - 2 -  Feeling bad or failure about yourself  1 0 - 0 -  Trouble concentrating 3 2 - 1 -  Moving slowly or fidgety/restless 2 2 - 2 -  Suicidal thoughts 0 0 - 0 -  PHQ-9 Score 16 16 - 17 -  Difficult doing work/chores - - - Very difficult -   GAD 7 : Generalized Anxiety Score 11/08/2017 09/18/2017  Nervous, Anxious, on Edge 2 1  Control/stop worrying 2 0  Worry too much - different things 2 1  Trouble relaxing 2 2  Restless 2 1  Easily annoyed or irritable 2 3  Afraid - awful might happen 2 0  Total GAD 7 Score 14 8

## 2017-12-12 NOTE — Progress Notes (Signed)
Pt came in for [facility], had pt to give urine.Advised results will be on My Chart. Pt verbalized understanding.

## 2017-12-13 LAB — CERVICOVAGINAL ANCILLARY ONLY: TRICH (WINDOWPATH): NEGATIVE

## 2017-12-13 NOTE — Progress Notes (Signed)
I have reviewed the chart and agree with nursing staff's documentation of this patient's encounter.  Aletha Halim, MD 12/13/2017 10:14 AM

## 2017-12-14 ENCOUNTER — Ambulatory Visit: Payer: No Typology Code available for payment source | Admitting: Physical Medicine & Rehabilitation

## 2018-01-04 ENCOUNTER — Ambulatory Visit: Payer: No Typology Code available for payment source | Admitting: Physical Medicine & Rehabilitation

## 2018-01-07 MED FILL — traMADol HCL 50 MG TABS: 50 | 30 days supply | Qty: 60 | Fill #1

## 2018-01-18 ENCOUNTER — Encounter (INDEPENDENT_AMBULATORY_CARE_PROVIDER_SITE_OTHER): Payer: Self-pay | Admitting: Physician Assistant

## 2018-01-18 ENCOUNTER — Ambulatory Visit (INDEPENDENT_AMBULATORY_CARE_PROVIDER_SITE_OTHER): Payer: Self-pay | Admitting: Physician Assistant

## 2018-01-18 ENCOUNTER — Other Ambulatory Visit: Payer: Self-pay

## 2018-01-18 VITALS — BP 94/68 | HR 76 | Temp 98.2°F | Ht 63.0 in | Wt 209.6 lb

## 2018-01-18 DIAGNOSIS — G8929 Other chronic pain: Secondary | ICD-10-CM

## 2018-01-18 DIAGNOSIS — K011 Impacted teeth: Secondary | ICD-10-CM

## 2018-01-18 DIAGNOSIS — K089 Disorder of teeth and supporting structures, unspecified: Secondary | ICD-10-CM

## 2018-01-18 MED ORDER — ACETAMINOPHEN-CODEINE #3 300-30 MG PO TABS: 1.0000 | ORAL_TABLET | Freq: Two times a day (BID) | ORAL | 0 refills | Status: DC | PRN

## 2018-01-18 NOTE — Progress Notes (Signed)
Subjective:  Patient ID: Wanda Crane, female    DOB: 1991/03/26  Age: 27 y.o. MRN: [id number]  CC: dental referral   HPI Wanda Crane a 27 y.o.femalewithwith grade 1 spondylolisthesis of L5-S1 presents with dental pain. Has been having pain due to wisdom tooth impaction. She is unemployed and can not afford to see a dentist. Pain aggravated with chewing. There is no current swelling, suppuration, bleeding, sinus pain, neck pain, or f/c/n/v. No other symptoms besides chronic back pain that is treated at [facility] with Dr. Letta Pate.        Outpatient Medications Prior to Visit  Medication Sig Dispense Refill  . gabapentin (NEURONTIN) 300 MG capsule Take 3 capsules (900 mg total) by mouth 3 (three) times daily. 270 capsule 3  . traMADol (ULTRAM) 50 MG tablet TAKE 1 TABLET BY MOUTH 2 TIMES DAILY. 60 tablet 3  . diazepam (VALIUM) 10 MG tablet Take 1 tablet (10 mg total) by mouth every 6 (six) hours as needed for anxiety. (Patient not taking: Reported on 12/12/2017) 1 tablet 0  . tizanidine (ZANAFLEX) 2 MG capsule Take 1 capsule (2 mg total) by mouth 3 (three) times daily. (Patient not taking: Reported on 12/12/2017) 45 capsule 0   No facility-administered medications prior to visit.      ROS Review of Systems  Constitutional: Negative for chills, fever and malaise/fatigue.  Eyes: Negative for blurred vision.  Respiratory: Negative for shortness of breath.   Cardiovascular: Negative for chest pain and palpitations.  Gastrointestinal: Negative for abdominal pain and nausea.  Genitourinary: Negative for dysuria and hematuria.  Musculoskeletal: Negative for joint pain and myalgias.  Skin: Negative for rash.  Neurological: Negative for tingling and headaches.  Psychiatric/Behavioral: Negative for depression. The patient is not nervous/anxious.     Objective:  BP 94/68 (BP Location: Left Arm, Patient Position: Sitting, Cuff Size: Large)   Pulse 76   Temp 98.2 F (36.8  C) (Oral)   Ht 5\' 3"  (1.6 m)   Wt 209 lb 9.6 oz (95.1 kg)   LMP 12/14/2017 (Approximate)   SpO2 98%   BMI 37.13 kg/m   BP/Weight 01/18/2018 12/12/2017 1/76/1607  Systolic BP 94 371 062  Diastolic BP 68 65 73  Wt. (Lbs) 209.6 211.2 209  BMI 37.13 37.41 35.87      Physical Exam  Constitutional: She is oriented to person, place, and time.  Well developed, well nourished, NAD, polite  HENT:  Head: Normocephalic and atraumatic.  Mouth/Throat: No oropharyngeal exudate.  No swelling of the tongue, oropharynx, or face. Tooth number one is erupted and seems impacted; no erythema or edema of the surround gingiva.  Eyes: No scleral icterus.  Neck: Normal range of motion. Neck supple. No tracheal deviation present. No thyromegaly present.  Pulmonary/Chest: Effort normal.  Musculoskeletal: She exhibits no edema.  Lymphadenopathy:    She has no cervical adenopathy.  Neurological: She is alert and oriented to person, place, and time.  Skin: Skin is warm and dry. No rash noted. No erythema. No pallor.  Psychiatric: She has a normal mood and affect. Her behavior is normal. Thought content normal.  Vitals reviewed.    Assessment & Plan:    1. Chronic dental pain - Continue on Tramadol provided by Dr. Letta Pate from pain clinic for back pain. - I have given patient information to contact [facility] [employer]   2. Tooth impaction - Continue on Tramadol provided by Dr. Letta Pate from pain clinic for back pain. - I  have given patient information to contact [facility] [employer]     Meds ordered this encounter  Medications  . acetaminophen-codeine (TYLENOL #3) 300-30 MG tablet    Sig: Take 1 tablet by mouth every 12 (twelve) hours as needed for up to 7 days for moderate pain.    Dispense:  30 tablet    Refill:  0    Order Specific Question:   Supervising Provider    Answer:   Charlott Rakes [[id number]]    Follow-up: Return if symptoms worsen or fail to improve.    Clent Demark PA

## 2018-01-18 NOTE — Patient Instructions (Signed)
Dental Pain Dental pain may be caused by many things, including:  Tooth decay (cavities or caries). Cavities cause the nerve of your tooth to be open to air and hot or cold temperatures. This can cause pain or discomfort.  Abscess or infection. A dental abscess is an area that is full of infected pus from a bacterial infection in the inner part of the tooth (pulp). It usually happens at the end of the tooth's root.  Injury.  An unknown reason (idiopathic).  Your pain may be mild or severe. It may only happen when:  You are chewing.  You are exposed to hot or cold temperature.  You are eating or drinking sugary foods or beverages, such as: ? Soda. ? Candy.  Your pain may also be there all of the time. Follow these instructions at home: Watch your dental pain for any changes. Do these things to lessen your discomfort:  Take medicines only as told by your dentist.  If your dentist tells you to take an antibiotic medicine, finish all of it even if you start to feel better.  Keep all follow-up visits as told by your dentist. This is important.  Do not apply heat to the outside of your face.  Rinse your mouth or gargle with salt water if told by your dentist. This helps with pain and swelling. ? You can make salt water by adding  tsp of salt to 1 cup of warm water.  Apply ice to the painful area of your face: ? Put ice in a plastic bag. ? Place a towel between your skin and the bag. ? Leave the ice on for 20 minutes, 2-3 times per day.  Avoid foods or drinks that cause you pain, such as: ? Very hot or very cold foods or drinks. ? Sweet or sugary foods or drinks.  Contact a doctor if:  Your pain is not helped with medicines.  Your symptoms are worse.  You have new symptoms. Get help right away if:  You cannot open your mouth.  You are having trouble breathing or swallowing.  You have a fever.  Your face, neck, or jaw is puffy (swollen). This information is not  intended to replace advice given to you by your health care provider. Make sure you discuss any questions you have with your health care provider. Document Released: 01/10/2008 Document Revised: 12/30/2015 Document Reviewed: 07/20/2014 Elsevier Interactive Patient Education  2018 [employer]vier Inc.  

## 2018-02-05 MED FILL — traMADol HCL 50 MG TABS: 50 | 30 days supply | Qty: 60 | Fill #2

## 2018-02-18 ENCOUNTER — Ambulatory Visit: Payer: No Typology Code available for payment source | Attending: Physician Assistant

## 2018-02-18 ENCOUNTER — Other Ambulatory Visit (INDEPENDENT_AMBULATORY_CARE_PROVIDER_SITE_OTHER): Payer: Self-pay | Admitting: Physician Assistant

## 2018-02-18 DIAGNOSIS — M4310 Spondylolisthesis, site unspecified: Secondary | ICD-10-CM

## 2018-02-18 NOTE — Telephone Encounter (Signed)
FWD to PCP. Tempestt S Roberts, CMA  

## 2018-02-20 ENCOUNTER — Encounter (HOSPITAL_COMMUNITY): Payer: Self-pay

## 2018-02-20 ENCOUNTER — Other Ambulatory Visit: Payer: Self-pay

## 2018-02-20 ENCOUNTER — Emergency Department (HOSPITAL_COMMUNITY)
Admission: EM | Admit: 2018-02-20 | Discharge: 2018-02-21 | Disposition: A | Discharge status: Home / Self Care | Payer: No Typology Code available for payment source | Attending: Emergency Medicine | Admitting: Emergency Medicine

## 2018-02-20 DIAGNOSIS — Z79899 Other long term (current) drug therapy: Secondary | ICD-10-CM | POA: Insufficient documentation

## 2018-02-20 DIAGNOSIS — T5991XA Toxic effect of unspecified gases, fumes and vapors, accidental (unintentional), initial encounter: Secondary | ICD-10-CM

## 2018-02-20 DIAGNOSIS — T59891A Toxic effect of other specified gases, fumes and vapors, accidental (unintentional), initial encounter: Secondary | ICD-10-CM | POA: Insufficient documentation

## 2018-02-20 LAB — CBC WITH DIFFERENTIAL/PLATELET
Abs Immature Granulocytes: 0 10*3/uL (ref 0.0–0.1)
BASOS ABS: 0 10*3/uL (ref 0.0–0.1)
Basophils Relative: 0 %
EOS ABS: 0.1 10*3/uL (ref 0.0–0.7)
EOS PCT: 1 %
HCT: 39.8 % (ref 36.0–46.0)
Hemoglobin: 11.9 g/dL — ABNORMAL LOW (ref 12.0–15.0)
IMMATURE GRANULOCYTES: 0 %
Lymphocytes Relative: 22 %
Lymphs Abs: 2.6 10*3/uL (ref 0.7–4.0)
MCH: 25.1 pg — ABNORMAL LOW (ref 26.0–34.0)
MCHC: 29.9 g/dL — AB (ref 30.0–36.0)
MCV: 83.8 fL (ref 78.0–100.0)
Monocytes Absolute: 1.1 10*3/uL — ABNORMAL HIGH (ref 0.1–1.0)
Monocytes Relative: 9 %
NEUTROS PCT: 68 %
Neutro Abs: 8.1 10*3/uL — ABNORMAL HIGH (ref 1.7–7.7)
Platelets: 333 10*3/uL (ref 150–400)
RBC: 4.75 MIL/uL (ref 3.87–5.11)
RDW: 15.5 % (ref 11.5–15.5)
WBC: 11.9 10*3/uL — ABNORMAL HIGH (ref 4.0–10.5)

## 2018-02-20 LAB — COMPREHENSIVE METABOLIC PANEL
ALBUMIN: 3.6 g/dL (ref 3.5–5.0)
ALK PHOS: 74 U/L (ref 38–126)
ALT: 35 U/L (ref 0–44)
AST: 28 U/L (ref 15–41)
Anion gap: 7 (ref 5–15)
BILIRUBIN TOTAL: 0.4 mg/dL (ref 0.3–1.2)
BUN: 5 mg/dL — AB (ref 6–20)
CALCIUM: 9.4 mg/dL (ref 8.9–10.3)
CO2: 25 mmol/L (ref 22–32)
CREATININE: 0.86 mg/dL (ref 0.44–1.00)
Chloride: 106 mmol/L (ref 98–111)
GFR calc Af Amer: >60 mL/min (ref >60)
GLUCOSE: 95 mg/dL (ref 70–99)
POTASSIUM: 4.2 mmol/L (ref 3.5–5.1)
Sodium: 138 mmol/L (ref 135–145)
TOTAL PROTEIN: 7.1 g/dL (ref 6.5–8.1)

## 2018-02-20 LAB — COOXEMETRY PANEL
Carboxyhemoglobin: 0.5 % (ref 0.5–1.5)
METHEMOGLOBIN: 1.5 % (ref 0.0–1.5)
O2 SAT: 30.5 %
TOTAL HEMOGLOBIN: 12.1 g/dL (ref 12.0–16.0)

## 2018-02-20 LAB — I-STAT BETA HCG BLOOD, ED (MC, WL, AP ONLY): I-stat hCG, quantitative: <5 m[IU]/mL (ref <5)

## 2018-02-20 LAB — LIPASE, BLOOD: LIPASE: 29 U/L (ref 11–51)

## 2018-02-20 NOTE — ED Triage Notes (Signed)
Pt states that for the past 3 days there has been a gas leak in her home, having dizziness, headache, n/v, SOB

## 2018-02-20 NOTE — ED Provider Notes (Signed)
Patient placed in Quick Look pathway, seen and evaluated   Chief Complaint: possible exposure to chemical gas   HPI:   Dizziness, headache, nausea, vomiting, diarrhea x 3 days.  Haz mat team was in apartment complex, found out there was a "gas leak" in 3 apartment buildings.  Boyfriend's dog also vomited. Boyfriend and neighbor with similar symptoms.   ROS:  No fever  Physical Exam:   Gen: No distress  Neuro: Awake and Alert  Skin: Warm    Focused Exam: MMM. RRR. Lungs CTAB    Initiation of care has begun. The patient has been counseled on the process, plan, and necessity for staying for the completion/evaluation, and the remainder of the medical screening examination    Arlean Hopping 02/20/18 1954    Hayden Rasmussen, MD 02/22/18 [phone]

## 2018-02-20 NOTE — ED Notes (Signed)
Results reviewed.  No changes in acuity at this time 

## 2018-02-21 MED ORDER — ONDANSETRON 4 MG PO TBDP: 4.0000 mg | ORAL_TABLET | Freq: Three times a day (TID) | ORAL | 0 refills | Status: DC | PRN

## 2018-02-21 NOTE — Discharge Instructions (Signed)
Try to keep yourself removed from areas where fumes may be contained.  Open all of the windows in your home to promote airflow.  Follow-up with your primary care doctor.  Return for new or concerning symptoms. °

## 2018-02-21 NOTE — ED Provider Notes (Signed)
[facility] EMERGENCY DEPARTMENT Provider Note   CSN: [id number] Arrival date & time: 02/20/18  1849     History   Chief Complaint Chief Complaint  Patient presents with  . Toxic Inhalation    HPI Wanda Crane is a 27 y.o. female.  27 year old female with no significant past medical history presents to the emergency department for a myriad of symptoms.  She reports intermittent dizziness, headaches, nausea, vomiting, diarrhea x3 days.  Patient reports that there was recently a hazmat team in the apartment complex that found a gas leak in 3 apartment buildings.  She notices that her symptoms subside when she is no longer within the home.  Her boyfriend and dog have also been experiencing similar symptoms.  She thought that her symptoms may also be due to a viral illness because her mother was experiencing vomiting recently as well.  She has had no fevers.  No complaints of chest pain or shortness of breath.     Past Medical History:  Diagnosis Date  . Chronic right-sided low back pain with right-sided sciatica 04/13/2017  . Hemorrhagic cyst of ovary   . Spondylolisthesis, grade 1 11/27/2016    Patient Active Problem List   Diagnosis Date Noted  . Chronic right-sided low back pain with right-sided sciatica 04/13/2017  . Spondylolisthesis, grade 1 11/27/2016    History reviewed. No pertinent surgical history.   OB History    Gravida  0   Para  0   Term  0   Preterm  0   AB  0   Living  0     SAB  0   TAB  0   Ectopic  0   Multiple  0   Live Births  0            Home Medications    Prior to Admission medications   Medication Sig Start Date End Date Taking? Authorizing Provider  gabapentin (NEURONTIN) 300 MG capsule Take 3 capsules (900 mg total) by mouth 3 (three) times daily. 09/18/17   Clent Demark, PA-C  ondansetron (ZOFRAN ODT) 4 MG disintegrating tablet Take 1 tablet (4 mg total) by mouth every 8 (eight) hours as needed  for nausea or vomiting. 02/21/18   Antonietta Breach, PA-C  traMADol (ULTRAM) 50 MG tablet TAKE 1 TABLET BY MOUTH 2 TIMES DAILY. 11/26/17   Kirsteins, Luanna Salk, MD    Family History Family History  Problem Relation Age of Onset  . Asthma Mother   . COPD Mother   . Diabetes Maternal Grandmother   . Heart disease Maternal Grandmother   . Stroke Maternal Grandmother   . Diabetes Maternal Grandfather   . Heart disease Maternal Grandfather   . Stroke Maternal Grandfather     Social History Social History   Tobacco Use  . Smoking status: Never Smoker  . Smokeless tobacco: Never Used  Substance Use Topics  . Alcohol use: No  . Drug use: No     Allergies   Patient has no known allergies.   Review of Systems Review of Systems Ten systems reviewed and are negative for acute change, except as noted in the HPI.    Physical Exam Updated Vital Signs BP 117/79 (BP Location: Right Arm)   Pulse (!) 107   Temp 99.5 F (37.5 C) (Oral)   Resp 20   LMP 01/31/2018   SpO2 100%   Physical Exam  Constitutional: She is oriented to person, place, and time. She appears  well-developed and well-nourished. No distress.  Nontoxic appearing and in NAD  HENT:  Head: Normocephalic and atraumatic.  Eyes: Conjunctivae and EOM are normal. No scleral icterus.  Neck: Normal range of motion.  Pulmonary/Chest: Effort normal. No stridor. No respiratory distress. She has no wheezes. She has no rales.  Lungs CTAB. Respirations even and unlabored  Musculoskeletal: Normal range of motion.  Neurological: She is alert and oriented to person, place, and time. She exhibits normal muscle tone. Coordination normal.  GCS 15. Moving all extremities spontaneously.  Skin: Skin is warm and dry. No rash noted. She is not diaphoretic. No erythema. No pallor.  Psychiatric: She has a normal mood and affect. Her behavior is normal.  Nursing note and vitals reviewed.    ED Treatments / Results  Labs (all labs ordered  are listed, but only abnormal results are displayed) Labs Reviewed  CBC WITH DIFFERENTIAL/PLATELET - Abnormal; Notable for the following components:      Result Value   WBC 11.9 (*)    Hemoglobin 11.9 (*)    MCH 25.1 (*)    MCHC 29.9 (*)    Neutro Abs 8.1 (*)    Monocytes Absolute 1.1 (*)    All other components within normal limits  COMPREHENSIVE METABOLIC PANEL - Abnormal; Notable for the following components:   BUN 5 (*)    All other components within normal limits  COOXEMETRY PANEL  LIPASE, BLOOD  I-STAT BETA HCG BLOOD, ED ([facility], [facility], [facility] ONLY)    EKG None  Radiology No results found.  Procedures Procedures (including critical care time)  Medications Ordered in ED Medications - No data to display   Initial Impression / Assessment and Plan / ED Course  I have reviewed the triage vital signs and the nursing notes.  Pertinent labs & imaging results that were available during my care of the patient were reviewed by me and considered in my medical decision making (see chart for details).     27 year old female presents for headache, dizziness, nausea, vomiting, diarrhea.  Symptoms present x3 days and wax and wane whether she is inside or outside her home.  History of recent gas leak in 3 apartment buildings in her complex.  Boyfriend and dog have also been ill with similar complaints.  Patient is asymptomatic at this time.  Her laboratory work-up is reassuring and consistent with baseline.  Have discussed need for consistent ventilation.  I do not believe further emergent work-up is indicated at this time.  Return precautions discussed and provided. Patient discharged in stable condition with no unaddressed concerns.   Final Clinical Impressions(s) / ED Diagnoses   Final diagnoses:  Toxic effect of unspecified gases, fumes and vapors, accidental (unintentional), initial encounter    ED Discharge Orders        Ordered    ondansetron (ZOFRAN ODT) 4 MG disintegrating tablet   Every 8 hours PRN     02/21/18 0141       Antonietta Breach, PA-C 02/21/18 0146    Fatima Blank, MD 02/21/18 2053

## 2018-02-28 ENCOUNTER — Encounter (HOSPITAL_COMMUNITY): Payer: Self-pay | Admitting: Emergency Medicine

## 2018-02-28 ENCOUNTER — Ambulatory Visit (HOSPITAL_COMMUNITY)
Admission: EM | Admit: 2018-02-28 | Discharge: 2018-02-28 | Disposition: A | Discharge status: Home / Self Care | Payer: Self-pay | Attending: Family Medicine | Admitting: Family Medicine

## 2018-02-28 DIAGNOSIS — R519 Headache, unspecified: Secondary | ICD-10-CM

## 2018-02-28 DIAGNOSIS — R51 Headache: Secondary | ICD-10-CM

## 2018-02-28 MED ORDER — KETOROLAC TROMETHAMINE 60 MG/2ML IM SOLN
INTRAMUSCULAR | Status: AC
  Filled 2018-02-28: qty 2

## 2018-02-28 MED ORDER — DEXAMETHASONE SODIUM PHOSPHATE 10 MG/ML IJ SOLN
10.0000 mg | Freq: Once | INTRAMUSCULAR | Status: AC
  Administered 2018-02-28: 10 mg via INTRAMUSCULAR

## 2018-02-28 MED ORDER — KETOROLAC TROMETHAMINE 60 MG/2ML IM SOLN
60.0000 mg | Freq: Once | INTRAMUSCULAR | Status: AC
  Administered 2018-02-28: 60 mg via INTRAMUSCULAR

## 2018-02-28 MED ORDER — METOCLOPRAMIDE HCL 5 MG/ML IJ SOLN
INTRAMUSCULAR | Status: AC
  Filled 2018-02-28: qty 2

## 2018-02-28 MED ORDER — PREDNISONE 50 MG PO TABS: 50.0000 mg | ORAL_TABLET | Freq: Every day | ORAL | 0 refills | Status: AC

## 2018-02-28 MED ORDER — METOCLOPRAMIDE HCL 5 MG/ML IJ SOLN
5.0000 mg | Freq: Once | INTRAMUSCULAR | Status: AC
  Administered 2018-02-28: 18:00:00 via INTRAMUSCULAR

## 2018-02-28 MED ORDER — DEXAMETHASONE SODIUM PHOSPHATE 10 MG/ML IJ SOLN
INTRAMUSCULAR | Status: AC
  Filled 2018-02-28: qty 1

## 2018-02-28 MED ORDER — FLUTICASONE PROPIONATE 50 MCG/ACT NA SUSP: 2.0000 | Freq: Every day | NASAL | 0 refills | Status: DC

## 2018-02-28 MED ORDER — MELOXICAM 7.5 MG PO TABS: 7.5000 mg | ORAL_TABLET | Freq: Every day | ORAL | 0 refills | Status: DC

## 2018-02-28 NOTE — ED Provider Notes (Signed)
[facility]    CSN: [id number] Arrival date & time: 02/28/18  1619     History   Chief Complaint Chief Complaint  Patient presents with  . Headache    HPI Wanda Crane is a 27 y.o. female.   27 year old female comes in for 2-week history of headache.  States headache came on gradually, left-sided, throbbing, constant.  At first stated no aggravating factor, then stated laying down makes pain worse.  Denies photophobia, phonophobia.  Denies vision changes.  Denies nausea, vomiting.  Has felt lightheaded without weakness, dizziness, syncope.  Denies fever, chills, night sweats.  Denies URI symptoms such as cough, congestion, sore throat.  Has tried Tylenol, naproxen, tramadol without relief.     Past Medical History:  Diagnosis Date  . Chronic right-sided low back pain with right-sided sciatica 04/13/2017  . Hemorrhagic cyst of ovary   . Spondylolisthesis, grade 1 11/27/2016    Patient Active Problem List   Diagnosis Date Noted  . Chronic right-sided low back pain with right-sided sciatica 04/13/2017  . Spondylolisthesis, grade 1 11/27/2016    History reviewed. No pertinent surgical history.  OB History    Gravida  0   Para  0   Term  0   Preterm  0   AB  0   Living  0     SAB  0   TAB  0   Ectopic  0   Multiple  0   Live Births  0            Home Medications    Prior to Admission medications   Medication Sig Start Date End Date Taking? Authorizing Provider  gabapentin (NEURONTIN) 300 MG capsule Take 3 capsules (900 mg total) by mouth 3 (three) times daily. 09/18/17  Yes Clent Demark, PA-C  traMADol (ULTRAM) 50 MG tablet TAKE 1 TABLET BY MOUTH 2 TIMES DAILY. 11/26/17  Yes Kirsteins, Luanna Salk, MD  fluticasone (FLONASE) 50 MCG/ACT nasal spray Place 2 sprays into both nostrils daily. 02/28/18   Tasia Catchings, Amy V, PA-C  meloxicam (MOBIC) 7.5 MG tablet Take 1 tablet (7.5 mg total) by mouth daily. 02/28/18   Tasia Catchings, Amy V, PA-C  ondansetron (ZOFRAN  ODT) 4 MG disintegrating tablet Take 1 tablet (4 mg total) by mouth every 8 (eight) hours as needed for nausea or vomiting. 02/21/18   Antonietta Breach, PA-C  predniSONE (DELTASONE) 50 MG tablet Take 1 tablet (50 mg total) by mouth daily for 5 days. 02/28/18 03/05/18  Ok Edwards, PA-C    Family History Family History  Problem Relation Age of Onset  . Asthma Mother   . COPD Mother   . Diabetes Maternal Grandmother   . Heart disease Maternal Grandmother   . Stroke Maternal Grandmother   . Diabetes Maternal Grandfather   . Heart disease Maternal Grandfather   . Stroke Maternal Grandfather     Social History Social History   Tobacco Use  . Smoking status: Never Smoker  . Smokeless tobacco: Never Used  Substance Use Topics  . Alcohol use: No  . Drug use: No     Allergies   Patient has no known allergies.   Review of Systems Review of Systems  Reason unable to perform ROS: See HPI as above.     Physical Exam Triage Vital Signs ED Triage Vitals  Enc Vitals Group     BP 02/28/18 1656 112/78     Pulse Rate 02/28/18 1656 89     Resp  02/28/18 1656 16     Temp 02/28/18 1656 98.6 F (37 C)     Temp Source 02/28/18 1656 Oral     SpO2 02/28/18 1656 100 %     Weight 02/28/18 1657 200 lb (90.7 kg)     Height --      Head Circumference --      Peak Flow --      Pain Score 02/28/18 1657 10     Pain Loc --      Pain Edu? --      Excl. in [place]? --    No data found.  Updated Vital Signs BP 112/78   Pulse 89   Temp 98.6 F (37 C) (Oral)   Resp 16   Wt 200 lb (90.7 kg)   LMP 01/31/2018   SpO2 100%   BMI 35.43 kg/m   Physical Exam  Constitutional: She is oriented to person, place, and time. She appears well-developed and well-nourished.  Non-toxic appearance. She does not appear ill. No distress.  HENT:  Head: Normocephalic and atraumatic.  Right Ear: External ear and ear canal normal. Tympanic membrane is not erythematous and not bulging.  Left Ear: External ear and ear  canal normal. Tympanic membrane is not erythematous and not bulging.  Nose: Right sinus exhibits no maxillary sinus tenderness and no frontal sinus tenderness. Left sinus exhibits maxillary sinus tenderness and frontal sinus tenderness.  Mouth/Throat: Uvula is midline, oropharynx is clear and moist and mucous membranes are normal.  No tenderness to palpation of the temporal region.   Bilateral TM opaque.  Eyes: Pupils are equal, round, and reactive to light. Conjunctivae and EOM are normal.  Neck: Normal range of motion. Neck supple. No spinous process tenderness and no muscular tenderness present. Normal range of motion present.  Cardiovascular: Normal rate, regular rhythm and normal heart sounds. Exam reveals no gallop and no friction rub.  No murmur heard. Pulmonary/Chest: Effort normal and breath sounds normal. She has no decreased breath sounds. She has no wheezes. She has no rhonchi. She has no rales.  Lymphadenopathy:    She has no cervical adenopathy.  Neurological: She is alert and oriented to person, place, and time. She has normal strength. She is not disoriented. No cranial nerve deficit or sensory deficit. She displays a negative Romberg sign. Coordination and gait normal. GCS eye subscore is 4. GCS verbal subscore is 5. GCS motor subscore is 6.  Normal rapid movement, finger-to-nose.  Patient able to ambulate on and off exam table without difficulty.  Skin: Skin is warm and dry.  Psychiatric: She has a normal mood and affect. Her behavior is normal. Judgment normal.     UC Treatments / Results  Labs (all labs ordered are listed, but only abnormal results are displayed) Labs Reviewed - No data to display  EKG None  Radiology No results found.  Procedures Procedures (including critical care time)  Medications Ordered in UC Medications  ketorolac (TORADOL) injection 60 mg (60 mg Intramuscular Given 02/28/18 1802)  metoCLOPramide (REGLAN) injection 5 mg ( Intramuscular  Given 02/28/18 1801)  dexamethasone (DECADRON) injection 10 mg (10 mg Intramuscular Given 02/28/18 1801)    Initial Impression / Assessment and Plan / UC Course  I have reviewed the triage vital signs and the nursing notes.  Pertinent labs & imaging results that were available during my care of the patient were reviewed by me and considered in my medical decision making (see chart for details).    Normal since  on exam.  Toradol, Reglan, Decadron injection in office today.  Given sinus tenderness on exam, will cover with Flonase and prednisone.  Push fluids.  Return precautions given.  Otherwise follow-up with PCP if symptoms still not improving for further evaluation.  Patient expressed understanding and agrees to plan.  Final Clinical Impressions(s) / UC Diagnoses   Final diagnoses:  Acute nonintractable headache, unspecified headache type   ED Prescriptions    Medication Sig Dispense Auth. Provider   predniSONE (DELTASONE) 50 MG tablet Take 1 tablet (50 mg total) by mouth daily for 5 days. 5 tablet Yu, Amy V, PA-C   fluticasone (FLONASE) 50 MCG/ACT nasal spray Place 2 sprays into both nostrils daily. 1 g Yu, Amy V, PA-C   meloxicam (MOBIC) 7.5 MG tablet Take 1 tablet (7.5 mg total) by mouth daily. 15 tablet Tobin Chad, [place] 02/28/18 [phone]

## 2018-02-28 NOTE — Discharge Instructions (Addendum)
No alarming signs on exam.  Toradol, Reglan, Decadron injection in office today.  As discussed, will cover for possible sinus pressure given exam, Flonase and prednisone as directed.  Mobic as needed for further headache. Keep hydrated, your urine should be clear to pale yellow in color.  Follow-up with primary care for further evaluation if symptoms not improving.  If experiencing worsening of symptoms, headache/blurry vision, nausea/vomiting, confusion/altered mental status, dizziness, weakness, passing out, imbalance, go to the emergency department for further evaluation.

## 2018-02-28 NOTE — ED Triage Notes (Signed)
PT reports a headache for 2 weeks. No history of migraines. Other symptoms include aggitation, irritation, fatigue. Denies anxiety.   States that a family member just got brain tumors removed last week.

## 2018-02-28 NOTE — ED Triage Notes (Signed)
Has tried tylenol, naproxen and tramadol without relief.

## 2018-03-01 MED FILL — MELOXICAM 7.5 MG TABLET: 7.5 | 15 days supply | Qty: 15 | Fill #0

## 2018-03-01 MED FILL — FLUTICASONE PROP 50 MCG SPR: 50 | 30 days supply | Qty: 16 | Fill #0

## 2018-03-01 MED FILL — predniSONE 10 MG TABS: 10 | 5 days supply | Qty: 25 | Fill #0

## 2018-03-05 MED FILL — traMADol HCL 50 MG TABS: 50 | 30 days supply | Qty: 60 | Fill #3

## 2018-03-12 ENCOUNTER — Ambulatory Visit (INDEPENDENT_AMBULATORY_CARE_PROVIDER_SITE_OTHER): Payer: Self-pay | Admitting: Physician Assistant

## 2018-03-25 ENCOUNTER — Ambulatory Visit: Payer: Self-pay

## 2018-03-26 ENCOUNTER — Encounter: Payer: Self-pay | Attending: Physical Medicine & Rehabilitation

## 2018-03-26 ENCOUNTER — Ambulatory Visit: Payer: Self-pay | Admitting: Physical Medicine & Rehabilitation

## 2018-03-26 DIAGNOSIS — Z791 Long term (current) use of non-steroidal anti-inflammatories (NSAID): Secondary | ICD-10-CM | POA: Insufficient documentation

## 2018-03-26 DIAGNOSIS — Z79899 Other long term (current) drug therapy: Secondary | ICD-10-CM | POA: Insufficient documentation

## 2018-03-26 DIAGNOSIS — Z825 Family history of asthma and other chronic lower respiratory diseases: Secondary | ICD-10-CM | POA: Insufficient documentation

## 2018-03-26 DIAGNOSIS — M4316 Spondylolisthesis, lumbar region: Secondary | ICD-10-CM | POA: Insufficient documentation

## 2018-03-26 DIAGNOSIS — G8929 Other chronic pain: Secondary | ICD-10-CM | POA: Insufficient documentation

## 2018-04-03 ENCOUNTER — Other Ambulatory Visit: Payer: Self-pay

## 2018-04-03 ENCOUNTER — Encounter (INDEPENDENT_AMBULATORY_CARE_PROVIDER_SITE_OTHER): Payer: Self-pay | Admitting: Physician Assistant

## 2018-04-03 ENCOUNTER — Ambulatory Visit (INDEPENDENT_AMBULATORY_CARE_PROVIDER_SITE_OTHER): Payer: Self-pay | Admitting: Physician Assistant

## 2018-04-03 VITALS — BP 105/73 | HR 66 | Temp 98.0°F | Ht 63.0 in | Wt 195.8 lb

## 2018-04-03 DIAGNOSIS — M4310 Spondylolisthesis, site unspecified: Secondary | ICD-10-CM

## 2018-04-03 DIAGNOSIS — G8929 Other chronic pain: Secondary | ICD-10-CM

## 2018-04-03 DIAGNOSIS — Z23 Encounter for immunization: Secondary | ICD-10-CM

## 2018-04-03 DIAGNOSIS — K089 Disorder of teeth and supporting structures, unspecified: Secondary | ICD-10-CM

## 2018-04-03 DIAGNOSIS — F40298 Other specified phobia: Secondary | ICD-10-CM

## 2018-04-03 MED ORDER — ALPRAZOLAM ER 2 MG PO TB24: 2.0000 mg | ORAL_TABLET | Freq: Once | ORAL | 0 refills | Status: AC

## 2018-04-03 MED ORDER — PREGABALIN 75 MG PO CAPS: 75.0000 mg | ORAL_CAPSULE | Freq: Two times a day (BID) | ORAL | 5 refills | Status: DC

## 2018-04-03 MED ORDER — NAPROXEN 500 MG PO TABS: 500.0000 mg | ORAL_TABLET | Freq: Two times a day (BID) | ORAL | 0 refills | Status: DC

## 2018-04-03 MED FILL — NAPROXEN 500 MG TABLET: 500 | 15 days supply | Qty: 30 | Fill #0

## 2018-04-03 NOTE — Progress Notes (Signed)
Subjective:  Patient ID: Wanda Crane, female    DOB: [birth year]  Age: 27 y.o. MRN: [id number]  CC: dental pain, ear pain, migraine   HPI  Wanda Crane a 27 y.o.femalewith grade 1 spondylolisthesis of L5-S1 presentswith dental pain, right ear pain, and possible migraine. Dental pain is chronic and attributed to an erupting right upper wisdom tooth. Feels as though right ear pain may be associated with right upper wisdom tooth pain. Has periorbital  headache just above the right eyebrow that waxes and wanes. Pains are sharp and last approximately two minutes. May experience 2-3 headaches per day. Has not taken any thing specifically for the headaches but she is already taking Tramadol and gabapentin for her back pain. Says gabapentin has been causing a pressure-like pain in the lower extremities when she lays down to sleep at night. The pain is not present at other times of the day. Currently being managed by Dr. Letta Pate at pain clinic and was scheduled to have medial branch blocks L3 - L4 as well as L5 dorsal ramus. However, patient did not keep appointment due to fear of injections. Pt was prescribed Valium but did not fill because the pill cost $25. Says she will see Dr. Letta Pate again to discuss MBB. Does not endorse any other symptoms or complaints.      Outpatient Medications Prior to Visit  Medication Sig Dispense Refill  . gabapentin (NEURONTIN) 300 MG capsule Take 3 capsules (900 mg total) by mouth 3 (three) times daily. 270 capsule 3  . traMADol (ULTRAM) 50 MG tablet TAKE 1 TABLET BY MOUTH 2 TIMES DAILY. 60 tablet 3  . fluticasone (FLONASE) 50 MCG/ACT nasal spray Place 2 sprays into both nostrils daily. (Patient not taking: Reported on 04/03/2018) 1 g 0  . meloxicam (MOBIC) 7.5 MG tablet Take 1 tablet (7.5 mg total) by mouth daily. (Patient not taking: Reported on 04/03/2018) 15 tablet 0  . ondansetron (ZOFRAN ODT) 4 MG disintegrating tablet Take 1 tablet (4 mg total) by mouth  every 8 (eight) hours as needed for nausea or vomiting. (Patient not taking: Reported on 04/03/2018) 10 tablet 0   No facility-administered medications prior to visit.      ROS Review of Systems  Constitutional: Negative for chills, fever and malaise/fatigue.  Eyes: Negative for blurred vision.  Respiratory: Negative for shortness of breath.   Cardiovascular: Negative for chest pain and palpitations.  Gastrointestinal: Negative for abdominal pain and nausea.  Genitourinary: Negative for dysuria and hematuria.  Musculoskeletal: Negative for joint pain and myalgias.  Skin: Negative for rash.  Neurological: Negative for tingling and headaches.  Psychiatric/Behavioral: Negative for depression. The patient is not nervous/anxious.     Objective:  BP 105/73 (BP Location: Left Arm, Patient Position: Sitting, Cuff Size: Large)   Pulse 66   Temp 98 F (36.7 C) (Oral)   Ht 5\' 3"  (1.6 m)   Wt 195 lb 12.8 oz (88.8 kg)   SpO2 98%   BMI 34.68 kg/m   BP/Weight 04/03/2018 02/28/2018 2/87/8676  Systolic BP 720 947 096  Diastolic BP 73 78 81  Wt. (Lbs) 195.8 200 -  BMI 34.68 35.43 -      Physical Exam  Constitutional: She is oriented to person, place, and time.  Well developed, well nourished, NAD, polite  HENT:  Head: Normocephalic and atraumatic.  Erupting upper wisdom tooth on right side.  Eyes: No scleral icterus.  Neck: Normal range of motion. Neck supple. No thyromegaly present.  Cardiovascular: Normal rate,  regular rhythm and normal heart sounds.  Pulmonary/Chest: Effort normal and breath sounds normal.  Abdominal: Soft. Bowel sounds are normal. There is no tenderness.  Musculoskeletal: She exhibits no edema.  Neurological: She is alert and oriented to person, place, and time.  Skin: Skin is warm and dry. No rash noted. No erythema. No pallor.  Psychiatric: She has a normal mood and affect. Her behavior is normal. Thought content normal.  Vitals reviewed.    Assessment &  Plan:    1. Chronic dental pain - Begin  naproxen (NAPROSYN) 500 MG tablet; Take 1 tablet (500 mg total) by mouth 2 (two) times daily with a meal.  Dispense: 30 tablet; Refill: 0 - Ambulatory referral to Dentistry  2. Needle phobia - Begin ALPRAZolam (XANAX XR) 2 MG 24 hr tablet; Take 1 tablet (2 mg total) by mouth once for 1 dose. Take one hour before procedure.  Dispense: 1 tablet; Refill: 0 - Keep appointment with Dr. Letta Pate at [facility] clinic for MBB.  3. Need for prophylactic vaccination and inoculation against influenza - Flu Vaccine QUAD 6+ mos PF IM (Fluarix Quad PF)   Meds ordered this encounter  Medications  . ALPRAZolam (XANAX XR) 2 MG 24 hr tablet    Sig: Take 1 tablet (2 mg total) by mouth once for 1 dose. Take one hour before procedure.    Dispense:  1 tablet    Refill:  0    Order Specific Question:   Supervising Provider    Answer:   Charlott Rakes [[id number]]  . naproxen (NAPROSYN) 500 MG tablet    Sig: Take 1 tablet (500 mg total) by mouth 2 (two) times daily with a meal.    Dispense:  30 tablet    Refill:  0    Order Specific Question:   Supervising Provider    Answer:   Charlott Rakes [[id number]]    Follow-up: Return if symptoms worsen or fail to improve.   Clent Demark PA

## 2018-04-03 NOTE — Patient Instructions (Signed)
Alprazolam extended-relase tablets What is this medicine? ALPRAZOLAM (al PRAY zoe lam) is a benzodiazepine. It is used to treat anxiety and panic attacks. This medicine may be used for other purposes; ask your health care provider or pharmacist if you have questions. COMMON BRAND NAME(S): Xanax XR What should I tell my health care provider before I take this medicine? They need to know if you have any of these conditions: -an alcohol or drug abuse problem -bipolar disorder, depression, psychosis or other mental health conditions -glaucoma -kidney or liver disease -lung or breathing disease -myasthenia gravis -Parkinson's disease -porphyria -seizures or a history of seizures -suicidal thoughts -an unusual or allergic reaction to alprazolam, other benzodiazepines, foods, dyes, or preservatives -pregnant or trying to get pregnant -breast-feeding How should I use this medicine? Take this medicine by mouth. Follow the directions on the prescription label. Do not cut, crush, chew or divide the tablets. Swallow the tablets whole with a drink of water. Take your medicine at regular intervals. Do not take it more often than directed. Do not stop taking except on your doctor's advice. A special MedGuide will be given to you by the pharmacist with each prescription and refill. Be sure to read this information carefully each time. Talk to your pediatrician regarding the use of this medicine in children. Special care may be needed. Overdosage: If you think you have taken too much of this medicine contact a poison control center or emergency room at once. NOTE: This medicine is only for you. Do not share this medicine with others. What if I miss a dose? If you miss a dose, take it as soon as you can. If it is almost time for your next dose, take only that dose. Do not take double or extra doses. What may interact with this medicine? Do not take this medicine with any of the following  medications: -certain antiviral medicines for HIV or AIDS like delavirdine, indinavir -certain medicines for fungal infections like ketoconazole and itraconazole -narcotic medicines for cough -sodium oxybate This medicine may also interact with the following medications: -alcohol -antihistamines for allergy, cough and cold -certain antibiotics like clarithromycin, erythromycin, isoniazid, rifampin, rifapentine, rifabutin, and troleandomycin -certain medicines for blood pressure, heart disease, irregular heart beat -certain medicines for depression, like amitriptyline, fluoxetine, sertraline -certain medicines for seizures like carbamazepine, oxcarbazepine, phenobarbital, phenytoin, primidone -cimetidine -cyclosporine -female hormones, like estrogens or progestins and birth control pills, patches, rings, or injections -general anesthetics like halothane, isoflurane, methoxyflurane, propofol -grapefruit juice -local anesthetics like lidocaine, pramoxine, tetracaine -medicines that relax muscles for surgery -narcotic medicines for pain -other antiviral medicines for HIV or AIDS -phenothiazines like chlorpromazine, mesoridazine, prochlorperazine, thioridazine This list may not describe all possible interactions. Give your health care provider a list of all the medicines, herbs, non-prescription drugs, or dietary supplements you use. Also tell them if you smoke, drink alcohol, or use illegal drugs. Some items may interact with your medicine. What should I watch for while using this medicine? Tell your doctor or health care professional if your symptoms do not start to get better or if they get worse. Do not stop taking except on your doctor's advice. You may develop a severe reaction. Your doctor will tell you how much medicine to take. You may get drowsy or dizzy. Do not drive, use machinery, or do anything that needs mental alertness until you know how this medicine affects you. To reduce the  risk of dizzy and fainting spells, do not stand or sit up quickly,  especially if you are an older patient. Alcohol may increase dizziness and drowsiness. Avoid alcoholic drinks. If you are taking another medicine that also causes drowsiness, you may have more side effects. Give your health care provider a list of all medicines you use. Your doctor will tell you how much medicine to take. Do not take more medicine than directed. Call emergency for help if you have problems breathing or unusual sleepiness. What side effects may I notice from receiving this medicine? Side effects that you should report to your doctor or health care professional as soon as possible: -allergic reactions like skin rash, itching or hives, swelling of the face, lips, or tongue -breathing problems -confusion -loss of balance or coordination -signs and symptoms of low blood pressure like dizziness; feeling faint or lightheaded, falls; unusually weak or tired -suicidal thoughts or other mood changes Side effects that usually do not require medical attention (report to your doctor or health care professional if they continue or are bothersome): -dizziness -dry mouth -nausea, vomiting -tiredness This list may not describe all possible side effects. Call your doctor for medical advice about side effects. You may report side effects to FDA at 1-800-FDA-1088. Where should I keep my medicine? Keep out of the reach of children. This medicine can be abused. Keep your medicine in a safe place to protect it from theft. Do not share this medicine with anyone. Selling or giving away this medicine is dangerous and against the law. This medicine may cause accidental overdose and death if taken by other adults, children, or pets. Mix any unused medicine with a substance like cat litter or coffee grounds. Then throw the medicine away in a sealed container like a sealed bag or a coffee can with a lid. Do not use the medicine after the  expiration date. Store at room temperature between 15 and 30 degrees C (59 and 86 degrees F). NOTE: This sheet is a summary. It may not cover all possible information. If you have questions about this medicine, talk to your doctor, pharmacist, or health care provider.  2018 Elsevier/Gold Standard (2015-04-22 13:57:39)

## 2018-04-05 ENCOUNTER — Encounter: Payer: Self-pay | Admitting: Physical Medicine & Rehabilitation

## 2018-04-05 ENCOUNTER — Ambulatory Visit (HOSPITAL_BASED_OUTPATIENT_CLINIC_OR_DEPARTMENT_OTHER): Payer: No Typology Code available for payment source | Admitting: Physical Medicine & Rehabilitation

## 2018-04-05 VITALS — BP 77/60 | HR 81 | Ht 63.0 in | Wt 195.0 lb

## 2018-04-05 DIAGNOSIS — M4310 Spondylolisthesis, site unspecified: Secondary | ICD-10-CM

## 2018-04-05 MED ORDER — TRAMADOL HCL 50 MG PO TABS: 50.0000 mg | ORAL_TABLET | Freq: Two times a day (BID) | ORAL | 3 refills | Status: DC

## 2018-04-05 MED FILL — traMADol HCL 50 MG TABS: 50 | 30 days supply | Qty: 60 | Fill #0

## 2018-04-05 NOTE — Progress Notes (Signed)
Subjective:    Patient ID: Wanda Crane, female    DOB: [birth year], 27 y.o.   MRN: [id number]  HPI 27 year old female with history of chronic low back pain.  She has no numbness or tingling in her legs.  Her symptoms have been going on for years.  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  She has had no's progression in her symptoms. She continues on tramadol 50 mg twice daily. More recently she was started on Naprosyn 500 twice daily She is independent with all her self-care and mobility.  She is ambulating without assistive device.  She complains of pain with shopping and housework.   Patient had last visit in April 2019.  Urine drug screen was consistent at that time. Recommendations for medial branch blocks L3-L4 as well as L5 dorsal ramus injection under fluoroscopic guidance.  The patient states that the Valium prescription given for pre-procedure anxiety cost $25.  I looked on good Rx today in its available for $2. Nevertheless she canceled her appointment because of fear of needles.  She followed up with her primary doctor who prescribed Xanax which she filled today.  The patient does not have a driver today for her injection. Pain Inventory Average Pain 8 Pain Right Now 10 My pain is constant, sharp, burning, dull, stabbing, tingling and aching  In the last 24 hours, has pain interfered with the following? General activity 9 Relation with others 9 Enjoyment of life 9 What TIME of day is your pain at its worst? all Sleep (in general) Poor  Pain is worse with: walking, bending, sitting, inactivity, standing and some activites Pain improves with: rest, therapy/exercise and medication Relief from Meds: 5  Mobility use a cane how many minutes can you walk? 5 ability to climb steps?  no do you drive?  yes  Function not employed: date last employed n/a I need assistance with the following:   meal prep, household duties and shopping  Neuro/Psych depression anxiety  Prior Studies Any changes since last visit?  no  Physicians involved in your care Any changes since last visit?  no Primary care Roderic Ovens   Family History  Problem Relation Age of Onset  . Asthma Mother   . COPD Mother   . Diabetes Maternal Grandmother   . Heart disease Maternal Grandmother   . Stroke Maternal Grandmother   . Diabetes Maternal Grandfather   . Heart disease Maternal Grandfather   . Stroke Maternal Grandfather    Social History   Socioeconomic History  . Marital status: Single    Spouse name: Not on file  . Number of children: Not on file  . Years of education: Not on file  . Highest education level: Not on file  Occupational History  . Not on file  Social Needs  . Financial resource strain: Not on file  . Food insecurity:    Worry: Not on file    Inability: Not on file  . Transportation needs:    Medical: Not on file    Non-medical: Not on file  Tobacco Use  . Smoking status: Never Smoker  . Smokeless tobacco: Never Used  Substance and Sexual Activity  . Alcohol use: No  . Drug use: No  . Sexual activity: Yes    Birth control/protection: None  Lifestyle  . Physical activity:    Days per week: Not on file    Minutes per session: Not on file  . Stress:  Not on file  Relationships  . Social connections:    Talks on phone: Not on file    Gets together: Not on file    Attends religious service: Not on file    Active member of club or organization: Not on file    Attends meetings of clubs or organizations: Not on file    Relationship status: Not on file  Other Topics Concern  . Not on file  Social History Narrative  . Not on file   No past surgical history on file. Past Medical History:  Diagnosis Date  . Chronic right-sided low back pain with right-sided sciatica 04/13/2017  . Hemorrhagic cyst of ovary   . Spondylolisthesis, grade 1 11/27/2016   BP (!) 77/60    Pulse 81   Ht 5\' 3"  (1.6 m)   Wt 195 lb (88.5 kg)   LMP 04/01/2018   SpO2 98%   BMI 34.54 kg/m   Opioid Risk Score:   Fall Risk Score:  `1  Depression screen PHQ 2/9  Depression screen [facility] 2/9 04/03/2018 01/18/2018 11/08/2017 09/18/2017 07/06/2017 04/13/2017 02/22/2017  Decreased Interest 3 1 2 2  0 3 0  Down, Depressed, Hopeless 3 2 2 2  0 3 0  PHQ - 2 Score 6 3 4 4  0 6 0  Altered sleeping 3 3 2 3  - 3 -  Tired, decreased energy 3 3 2 3  - 3 -  Change in appetite 3 2 2 2  - 2 -  Feeling bad or failure about yourself  2 1 1  0 - 0 -  Trouble concentrating 3 3 3 2  - 1 -  Moving slowly or fidgety/restless 2 3 2 2  - 2 -  Suicidal thoughts 0 0 0 0 - 0 -  PHQ-9 Score 22 18 16 16  - 17 -  Difficult doing work/chores - - - - - Very difficult -    Review of Systems  Constitutional: Positive for chills, diaphoresis, fever and unexpected weight change.  HENT: Negative.   Eyes: Negative.   Respiratory: Negative.   Cardiovascular: Negative.   Gastrointestinal: Positive for abdominal pain and nausea.  Endocrine: Negative.   Genitourinary: Negative.   Musculoskeletal: Negative.   Skin: Negative.   Allergic/Immunologic: Negative.   Neurological: Negative.   Hematological: Negative.   Psychiatric/Behavioral: Negative.   All other systems reviewed and are negative.      Objective:   Physical Exam  Constitutional: She is oriented to person, place, and time. She appears well-developed and well-nourished. No distress.  HENT:  Head: Normocephalic and atraumatic.  Eyes: Pupils are equal, round, and reactive to light. EOM are normal.  Musculoskeletal:       Lumbar back: She exhibits decreased range of motion and tenderness.  Lumbar range of motion is 75% forward flexion extension lateral bending and rotation. Mild tenderness palpation in L4 and L5 paraspinal areas. Positive Faber bilaterally with pain in the right PSIS area    Neurological: She is alert and oriented to person, place, and  time. No sensory deficit. Coordination and gait normal.  Motor strength is 5/5 bilateral hip flexor knee extensor ankle dorsiflexors.  Sensation is intact in bilateral L2-L3-L4 L5-S1 dermatomal distribution Negative straight leg raising Gait is without evidence of toe drag or knee instability   Skin: Skin is warm and dry. She is not diaphoretic.  Psychiatric: She has a normal mood and affect.  Nursing note and vitals reviewed.         Assessment & Plan:  #1.  Lumbar spondylolisthesis L5-S1 no signs of radiculopathy.  We discussed that she may have facet mediated pain.  Will do facet medial branch blocks right side L3-L4 and right L5 dorsal ramus next visit Patient will take her Xanax preinjection  If the medial branch blocks are not helpful in reducing at least 50% of pain, would consider sacroiliac injection under fluoroscopic guidance.  Use spine model to discussed injection anatomy  Reviewed PMP aware website appropriate, UDS appropriate in April 2019, no need for repeat at the current time.  Has a sign controlled substance agreement. Continue tramadol 50 mg twice daily

## 2018-04-09 ENCOUNTER — Ambulatory Visit (HOSPITAL_BASED_OUTPATIENT_CLINIC_OR_DEPARTMENT_OTHER): Payer: No Typology Code available for payment source | Admitting: Physical Medicine & Rehabilitation

## 2018-04-09 ENCOUNTER — Encounter: Payer: Self-pay | Admitting: Physical Medicine & Rehabilitation

## 2018-04-09 ENCOUNTER — Encounter: Payer: No Typology Code available for payment source | Attending: Physical Medicine & Rehabilitation

## 2018-04-09 ENCOUNTER — Other Ambulatory Visit: Payer: Self-pay | Admitting: Family Medicine

## 2018-04-09 VITALS — BP 97/67 | HR 91 | Ht 63.0 in | Wt 196.0 lb

## 2018-04-09 DIAGNOSIS — M4310 Spondylolisthesis, site unspecified: Secondary | ICD-10-CM

## 2018-04-09 DIAGNOSIS — M4316 Spondylolisthesis, lumbar region: Secondary | ICD-10-CM | POA: Insufficient documentation

## 2018-04-09 DIAGNOSIS — M47816 Spondylosis without myelopathy or radiculopathy, lumbar region: Secondary | ICD-10-CM

## 2018-04-09 MED ORDER — PREGABALIN 75 MG PO CAPS: 75.0000 mg | ORAL_CAPSULE | Freq: Two times a day (BID) | ORAL | 1 refills | Status: DC

## 2018-04-09 NOTE — Progress Notes (Signed)
  PROCEDURE RECORD  Physical Medicine and Rehabilitation   Name: Vihana Kydd DOB:26-Nov-1990 MRN: [id number]  Date:04/09/2018  Physician: Alysia Penna, MD    Nurse/CMA: Jermeka Schlotterbeck CMA / Wessling CMA  Allergies: No Known Allergies  Consent Signed: Yes.    Is patient diabetic? No.  CBG today? NA  Pregnant: No. LMP: Patient's last menstrual period was 04/01/2018. (age 28-55)  Anticoagulants: no Anti-inflammatory: yes (naproxen last night) Antibiotics: no  Procedure: Right L3-5 MBB Position: Prone   Start Time: 11:59am End Time: 12:06pm Fluoro Time: 33s  RN/CMA Wessling CMA Javonne Louissaint CMA    Time 1150am 12:11    BP 97/67 120/85    Pulse 91 70    Respirations 16 16    O2 Sat 96 97    S/S 6 6    Pain Level 9/10 7/10     D/C home with Jermaine, patient A & O X 3, D/C instructions reviewed, and sits independently.

## 2018-04-09 NOTE — Progress Notes (Signed)
Right lumbar L3, L4 medial branch blocks and L5 dorsal ramus injection under fluoroscopic guidance  Indication: Right Lumbar pain which is not relieved by medication management or other conservative care and interfering with self-care and mobility.  Informed consent was obtained after describing risks and benefits of the procedure with the patient, this includes bleeding, bruising, infection, paralysis and medication side effects. The patient wishes to proceed and has given written consent. The patient was placed in a prone position. The lumbar area was marked and prepped with Betadine. One ML of 1% lidocaine was injected into each of 3 areas into the skin and subcutaneous tissue. Then a 22-gauge 5in  spinal needle was inserted targeting the junction of the Right S1 superior articular process and sacral ala junction. Needle was advanced under fluoroscopic guidance. Bone contact was made.Isovue 200 was injected x0.5 mL demonstrating no intravascular uptake. Then a solution containing 2% MPF lidocaine was injected x0.5 mL. Then the Right L5 superior articular process in transverse process junction was targeted. Bone contact was made.Isovue 200 was injected x0.5 mL demonstrating no intravascular uptake. Then a solution containing 2% MPF lidocaine was injected x0.5 mL. Then the Right L4 superior articular process in transverse process junction was targeted. Bone contact was made. Isovue 200 was injected x0.5 mL demonstrating no intravascular uptake. Then a solution containing2% MPF lidocaine was injected x0.5 mL Patient tolerated procedure well. Post procedure instructions were given. Please refer to post procedure form. 

## 2018-04-09 NOTE — Patient Instructions (Signed)

## 2018-05-06 MED FILL — traMADol HCL 50 MG TABS: 50 | 30 days supply | Qty: 60 | Fill #1

## 2018-05-07 ENCOUNTER — Ambulatory Visit: Payer: No Typology Code available for payment source | Admitting: Physical Medicine & Rehabilitation

## 2018-05-07 ENCOUNTER — Ambulatory Visit: Payer: Self-pay | Admitting: Obstetrics and Gynecology

## 2018-05-07 ENCOUNTER — Encounter: Payer: Self-pay | Admitting: General Practice

## 2018-05-07 ENCOUNTER — Encounter: Payer: Self-pay | Admitting: Obstetrics and Gynecology

## 2018-05-07 NOTE — Progress Notes (Unsigned)
Patient no showed for appt in office today. Per Dr Ilda Basset, patient can reschedule on her own.

## 2018-05-13 ENCOUNTER — Ambulatory Visit: Payer: No Typology Code available for payment source | Admitting: Physical Medicine & Rehabilitation

## 2018-05-13 ENCOUNTER — Encounter: Payer: No Typology Code available for payment source | Attending: Physical Medicine & Rehabilitation

## 2018-05-13 ENCOUNTER — Emergency Department (HOSPITAL_COMMUNITY)
Admission: EM | Admit: 2018-05-13 | Discharge: 2018-05-13 | Disposition: A | Discharge status: Home / Self Care | Payer: No Typology Code available for payment source | Attending: Emergency Medicine | Admitting: Emergency Medicine

## 2018-05-13 ENCOUNTER — Other Ambulatory Visit: Payer: Self-pay

## 2018-05-13 ENCOUNTER — Encounter (HOSPITAL_COMMUNITY): Payer: Self-pay | Admitting: Emergency Medicine

## 2018-05-13 DIAGNOSIS — K0889 Other specified disorders of teeth and supporting structures: Secondary | ICD-10-CM | POA: Insufficient documentation

## 2018-05-13 DIAGNOSIS — G8929 Other chronic pain: Secondary | ICD-10-CM | POA: Insufficient documentation

## 2018-05-13 DIAGNOSIS — Z825 Family history of asthma and other chronic lower respiratory diseases: Secondary | ICD-10-CM | POA: Insufficient documentation

## 2018-05-13 DIAGNOSIS — M4316 Spondylolisthesis, lumbar region: Secondary | ICD-10-CM | POA: Insufficient documentation

## 2018-05-13 DIAGNOSIS — Z791 Long term (current) use of non-steroidal anti-inflammatories (NSAID): Secondary | ICD-10-CM | POA: Insufficient documentation

## 2018-05-13 DIAGNOSIS — Z79899 Other long term (current) drug therapy: Secondary | ICD-10-CM | POA: Insufficient documentation

## 2018-05-13 MED ORDER — OXYCODONE-ACETAMINOPHEN 5-325 MG PO TABS
1.0000 | ORAL_TABLET | Freq: Once | ORAL | Status: AC
  Administered 2018-05-13: 1 via ORAL
  Filled 2018-05-13: qty 1

## 2018-05-13 MED ORDER — NAPROXEN 500 MG PO TABS: 500.0000 mg | ORAL_TABLET | Freq: Two times a day (BID) | ORAL | 0 refills | Status: DC

## 2018-05-13 MED ORDER — PENICILLIN V POTASSIUM 500 MG PO TABS: 500.0000 mg | ORAL_TABLET | Freq: Four times a day (QID) | ORAL | 0 refills | Status: AC

## 2018-05-13 MED ORDER — PENICILLIN V POTASSIUM 250 MG PO TABS
500.0000 mg | ORAL_TABLET | Freq: Once | ORAL | Status: AC
  Administered 2018-05-13: 500 mg via ORAL
  Filled 2018-05-13: qty 2

## 2018-05-13 NOTE — Discharge Instructions (Addendum)
You were evaluated in the Emergency Department and after careful evaluation, we did not find any emergent condition requiring admission or further testing in the hospital.  Your symptoms today seem to be due to a tooth infection.  Please use antibiotics as directed and follow-up with a dentist as soon as you can.  Please return to the Emergency Department if you experience any worsening of your condition.  We encourage you to follow up with a primary care provider.  Thank you for allowing [place] to be a part of your care.

## 2018-05-13 NOTE — ED Provider Notes (Signed)
[facility] Emergency Department Provider Note MRN:  [id number]  Arrival date & time: 05/13/18     Chief Complaint   Dental Pain   History of Present Illness   Wanda Crane is a 27 y.o. year-old female with no pertinent past medical history presenting to the ED with chief complaint of dental pain.  The pain began 3 days ago, located in the left side of her mouth, maxillary molars.  Initially began hurting her when she was eating something cold, has progressively worsened.  Pain is moderate in severity, constant, worse with palpation.  Denies fevers or chills, no nausea or vomiting, no trouble breathing.  Has been trying to call dentist all day but unable to be seen today.  Review of Systems  A complete 10 system review of systems was obtained and all systems are negative except as noted in the HPI and PMH.   Patient's Health History    Past Medical History:  Diagnosis Date  . Chronic right-sided low back pain with right-sided sciatica 04/13/2017  . Hemorrhagic cyst of ovary   . Spondylolisthesis, grade 1 11/27/2016    History reviewed. No pertinent surgical history.  Family History  Problem Relation Age of Onset  . Asthma Mother   . COPD Mother   . Diabetes Maternal Grandmother   . Heart disease Maternal Grandmother   . Stroke Maternal Grandmother   . Diabetes Maternal Grandfather   . Heart disease Maternal Grandfather   . Stroke Maternal Grandfather     Social History   Socioeconomic History  . Marital status: Single    Spouse name: Not on file  . Number of children: Not on file  . Years of education: Not on file  . Highest education level: Not on file  Occupational History  . Not on file  Social Needs  . Financial resource strain: Not on file  . Food insecurity:    Worry: Not on file    Inability: Not on file  . Transportation needs:    Medical: Not on file    Non-medical: Not on file  Tobacco Use  . Smoking status: Never Smoker  . Smokeless  tobacco: Never Used  Substance and Sexual Activity  . Alcohol use: No  . Drug use: No  . Sexual activity: Yes    Birth control/protection: None  Lifestyle  . Physical activity:    Days per week: Not on file    Minutes per session: Not on file  . Stress: Not on file  Relationships  . Social connections:    Talks on phone: Not on file    Gets together: Not on file    Attends religious service: Not on file    Active member of club or organization: Not on file    Attends meetings of clubs or organizations: Not on file    Relationship status: Not on file  . Intimate partner violence:    Fear of current or ex partner: Not on file    Emotionally abused: Not on file    Physically abused: Not on file    Forced sexual activity: Not on file  Other Topics Concern  . Not on file  Social History Narrative  . Not on file     Physical Exam  Vital Signs and Nursing Notes reviewed Vitals:   05/13/18 1953  BP: 131/76  Pulse: 88  Resp: 18  Temp: 98.4 F (36.9 C)  SpO2: 98%    CONSTITUTIONAL: Well-appearing, NAD NEURO:  Alert  and oriented x 3, no focal deficits EYES:  eyes equal and reactive ENT/NECK:  no LAD, no JVD; generally poor dentition, tenderness to palpation to the left maxillary molar with no evident gingival abscess CARDIO: Regular rate, well-perfused, normal S1 and S2 PULM:  CTAB no wheezing or rhonchi GI/GU:  normal bowel sounds, non-distended, non-tender MSK/SPINE:  No gross deformities, no edema SKIN:  no rash, atraumatic PSYCH:  Appropriate speech and behavior  Diagnostic and Interventional Summary    Labs Reviewed - No data to display  No orders to display    Medications  oxyCODONE-acetaminophen (PERCOCET/ROXICET) 5-325 MG per tablet 1 tablet (has no administration in time range)  penicillin v potassium (VEETID) tablet 500 mg (has no administration in time range)     Procedures Critical Care  ED Course and Medical Decision Making  I have reviewed the  triage vital signs and the nursing notes.  Pertinent labs & imaging results that were available during my care of the patient were reviewed by me and considered in my medical decision making (see below for details).  Favored uncomplicated dental infection, prescription for penicillin.  After the discussed management above, the patient was determined to be safe for discharge.  The patient was in agreement with this plan and all questions regarding their care were answered.  ED return precautions were discussed and the patient will return to the ED with any significant worsening of condition.  Barth Kirks. Sedonia Small, [place] [email or web address]  Final Clinical Impressions(s) / ED Diagnoses     ICD-10-CM   1. Pain, dental K08.89     ED Discharge Orders         Ordered    naproxen (NAPROSYN) 500 MG tablet  2 times daily     05/13/18 2031    penicillin v potassium (VEETID) 500 MG tablet  4 times daily     05/13/18 2031             Maudie Flakes, MD 05/13/18 2034

## 2018-05-13 NOTE — ED Triage Notes (Signed)
Patient with week long pain on upper right jaw.  Patient states that she felt something "pop" and now has some swelling to upper left cheek.

## 2018-05-13 NOTE — ED Notes (Signed)
PT states understanding of care given, follow up care, and medication prescribed. PT ambulated from ED to car with a steady gait. 

## 2018-05-14 MED FILL — PENICILLIN VK 500 MG TABLET: 500 | 7 days supply | Qty: 28 | Fill #0

## 2018-05-22 ENCOUNTER — Telehealth: Payer: Self-pay | Admitting: *Deleted

## 2018-05-22 DIAGNOSIS — M4310 Spondylolisthesis, site unspecified: Secondary | ICD-10-CM

## 2018-05-22 MED ORDER — TRAMADOL HCL 50 MG PO TABS
50.0000 mg | ORAL_TABLET | Freq: Two times a day (BID) | ORAL | 0 refills | Status: DC
Start: 2018-05-22 — End: 2018-05-27

## 2018-05-22 MED FILL — traMADol HCL 50 MG TABS: 50 | 14 days supply | Qty: 28 | Fill #0

## 2018-05-22 NOTE — Telephone Encounter (Signed)
Spoke with the Pharmacist from [facility], will send a two week prescription for Tramadol. Placed a call to Ms. Hashemi regarding the above, no answer. Left message to return the call.

## 2018-05-22 NOTE — Telephone Encounter (Signed)
[facility] called again.

## 2018-05-22 NOTE — Telephone Encounter (Signed)
Spoke to patient. She is grateful. She asked to schedule a follow up.  Appointment made with Dr. Letta Pate October 21st at 12:45pm

## 2018-05-22 NOTE — Telephone Encounter (Signed)
Return Wanda Crane, call she reports she doesn't know what happened to her Tramadol. According to the PMP Aware Web-site the Tramadol was filled on 05/06/2018.  Ms. Cardamone  most  likely will have to pay cash for her Tramadol for 2 weeks. This provider will call [place] and call her with an update, she verbalizes understanding.   Placed a call to [place], they are at lunch, will call back.

## 2018-05-22 NOTE — Telephone Encounter (Signed)
Patient left a message stating she has lost her tramadol. Went down to [place]. Came back. Can't find it. This is a Dr. Letta Pate patient. He is on vacation. Routed to Danella Sensing, ANP for assessment.

## 2018-05-27 ENCOUNTER — Ambulatory Visit (HOSPITAL_BASED_OUTPATIENT_CLINIC_OR_DEPARTMENT_OTHER): Payer: No Typology Code available for payment source | Admitting: Physical Medicine & Rehabilitation

## 2018-05-27 ENCOUNTER — Encounter: Payer: Self-pay | Admitting: Physical Medicine & Rehabilitation

## 2018-05-27 ENCOUNTER — Other Ambulatory Visit: Payer: Self-pay

## 2018-05-27 ENCOUNTER — Encounter (INDEPENDENT_AMBULATORY_CARE_PROVIDER_SITE_OTHER): Payer: Self-pay | Admitting: Physician Assistant

## 2018-05-27 ENCOUNTER — Ambulatory Visit (INDEPENDENT_AMBULATORY_CARE_PROVIDER_SITE_OTHER): Payer: Self-pay | Admitting: Physician Assistant

## 2018-05-27 VITALS — BP 101/69 | HR 69 | Temp 98.0°F | Ht 63.0 in | Wt 200.4 lb

## 2018-05-27 VITALS — BP 107/71 | HR 88 | Ht 63.0 in | Wt 200.0 lb

## 2018-05-27 DIAGNOSIS — M4310 Spondylolisthesis, site unspecified: Secondary | ICD-10-CM

## 2018-05-27 DIAGNOSIS — M5416 Radiculopathy, lumbar region: Secondary | ICD-10-CM

## 2018-05-27 DIAGNOSIS — Z98818 Other dental procedure status: Secondary | ICD-10-CM

## 2018-05-27 DIAGNOSIS — R51 Headache: Secondary | ICD-10-CM

## 2018-05-27 DIAGNOSIS — F418 Other specified anxiety disorders: Secondary | ICD-10-CM

## 2018-05-27 DIAGNOSIS — R519 Headache, unspecified: Secondary | ICD-10-CM

## 2018-05-27 MED ORDER — DULOXETINE HCL 30 MG PO CPEP
ORAL_CAPSULE | ORAL | 2 refills | Status: DC
Start: 2018-05-27 — End: 2018-06-24

## 2018-05-27 MED ORDER — MELOXICAM 15 MG PO TABS: 15.0000 mg | ORAL_TABLET | Freq: Every day | ORAL | 0 refills | Status: DC

## 2018-05-27 MED ORDER — DOXYCYCLINE HYCLATE 100 MG PO TABS
100.0000 mg | ORAL_TABLET | Freq: Two times a day (BID) | ORAL | 0 refills | Status: DC
Start: 2018-05-27 — End: 2018-06-24

## 2018-05-27 MED FILL — DOXYCYCLINE HYCLATE 100 MG: 100 | 10 days supply | Qty: 20 | Fill #0

## 2018-05-27 MED FILL — DULoxetine HCL 30 MG CPEP: 30 | 30 days supply | Qty: 60 | Fill #0

## 2018-05-27 MED FILL — MELOXICAM 15 MG TABLET: 15 | 30 days supply | Qty: 30 | Fill #0

## 2018-05-27 NOTE — Patient Instructions (Signed)
Generalized Anxiety Disorder, Adult Generalized anxiety disorder (GAD) is a mental health disorder. People with this condition constantly worry about everyday events. Unlike normal anxiety, worry related to GAD is not triggered by a specific event. These worries also do not fade or get better with time. GAD interferes with life functions, including relationships, work, and school. GAD can vary from mild to severe. People with severe GAD can have intense waves of anxiety with physical symptoms (panic attacks). What are the causes? The exact cause of GAD is not known. What increases the risk? This condition is more likely to develop in:  Women.  People who have a family history of anxiety disorders.  People who are very shy.  People who experience very stressful life events, such as the death of a loved one.  People who have a very stressful family environment.  What are the signs or symptoms? People with GAD often worry excessively about many things in their lives, such as their health and family. They may also be overly concerned about:  Doing well at work.  Being on time.  Natural disasters.  Friendships.  Physical symptoms of GAD include:  Fatigue.  Muscle tension or having muscle twitches.  Trembling or feeling shaky.  Being easily startled.  Feeling like your heart is pounding or racing.  Feeling out of breath or like you cannot take a deep breath.  Having trouble falling asleep or staying asleep.  Sweating.  Nausea, diarrhea, or irritable bowel syndrome (IBS).  Headaches.  Trouble concentrating or remembering facts.  Restlessness.  Irritability.  How is this diagnosed? Your health care provider can diagnose GAD based on your symptoms and medical history. You will also have a physical exam. The health care provider will ask specific questions about your symptoms, including how severe they are, when they started, and if they come and go. Your health care  provider may ask you about your use of alcohol or drugs, including prescription medicines. Your health care provider may refer you to a mental health specialist for further evaluation. Your health care provider will do a thorough examination and may perform additional tests to rule out other possible causes of your symptoms. To be diagnosed with GAD, a person must have anxiety that:  Is out of his or her control.  Affects several different aspects of his or her life, such as work and relationships.  Causes distress that makes him or her unable to take part in normal activities.  Includes at least three physical symptoms of GAD, such as restlessness, fatigue, trouble concentrating, irritability, muscle tension, or sleep problems.  Before your health care provider can confirm a diagnosis of GAD, these symptoms must be present more days than they are not, and they must last for six months or longer. How is this treated? The following therapies are usually used to treat GAD:  Medicine. Antidepressant medicine is usually prescribed for long-term daily control. Antianxiety medicines may be added in severe cases, especially when panic attacks occur.  Talk therapy (psychotherapy). Certain types of talk therapy can be helpful in treating GAD by providing support, education, and guidance. Options include: ? Cognitive behavioral therapy (CBT). People learn coping skills and techniques to ease their anxiety. They learn to identify unrealistic or negative thoughts and behaviors and to replace them with positive ones. ? Acceptance and commitment therapy (ACT). This treatment teaches people how to be mindful as a way to cope with unwanted thoughts and feelings. ? Biofeedback. This process trains you to   manage your body's response (physiological response) through breathing techniques and relaxation methods. You will work with a therapist while machines are used to monitor your physical symptoms.  Stress  management techniques. These include yoga, meditation, and exercise.  A mental health specialist can help determine which treatment is best for you. Some people see improvement with one type of therapy. However, other people require a combination of therapies. Follow these instructions at home:  Take over-the-counter and prescription medicines only as told by your health care provider.  Try to maintain a normal routine.  Try to anticipate stressful situations and allow extra time to manage them.  Practice any stress management or self-calming techniques as taught by your health care provider.  Do not punish yourself for setbacks or for not making progress.  Try to recognize your accomplishments, even if they are small.  Keep all follow-up visits as told by your health care provider. This is important. Contact a health care provider if:  Your symptoms do not get better.  Your symptoms get worse.  You have signs of depression, such as: ? A persistently sad, cranky, or irritable mood. ? Loss of enjoyment in activities that used to bring you joy. ? Change in weight or eating. ? Changes in sleeping habits. ? Avoiding friends or family members. ? Loss of energy for normal tasks. ? Feelings of guilt or worthlessness. Get help right away if:  You have serious thoughts about hurting yourself or others. If you ever feel like you may hurt yourself or others, or have thoughts about taking your own life, get help right away. You can go to your nearest emergency department or call:  Your local emergency services (911 in the [place]  A suicide crisis helpline, such as the National Suicide Prevention Lifeline at 1-800-273-8255. This is open 24 hours a day.  Summary  Generalized anxiety disorder (GAD) is a mental health disorder that involves worry that is not triggered by a specific event.  People with GAD often worry excessively about many things in their lives, such as their health and  family.  GAD may cause physical symptoms such as restlessness, trouble concentrating, sleep problems, frequent sweating, nausea, diarrhea, headaches, and trembling or muscle twitching.  A mental health specialist can help determine which treatment is best for you. Some people see improvement with one type of therapy. However, other people require a combination of therapies. This information is not intended to replace advice given to you by your health care provider. Make sure you discuss any questions you have with your health care provider. Document Released: 11/18/2012 Document Revised: 06/13/2016 Document Reviewed: 06/13/2016 Elsevier Interactive Patient Education  2018 [employer] Major Depressive Disorder, Adult Major depressive disorder (MDD) is a mental health condition. MDD often makes you feel sad, hopeless, or helpless. MDD can also cause symptoms in your body. MDD can affect your:  Work.  School.  Relationships.  Other normal activities.  MDD can range from mild to very bad. It may occur once (single episode MDD). It can also occur many times (recurrent MDD). The main symptoms of MDD often include:  Feeling sad, depressed, or irritable most of the time.  Loss of interest.  MDD symptoms also include:  Sleeping too much or too little.  Eating too much or too little.  A change in your weight.  Feeling tired (fatigue) or having low energy.  Feeling worthless.  Feeling guilty.  Trouble making decisions.  Trouble thinking clearly.  Thoughts of suicide or harming   others.  Feeling weak.  Feeling agitated.  Keeping yourself from being around other people (isolation).  Follow these instructions at home: Activity  Do these things as told by your doctor: ? Go back to your normal activities. ? Exercise regularly. ? Spend time outdoors. Alcohol  Talk with your doctor about how alcohol can affect your antidepressant medicines.  Do not drink alcohol. Or,  limit how much alcohol you drink. ? This means no more than 1 drink a day for nonpregnant women and 2 drinks a day for men. One drink equals one of these:  12 oz of beer.  5 oz of wine.  1 oz of hard liquor. General instructions  Take over-the-counter and prescription medicines only as told by your doctor.  Eat a healthy diet.  Get plenty of sleep.  Find activities that you enjoy. Make time to do them.  Think about joining a support group. Your doctor may be able to suggest a group for you.  Keep all follow-up visits as told by your doctor. This is important. Where to find more information:  National Alliance on Mental Illness: ? www.nami.org  [place] National Institute of Mental Health: ? www.nimh.nih.gov  National Suicide Prevention Lifeline: ? 1-800-273-8255. This is free, 24-hour help. Contact a doctor if:  Your symptoms get worse.  You have new symptoms. Get help right away if:  You self-harm.  You see, hear, taste, smell, or feel things that are not present (hallucinate). If you ever feel like you may hurt yourself or others, or have thoughts about taking your own life, get help right away. You can go to your nearest emergency department or call:  Your local emergency services (911 in the [place]  A suicide crisis helpline, such as the National Suicide Prevention Lifeline: ? 1-800-273-8255. This is open 24 hours a day.  This information is not intended to replace advice given to you by your health care provider. Make sure you discuss any questions you have with your health care provider. Document Released: 07/05/2015 Document Revised: 04/09/2016 Document Reviewed: 04/09/2016 Elsevier Interactive Patient Education  2017 Elsevier Inc.  

## 2018-05-27 NOTE — Progress Notes (Signed)
Subjective:    Patient ID: Wanda Crane, female    DOB: 01/24/1991, 27 y.o.   MRN: [id number] Patient underwent physical therapy at [facility] location from 5 /8 through 12/28/2016. Patient notes that there was no improvement, notes from primary care 12/27/2016. Indicate 20% reduction of pain with PT also 20% reduction in pain with gabapentin 600 mg 3 times a day. HPI Patient has complaints of increasing low back pain.  Patient has some lower extremity pain as well, this is affecting the right lower extremity.  She indicates that she has some weakness of the right lower extremity but has been able to ambulate without assistive device and do all her usual daily activities. PT last year made pain worse although primary care physician notes as noted above indicate that there was some minor improvement  Pain Inventory Average Pain 10 Pain Right Now 10 My pain is sharp, burning, stabbing, tingling and aching  In the last 24 hours, has pain interfered with the following? General activity 1 Relation with others 1 Enjoyment of life 1 What TIME of day is your pain at its worst? all Sleep (in general) Poor  Pain is worse with: walking, bending, sitting, inactivity, standing and some activites Pain improves with: nothing Relief from Meds: 0  Mobility walk with assistance use a cane how many minutes can you walk? 5 ability to climb steps?  no do you drive?  yes  Function not employed: date last employed . I need assistance with the following:  meal prep, household duties and shopping  Neuro/Psych depression  Prior Studies Any changes since last visit?  no  Physicians involved in your care Any changes since last visit?  no   Family History  Problem Relation Age of Onset  . Asthma Mother   . COPD Mother   . Diabetes Maternal Grandmother   . Heart disease Maternal Grandmother   . Stroke Maternal Grandmother   . Diabetes Maternal Grandfather   . Heart disease  Maternal Grandfather   . Stroke Maternal Grandfather    Social History   Socioeconomic History  . Marital status: Single    Spouse name: Not on file  . Number of children: Not on file  . Years of education: Not on file  . Highest education level: Not on file  Occupational History  . Not on file  Social Needs  . Financial resource strain: Not on file  . Food insecurity:    Worry: Not on file    Inability: Not on file  . Transportation needs:    Medical: Not on file    Non-medical: Not on file  Tobacco Use  . Smoking status: Never Smoker  . Smokeless tobacco: Never Used  Substance and Sexual Activity  . Alcohol use: No  . Drug use: No  . Sexual activity: Yes    Birth control/protection: None  Lifestyle  . Physical activity:    Days per week: Not on file    Minutes per session: Not on file  . Stress: Not on file  Relationships  . Social connections:    Talks on phone: Not on file    Gets together: Not on file    Attends religious service: Not on file    Active member of club or organization: Not on file    Attends meetings of clubs or organizations: Not on file    Relationship status: Not on file  Other Topics Concern  . Not on file  Social History  Narrative  . Not on file   History reviewed. No pertinent surgical history. Past Medical History:  Diagnosis Date  . Chronic right-sided low back pain with right-sided sciatica 04/13/2017  . Hemorrhagic cyst of ovary   . Spondylolisthesis, grade 1 11/27/2016   BP 107/71   Pulse 88   Ht 5\' 3"  (1.6 m)   Wt 200 lb (90.7 kg)   LMP 05/12/2018 (Exact Date)   SpO2 98%   BMI 35.43 kg/m   Opioid Risk Score:   Fall Risk Score:  `1  Depression screen PHQ 2/9  Depression screen [facility] 2/9 04/03/2018 01/18/2018 11/08/2017 09/18/2017 07/06/2017 04/13/2017 02/22/2017  Decreased Interest 3 1 2 2  0 3 0  Down, Depressed, Hopeless 3 2 2 2  0 3 0  PHQ - 2 Score 6 3 4 4  0 6 0  Altered sleeping 3 3 2 3  - 3 -  Tired, decreased energy 3 3 2  3  - 3 -  Change in appetite 3 2 2 2  - 2 -  Feeling bad or failure about yourself  2 1 1  0 - 0 -  Trouble concentrating 3 3 3 2  - 1 -  Moving slowly or fidgety/restless 2 3 2 2  - 2 -  Suicidal thoughts 0 0 0 0 - 0 -  PHQ-9 Score 22 18 16 16  - 17 -  Difficult doing work/chores - - - - - Very difficult -    Review of Systems  Constitutional: Positive for diaphoresis.  HENT: Negative.   Eyes: Negative.   Respiratory: Negative.   Cardiovascular: Negative.   Gastrointestinal: Negative.   Endocrine: Negative.   Genitourinary: Negative.   Musculoskeletal: Positive for back pain.  Skin: Positive for rash.  Allergic/Immunologic: Negative.   Neurological: Negative.   Psychiatric/Behavioral: Negative.   All other systems reviewed and are negative.      Objective:   Physical Exam  Constitutional: She is oriented to person, place, and time. She appears well-developed and well-nourished. No distress.  HENT:  Head: Normocephalic and atraumatic.  Eyes: Pupils are equal, round, and reactive to light. EOM are normal.  Musculoskeletal: Normal range of motion.  Neurological: She is alert and oriented to person, place, and time.  Skin: She is not diaphoretic.  Nursing note and vitals reviewed. Patient has limited lumbar flexion extension lateral bending rotation approximately 75% range. Negative straight leg raising Ambulates without assistive device no evidence of toe drag or knee instability.        Assessment & Plan:  1.  Chronic lumbar pain this has been worsening over the last several months.  This is been mainly right-sided she has no improvement with lumbar medial branch blocks right L3-L4 as well as right L5 dorsal ramus injection.  No significant improvement with physical therapy.  She is complaining of more pain going down the leg and does have reduced sensation to pinprick in L4-L5 and S1 dermatomal distribution on the right side. I think she has had adequate conservative care,  has used over-the-counter analgesics as well.  Will order MRI to evaluate for potential lumbar herniated nucleus pulposus causing lumbar radiculitis.RTC 3-4 wks , may need ESi

## 2018-05-27 NOTE — Progress Notes (Signed)
Subjective:  Patient ID: Wanda Crane, female    DOB: 1991/06/20  Age: 27 y.o. MRN: [id number]  CC: back pain  HPI Wanda Crane a 27 y.o.femalewith grade 1 spondylolisthesis of L5-S1 presentswith continue back pain. She is currently managed by Dr. Letta Pate at [facility]. Had right lumbar L3, L4 medial branch blocks and L5 dorsal ramus injection under fluoroscopic guidance last month which patient states was not helpful. Saw Dr. Letta Pate very  recently and will reportedly have an MRI done and a second round of injections. States Lyrica was not helpful at all. Gabapentin 900 mg TID provided only "20%" reduction in pain. Tramadol was mildly to moderately helpful. Says back pain is severely limiting and can not perform any heavy lifting or prolonged activities involving repetitive motion of the back. Pt is working on obtaining disability. 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 Says she does not like talking on phones because of headaches. Has not had neurological evaluation or evaluated for headaches here previously.  Patient is also depressed and anxious. PHQ9 24 and GAD7 18 in clinic today. States she went to dentistry and needs two teeth pulled. Did not have teeth pulled yet because they were deemed to be infected. Already took Augmentin, pain still present but better after taking augmentin. Awaiting next dental appointment in approximately one month.    Outpatient Medications Prior to Visit  Medication Sig Dispense Refill  . pregabalin (LYRICA) 75 MG capsule Take 1 capsule (75 mg total) by mouth 2 (two) times daily. 180 capsule 1  . traMADol (ULTRAM) 50 MG tablet Take 1 tablet (50 mg total) by mouth 2 (two) times daily. 28 tablet 0  . naproxen (NAPROSYN) 500 MG tablet Take 1 tablet (500 mg total) by mouth 2 (two) times daily. (Patient not taking: Reported on 05/27/2018) 30 tablet 0  . naproxen (NAPROSYN) 500 MG tablet  Take 1 tablet (500 mg total) by mouth 2 (two) times daily with a meal. 30 tablet 0   No facility-administered medications prior to visit.      ROS Review of Systems  Constitutional: Negative for chills, fever and malaise/fatigue.  HENT:       Mild dental pain  Eyes: Negative for blurred vision.  Respiratory: Negative for shortness of breath.   Cardiovascular: Negative for chest pain and palpitations.  Gastrointestinal: Negative for abdominal pain and nausea.  Genitourinary: Negative for dysuria and hematuria.  Musculoskeletal: Positive for back pain. Negative for joint pain and myalgias.  Skin: Negative for rash.  Neurological: Negative for tingling and headaches.  Psychiatric/Behavioral: Negative for depression. The patient is not nervous/anxious.     Objective:  BP 101/69 (BP Location: Left Arm, Patient Position: Sitting, Cuff Size: Large)   Pulse 69   Temp 98 F (36.7 C) (Oral)   Ht 5\' 3"  (1.6 m)   Wt 200 lb 6.4 oz (90.9 kg)   LMP 05/12/2018 (Exact Date)   SpO2 96%   BMI 35.50 kg/m   BP/Weight 05/27/2018 05/27/2018 53/01/1442  Systolic BP 154 008 676  Diastolic BP 69 71 76  Wt. (Lbs) 200.4 200 -  BMI 35.5 35.43 -      Physical Exam  Constitutional: She is oriented to person, place, and time.  Well developed, well nourished, NAD, polite  HENT:  Head: Normocephalic and atraumatic.  Eyes: No scleral icterus.  Neck: Normal range of motion.  Pulmonary/Chest: Effort normal.  Musculoskeletal: She exhibits no edema.  Neurological: She is alert and oriented to person, place, and time.  Normal gait  Skin: Skin is warm and dry. No rash noted. No erythema. No pallor.  Psychiatric: Her behavior is normal. Thought content normal.  Depressed mood, constricted affect  Vitals reviewed.    Assessment & Plan:    1. Spondylolisthesis, grade 1 - Begin meloxicam (MOBIC) 15 MG tablet; Take 1 tablet (15 mg total) by mouth daily.  Dispense: 30 tablet; Refill: 0 - Begin  DULoxetine (CYMBALTA) 30 MG capsule; Take one capsule daily for 15 days. Then take two capsules daily thereafter.  Dispense: 60 capsule; Refill: 2  2. Other dental procedure status - Begin before tooth extraction, doxycycline (VIBRA-TABS) 100 MG tablet; Take 1 tablet (100 mg total) by mouth 2 (two) times daily.  Dispense: 20 tablet; Refill: 0  3. Depression with anxiety  - PHQ9 24 and GAD7 18 in clinic today. - Begin DULoxetine (CYMBALTA) 30 MG capsule; Take one capsule daily for 15 days. Then take two capsules daily thereafter.  Dispense: 60 capsule; Refill: 2  4. Nonintractable headache, unspecified chronicity pattern, unspecified headache type - Begin meloxicam (MOBIC) 15 MG tablet; Take 1 tablet (15 mg total) by mouth daily.  Dispense: 30 tablet; Refill: 0 - Did not have enough time to properly assess for migraines. Will need to see on f/u.   Meds ordered this encounter  Medications  . meloxicam (MOBIC) 15 MG tablet    Sig: Take 1 tablet (15 mg total) by mouth daily.    Dispense:  30 tablet    Refill:  0    Order Specific Question:   Supervising Provider    Answer:   Charlott Rakes [[id number]]  . doxycycline (VIBRA-TABS) 100 MG tablet    Sig: Take 1 tablet (100 mg total) by mouth 2 (two) times daily.    Dispense:  20 tablet    Refill:  0    Order Specific Question:   Supervising Provider    Answer:   Charlott Rakes [[id number]]  . DULoxetine (CYMBALTA) 30 MG capsule    Sig: Take one capsule daily for 15 days. Then take two capsules daily thereafter.    Dispense:  60 capsule    Refill:  2    Order Specific Question:   Supervising Provider    Answer:   Charlott Rakes [[id number]]    Follow-up: Return in about 4 weeks (around 06/24/2018) for headache, depression, anxiety, back pain.   Clent Demark PA

## 2018-05-27 NOTE — Patient Instructions (Signed)
Will review MRI at next visit Depending on results may need epidural injection

## 2018-06-03 MED FILL — traMADol HCL 50 MG TABS: 50 | 30 days supply | Qty: 60 | Fill #2

## 2018-06-17 ENCOUNTER — Encounter: Payer: Self-pay | Admitting: Physical Medicine & Rehabilitation

## 2018-06-17 ENCOUNTER — Ambulatory Visit (HOSPITAL_BASED_OUTPATIENT_CLINIC_OR_DEPARTMENT_OTHER): Payer: No Typology Code available for payment source | Admitting: Physical Medicine & Rehabilitation

## 2018-06-17 ENCOUNTER — Encounter: Payer: No Typology Code available for payment source | Attending: Physical Medicine & Rehabilitation

## 2018-06-17 ENCOUNTER — Ambulatory Visit: Payer: No Typology Code available for payment source | Admitting: Physical Medicine & Rehabilitation

## 2018-06-17 VITALS — BP 108/72 | HR 83 | Ht 63.0 in | Wt 192.0 lb

## 2018-06-17 DIAGNOSIS — Z825 Family history of asthma and other chronic lower respiratory diseases: Secondary | ICD-10-CM | POA: Insufficient documentation

## 2018-06-17 DIAGNOSIS — G8929 Other chronic pain: Secondary | ICD-10-CM | POA: Insufficient documentation

## 2018-06-17 DIAGNOSIS — Z79899 Other long term (current) drug therapy: Secondary | ICD-10-CM | POA: Insufficient documentation

## 2018-06-17 DIAGNOSIS — M4316 Spondylolisthesis, lumbar region: Secondary | ICD-10-CM | POA: Insufficient documentation

## 2018-06-17 DIAGNOSIS — M5442 Lumbago with sciatica, left side: Secondary | ICD-10-CM

## 2018-06-17 DIAGNOSIS — M5441 Lumbago with sciatica, right side: Secondary | ICD-10-CM

## 2018-06-17 DIAGNOSIS — Z791 Long term (current) use of non-steroidal anti-inflammatories (NSAID): Secondary | ICD-10-CM | POA: Insufficient documentation

## 2018-06-17 DIAGNOSIS — M4310 Spondylolisthesis, site unspecified: Secondary | ICD-10-CM

## 2018-06-17 NOTE — Progress Notes (Signed)
Subjective:    Patient ID: Wanda Crane, female    DOB: 07/06/1991, 27 y.o.   MRN: [id number]  HPI  Starting to get Left foot numbness in adddition to Righ tfoot numbness over the last 3 weeks.  No weakness in ankles , feet or toes.   No bowel or bladder issues  No problem wit hwalking   Pain Inventory Average Pain 10 Pain Right Now 10 My pain is sharp, burning, stabbing, tingling and aching  In the last 24 hours, has pain interfered with the following? General activity 1 Relation with others 2 Enjoyment of life 4 What TIME of day is your pain at its worst? daytime, evening  Sleep (in general) Poor  Pain is worse with: walking, bending, sitting, inactivity, standing and some activites Pain improves with: nothing Relief from Meds: 0  Mobility walk with assistance use a cane ability to climb steps?  no do you drive?  yes  Function not employed: date last employed .  Neuro/Psych numbness  Prior Studies Any changes since last visit?  no  Physicians involved in your care Any changes since last visit?  no   Family History  Problem Relation Age of Onset  . Asthma Mother   . COPD Mother   . Diabetes Maternal Grandmother   . Heart disease Maternal Grandmother   . Stroke Maternal Grandmother   . Diabetes Maternal Grandfather   . Heart disease Maternal Grandfather   . Stroke Maternal Grandfather    Social History   Socioeconomic History  . Marital status: Single    Spouse name: Not on file  . Number of children: Not on file  . Years of education: Not on file  . Highest education level: Not on file  Occupational History  . Not on file  Social Needs  . Financial resource strain: Not on file  . Food insecurity:    Worry: Not on file    Inability: Not on file  . Transportation needs:    Medical: Not on file    Non-medical: Not on file  Tobacco Use  . Smoking status: Never Smoker  . Smokeless tobacco: Never Used  Substance and Sexual Activity  .  Alcohol use: No  . Drug use: No  . Sexual activity: Yes    Birth control/protection: None  Lifestyle  . Physical activity:    Days per week: Not on file    Minutes per session: Not on file  . Stress: Not on file  Relationships  . Social connections:    Talks on phone: Not on file    Gets together: Not on file    Attends religious service: Not on file    Active member of club or organization: Not on file    Attends meetings of clubs or organizations: Not on file    Relationship status: Not on file  Other Topics Concern  . Not on file  Social History Narrative  . Not on file   History reviewed. No pertinent surgical history. Past Medical History:  Diagnosis Date  . Chronic right-sided low back pain with right-sided sciatica 04/13/2017  . Hemorrhagic cyst of ovary   . Spondylolisthesis, grade 1 11/27/2016   BP 108/72   Pulse 83   Ht 5\' 3"  (1.6 m)   Wt 192 lb (87.1 kg)   SpO2 97%   BMI 34.01 kg/m   Opioid Risk Score:   Fall Risk Score:  `1  Depression screen PHQ 2/9  Depression screen [facility] 2/9 05/27/2018  04/03/2018 01/18/2018 11/08/2017 09/18/2017 07/06/2017 04/13/2017  Decreased Interest 3 3 1 2 2  0 3  Down, Depressed, Hopeless 3 3 2 2 2  0 3  PHQ - 2 Score 6 6 3 4 4  0 6  Altered sleeping 3 3 3 2 3  - 3  Tired, decreased energy 3 3 3 2 3  - 3  Change in appetite 3 3 2 2 2  - 2  Feeling bad or failure about yourself  3 2 1 1  0 - 0  Trouble concentrating 3 3 3 3 2  - 1  Moving slowly or fidgety/restless 3 2 3 2 2  - 2  Suicidal thoughts 0 0 0 0 0 - 0  PHQ-9 Score 24 22 18 16 16  - 17  Difficult doing work/chores - - - - - - Very difficult    Review of Systems  Constitutional: Negative.   HENT: Negative.   Eyes: Negative.   Respiratory: Negative.   Cardiovascular: Negative.   Gastrointestinal: Negative.   Endocrine: Negative.   Genitourinary: Negative.   Musculoskeletal: Positive for back pain.  Skin: Negative.   Allergic/Immunologic: Negative.   Neurological: Positive  for numbness.  Hematological: Negative.   Psychiatric/Behavioral: Negative.   All other systems reviewed and are negative.      Objective:   Physical Exam  Constitutional: She is oriented to person, place, and time. She appears well-developed and well-nourished.  HENT:  Head: Normocephalic and atraumatic.  Eyes: Pupils are equal, round, and reactive to light. EOM are normal.  Musculoskeletal: Normal range of motion.  Neurological: She is alert and oriented to person, place, and time.  Skin: Skin is warm and dry.  Psychiatric: She has a normal mood and affect.  Nursing note and vitals reviewed.   Reduced Sharp dull discrimination Bilateral L4,5,S1 dermatomes Motor strength is 5/5 bilateral hip flexors knee extensors ankle dorsiflexors, as well as EHL Negative straight leg raising Deep tendon reflexes absent right patellar 2+ left patellar absent bilateral Achilles with facilitation. Lumbar spine no tenderness palpation along the lumbar paraspinals.  Evidence of posterior element deficit L5-S1 area Lumbar range of motion 75% flexion extension lateral bending and rotation     Assessment & Plan:  1.  Lumbar pain chronic with more recent onset of right greater than left lower extremity numbness.  She has a history of lumbar, L5-S1, spondylolisthesis this certainly could be progressing and causing some nerve root impingement.  Recommend MRI of the lumbar spine to further evaluate.  Will reevaluate in 3 weeks She will remain on Mobic and gabapentin as per primary care

## 2018-06-17 NOTE — Patient Instructions (Signed)
Please call our office if you don't hear from Wanda Crane by next week

## 2018-06-24 ENCOUNTER — Ambulatory Visit (INDEPENDENT_AMBULATORY_CARE_PROVIDER_SITE_OTHER): Payer: Self-pay | Admitting: Physician Assistant

## 2018-06-24 ENCOUNTER — Encounter (INDEPENDENT_AMBULATORY_CARE_PROVIDER_SITE_OTHER): Payer: Self-pay | Admitting: Physician Assistant

## 2018-06-24 ENCOUNTER — Other Ambulatory Visit: Payer: Self-pay

## 2018-06-24 VITALS — BP 104/67 | HR 78 | Temp 98.1°F | Ht 63.0 in | Wt 200.0 lb

## 2018-06-24 DIAGNOSIS — F418 Other specified anxiety disorders: Secondary | ICD-10-CM

## 2018-06-24 DIAGNOSIS — R51 Headache: Secondary | ICD-10-CM

## 2018-06-24 DIAGNOSIS — R519 Headache, unspecified: Secondary | ICD-10-CM

## 2018-06-24 DIAGNOSIS — M4310 Spondylolisthesis, site unspecified: Secondary | ICD-10-CM

## 2018-06-24 MED ORDER — MELOXICAM 15 MG PO TABS: 15.0000 mg | ORAL_TABLET | Freq: Every day | ORAL | 0 refills | Status: DC

## 2018-06-24 MED ORDER — GABAPENTIN 300 MG PO CAPS: 600.0000 mg | ORAL_CAPSULE | Freq: Three times a day (TID) | ORAL | 3 refills | Status: DC

## 2018-06-24 MED ORDER — DULOXETINE HCL 60 MG PO CPEP: 60.0000 mg | ORAL_CAPSULE | Freq: Every day | ORAL | 3 refills | Status: DC

## 2018-06-24 MED ORDER — SUMATRIPTAN SUCCINATE 25 MG PO TABS: 25.0000 mg | ORAL_TABLET | Freq: Every day | ORAL | 3 refills | Status: DC

## 2018-06-24 NOTE — Patient Instructions (Signed)
Living With Depression Everyone experiences occasional disappointment, sadness, and loss in their lives. When you are feeling down, blue, or sad for at least 2 weeks in a row, it may mean that you have depression. Depression can affect your thoughts and feelings, relationships, daily activities, and physical health. It is caused by changes in the way your brain functions. If you receive a diagnosis of depression, your health care provider will tell you which type of depression you have and what treatment options are available to you. If you are living with depression, there are ways to help you recover from it and also ways to prevent it from coming back. How to cope with lifestyle changes Coping with stress Stress is your body's reaction to life changes and events, both good and bad. Stressful situations may include:  Getting married.  The death of a spouse.  Losing a job.  Retiring.  Having a baby.  Stress can last just a few hours or it can be ongoing. Stress can play a major role in depression, so it is important to learn both how to cope with stress and how to think about it differently. Talk with your health care provider or a counselor if you would like to learn more about stress reduction. He or she may suggest some stress reduction techniques, such as:  Music therapy. This can include creating music or listening to music. Choose music that you enjoy and that inspires you.  Mindfulness-based meditation. This kind of meditation can be done while sitting or walking. It involves being aware of your normal breaths, rather than trying to control your breathing.  Centering prayer. This is a kind of meditation that involves focusing on a spiritual word or phrase. Choose a word, phrase, or sacred image that is meaningful to you and that brings you peace.  Deep breathing. To do this, expand your stomach and inhale slowly through your nose. Hold your breath for 3-5 seconds, then exhale  slowly, allowing your stomach muscles to relax.  Muscle relaxation. This involves intentionally tensing muscles then relaxing them.  Choose a stress reduction technique that fits your lifestyle and personality. Stress reduction techniques take time and practice to develop. Set aside 5-15 minutes a day to do them. Therapists can offer training in these techniques. The training may be covered by some insurance plans. Other things you can do to manage stress include:  Keeping a stress diary. This can help you learn what triggers your stress and ways to control your response.  Understanding what your limits are and saying no to requests or events that lead to a schedule that is too full.  Thinking about how you respond to certain situations. You may not be able to control everything, but you can control how you react.  Adding humor to your life by watching funny films or TV shows.  Making time for activities that help you relax and not feeling guilty about spending your time this way.  Medicines Your health care provider may suggest certain medicines if he or she feels that they will help improve your condition. Avoid using alcohol and other substances that may prevent your medicines from working properly (may interact). It is also important to:  Talk with your pharmacist or health care provider about all the medicines that you take, their possible side effects, and what medicines are safe to take together.  Make it your goal to take part in all treatment decisions (shared decision-making). This includes giving input on the side   effects of medicines. It is best if shared decision-making with your health care provider is part of your total treatment plan.  If your health care provider prescribes a medicine, you may not notice the full benefits of it for 4-8 weeks. Most people who are treated for depression need to be on medicine for at least 6-12 months after they feel better. If you are taking  medicines as part of your treatment, do not stop taking medicines without first talking to your health care provider. You may need to have the medicine slowly decreased (tapered) over time to decrease the risk of harmful side effects. Relationships Your health care provider may suggest family therapy along with individual therapy and drug therapy. While there may not be family problems that are causing you to feel depressed, it is still important to make sure your family learns as much as they can about your mental health. Having your family's support can help make your treatment successful. How to recognize changes in your condition Everyone has a different response to treatment for depression. Recovery from major depression happens when you have not had signs of major depression for two months. This may mean that you will start to:  Have more interest in doing activities.  Feel less hopeless than you did 2 months ago.  Have more energy.  Overeat less often, or have better or improving appetite.  Have better concentration.  Your health care provider will work with you to decide the next steps in your recovery. It is also important to recognize when your condition is getting worse. Watch for these signs:  Having fatigue or low energy.  Eating too much or too little.  Sleeping too much or too little.  Feeling restless, agitated, or hopeless.  Having trouble concentrating or making decisions.  Having unexplained physical complaints.  Feeling irritable, angry, or aggressive.  Get help as soon as you or your family members notice these symptoms coming back. How to get support and help from others How to talk with friends and family members about your condition Talking to friends and family members about your condition can provide you with one way to get support and guidance. Reach out to trusted friends or family members, explain your symptoms to them, and let them know that you are  working with a health care provider to treat your depression. Financial resources Not all insurance plans cover mental health care, so it is important to check with your insurance carrier. If paying for co-pays or counseling services is a problem, search for a local or county mental health care center. They may be able to offer public mental health care services at low or no cost when you are not able to see a private health care provider. If you are taking medicine for depression, you may be able to get the generic form, which may be less expensive. Some makers of prescription medicines also offer help to patients who cannot afford the medicines they need. Follow these instructions at home:  Get the right amount and quality of sleep.  Cut down on using caffeine, tobacco, alcohol, and other potentially harmful substances.  Try to exercise, such as walking or lifting small weights.  Take over-the-counter and prescription medicines only as told by your health care provider.  Eat a healthy diet that includes plenty of vegetables, fruits, whole grains, low-fat dairy products, and lean protein. Do not eat a lot of foods that are high in solid fats, added sugars, or salt.    Keep all follow-up visits as told by your health care provider. This is important. Contact a health care provider if:  You stop taking your antidepressant medicines, and you have any of these symptoms: ? Nausea. ? Headache. ? Feeling lightheaded. ? Chills and body aches. ? Not being able to sleep (insomnia).  You or your friends and family think your depression is getting worse. Get help right away if:  You have thoughts of hurting yourself or others. If you ever feel like you may hurt yourself or others, or have thoughts about taking your own life, get help right away. You can go to your nearest emergency department or call:  Your local emergency services (911 in the [place]  A suicide crisis helpline, such as the  [place] at 279 873 7707. This is open 24-hours a day.  Summary  If you are living with depression, there are ways to help you recover from it and also ways to prevent it from coming back.  Work with your health care team to create a management plan that includes counseling, stress management techniques, and healthy lifestyle habits. This information is not intended to replace advice given to you by your health care provider. Make sure you discuss any questions you have with your health care provider. Document Released: 06/26/2016 Document Revised: 06/26/2016 Document Reviewed: 06/26/2016 Elsevier Interactive Patient Education  2018 [place] After being diagnosed with an anxiety disorder, you may be relieved to know why you have felt or behaved a certain way. It is natural to also feel overwhelmed about the treatment ahead and what it will mean for your life. With care and support, you can manage this condition and recover from it. How to cope with anxiety Dealing with stress Stress is your body's reaction to life changes and events, both good and bad. Stress can last just a few hours or it can be ongoing. Stress can play a major role in anxiety, so it is important to learn both how to cope with stress and how to think about it differently. Talk with your health care provider or a counselor to learn more about stress reduction. He or she may suggest some stress reduction techniques, such as:  Music therapy. This can include creating or listening to music that you enjoy and that inspires you.  Mindfulness-based meditation. This involves being aware of your normal breaths, rather than trying to control your breathing. It can be done while sitting or walking.  Centering prayer. This is a kind of meditation that involves focusing on a word, phrase, or sacred image that is meaningful to you and that brings you peace.  Deep breathing. To do  this, expand your stomach and inhale slowly through your nose. Hold your breath for 3-5 seconds. Then exhale slowly, allowing your stomach muscles to relax.  Self-talk. This is a skill where you identify thought patterns that lead to anxiety reactions and correct those thoughts.  Muscle relaxation. This involves tensing muscles then relaxing them.  Choose a stress reduction technique that fits your lifestyle and personality. Stress reduction techniques take time and practice. Set aside 5-15 minutes a day to do them. Therapists can offer training in these techniques. The training may be covered by some insurance plans. Other things you can do to manage stress include:  Keeping a stress diary. This can help you learn what triggers your stress and ways to control your response.  Thinking about how you respond to certain situations.  You may not be able to control everything, but you can control your reaction.  Making time for activities that help you relax, and not feeling guilty about spending your time in this way.  Therapy combined with coping and stress-reduction skills provides the best chance for successful treatment. Medicines Medicines can help ease symptoms. Medicines for anxiety include:  Anti-anxiety drugs.  Antidepressants.  Beta-blockers.  Medicines may be used as the main treatment for anxiety disorder, along with therapy, or if other treatments are not working. Medicines should be prescribed by a health care provider. Relationships Relationships can play a big part in helping you recover. Try to spend more time connecting with trusted friends and family members. Consider going to couples counseling, taking family education classes, or going to family therapy. Therapy can help you and others better understand the condition. How to recognize changes in your condition Everyone has a different response to treatment for anxiety. Recovery from anxiety happens when symptoms decrease  and stop interfering with your daily activities at home or work. This may mean that you will start to:  Have better concentration and focus.  Sleep better.  Be less irritable.  Have more energy.  Have improved memory.  It is important to recognize when your condition is getting worse. Contact your health care provider if your symptoms interfere with home or work and you do not feel like your condition is improving. Where to find help and support: You can get help and support from these sources:  Self-help groups.  Online and [employer].  A trusted spiritual leader.  Couples counseling.  Family education classes.  Family therapy.  Follow these instructions at home:  Eat a healthy diet that includes plenty of vegetables, fruits, whole grains, low-fat dairy products, and lean protein. Do not eat a lot of foods that are high in solid fats, added sugars, or salt.  Exercise. Most adults should do the following: ? Exercise for at least 150 minutes each week. The exercise should increase your heart rate and make you sweat (moderate-intensity exercise). ? Strengthening exercises at least twice a week.  Cut down on caffeine, tobacco, alcohol, and other potentially harmful substances.  Get the right amount and quality of sleep. Most adults need 7-9 hours of sleep each night.  Make choices that simplify your life.  Take over-the-counter and prescription medicines only as told by your health care provider.  Avoid caffeine, alcohol, and certain over-the-counter cold medicines. These may make you feel worse. Ask your pharmacist which medicines to avoid.  Keep all follow-up visits as told by your health care provider. This is important. Questions to ask your health care provider  Would I benefit from therapy?  How often should I follow up with a health care provider?  How long do I need to take medicine?  Are there any long-term side effects of my medicine?  Are  there any alternatives to taking medicine? Contact a health care provider if:  You have a hard time staying focused or finishing daily tasks.  You spend many hours a day feeling worried about everyday life.  You become exhausted by worry.  You start to have headaches, feel tense, or have nausea.  You urinate more than normal.  You have diarrhea. Get help right away if:  You have a racing heart and shortness of breath.  You have thoughts of hurting yourself or others. If you ever feel like you may hurt yourself or others, or have thoughts about taking your  own life, get help right away. You can go to your nearest emergency department or call:  Your local emergency services (911 in the [place]  A suicide crisis helpline, such as the [place] at 343-509-7990. This is open 24-hours a day.  Summary  Taking steps to deal with stress can help calm you.  Medicines cannot cure anxiety disorders, but they can help ease symptoms.  Family, friends, and partners can play a big part in helping you recover from an anxiety disorder. This information is not intended to replace advice given to you by your health care provider. Make sure you discuss any questions you have with your health care provider. Document Released: 07/18/2016 Document Revised: 07/18/2016 Document Reviewed: 07/18/2016 Elsevier Interactive Patient Education  [employer].

## 2018-06-24 NOTE — Progress Notes (Signed)
Subjective:  Patient ID: Wanda Crane, female    DOB: Jun 10, 1991  Age: 27 y.o. MRN: [id number]  CC: f/u back pain, headache, depression with anxiety  HPI Wanda Crane a 27 y.o.femalewith grade 1 spondylolisthesis of L5-S1 presentswith back pain, headache, and depression/anxiety. Has chronic left sided temporal headaches since two months ago. Each headache lasts 15 to 20 minutes with 2 hour remission before next headache occurs. Has three to four episodes of headache per day.  Headaches occur every day, seven days a week. No watering of the left eye and not yet attributed to seasons/time of year as headaches are of recent onset. Pt does not endorse any triggers in particular. No other associated symptoms. PHQ9 24 and GAD7 18 nearly one month ago. Prescribed Cymbalta 30 mg which she is reportedly taking as directed. PHQ9 22 and GAD7 21 in clinic today. Says she does not know why she is anxious and "maybe" anxiety is stemmed from her back pain. Reports Cymbalta 30 mg is not helpful. She is currently managed by Dr. Letta Pate at [facility]. Pt has thus far failed right lumbar L3, L4 medial branch blocks and L5 dorsal ramus injection, Tramadol, Lyrica, and Gabapentin 2700 mg total per day which only provided "20%" relief of pain. Reported onset of bilateral foot numbness. Dr. Letta Pate thinks there may be progression of spondylolisthesis and has ordered MRI.       Outpatient Medications Prior to Visit  Medication Sig Dispense Refill  . DULoxetine (CYMBALTA) 30 MG capsule Take one capsule daily for 15 days. Then take two capsules daily thereafter. 60 capsule 2  . meloxicam (MOBIC) 15 MG tablet Take 1 tablet (15 mg total) by mouth daily. 30 tablet 0  . traMADol (ULTRAM) 50 MG tablet Take 50 mg by mouth 2 (two) times daily.  3  . doxycycline (VIBRA-TABS) 100 MG tablet Take 1 tablet (100 mg total) by mouth 2 (two) times daily. 20 tablet 0   No  facility-administered medications prior to visit.      ROS Review of Systems  Constitutional: Negative for chills, fever and malaise/fatigue.  Eyes: Negative for blurred vision.  Respiratory: Negative for shortness of breath.   Cardiovascular: Negative for chest pain and palpitations.  Gastrointestinal: Negative for abdominal pain and nausea.  Genitourinary: Negative for dysuria and hematuria.  Musculoskeletal: Positive for back pain. Negative for joint pain and myalgias.  Skin: Negative for rash.  Neurological: Positive for headaches. Negative for tingling.       Bilateral foot numbness  Psychiatric/Behavioral: Positive for depression. The patient is nervous/anxious.     Objective:  BP 104/67 (BP Location: Left Arm, Patient Position: Sitting, Cuff Size: Large)   Pulse 78   Temp 98.1 F (36.7 C) (Oral)   Ht 5\' 3"  (1.6 m)   Wt 200 lb (90.7 kg)   LMP 06/22/2018 (Exact Date)   SpO2 99%   BMI 35.43 kg/m   BP/Weight 06/24/2018 06/17/2018 80/99/8338  Systolic BP 250 539 767  Diastolic BP 67 72 69  Wt. (Lbs) 200 192 200.4  BMI 35.43 34.01 35.5      Physical Exam  Constitutional: She is oriented to person, place, and time.  Well developed, obese, NAD, reserved  HENT:  Head: Normocephalic and atraumatic.  Eyes: No scleral icterus.  Neck: Normal range of motion. Neck supple. No thyromegaly present.  Pulmonary/Chest: Effort normal.  Musculoskeletal: She exhibits no edema.  Neurological: She is alert and oriented to person, place, and time.  Normal gait.  Skin: Skin is warm and dry. No rash noted. No erythema. No pallor.  Psychiatric: She has a normal mood and affect. Her behavior is normal. Thought content normal.  Vitals reviewed.    Assessment & Plan:   1. Depression with anxiety - Ambulatory referral to Psychiatry - Increase Cymbalta 60 mg one tablet po qday x30 days, #30 3 refills.  - I have warned patient of the interaction between Tramadol and Cymbalta. I have  advised patient to stop based on complaint of headache. Headache etiology unknown at this point but better to stop Tramadol in order to avoid Serotonin Syndrome.   2. Chronic nonintractable headache, unspecified headache type - Ambulatory referral to Neurology. Suspect migraine vs cluster? - Refill meloxicam (MOBIC) 15 MG tablet; Take 1 tablet (15 mg total) by mouth daily.  Dispense: 30 tablet; Refill: 0 - Begin SUMAtriptan (IMITREX) 25 MG tablet; Take 1 tablet (25 mg total) by mouth daily. May take one more tablet two hours after the first. No more than two tablets per day.  Dispense: 20 tablet; Refill: 3  3. Spondylolisthesis, grade 1 - Refill meloxicam (MOBIC) 15 MG tablet; Take 1 tablet (15 mg total) by mouth daily.  Dispense: 30 tablet; Refill: 0 - Will await results of MRI ordered by Dr. Letta Pate.    Meds ordered this encounter  Medications  . DISCONTD: meloxicam (MOBIC) 15 MG tablet    Sig: Take 1 tablet (15 mg total) by mouth daily.    Dispense:  30 tablet    Refill:  0    Order Specific Question:   Supervising Provider    Answer:   Charlott Rakes [[id number]]  . DULoxetine (CYMBALTA) 60 MG capsule    Sig: Take 1 capsule (60 mg total) by mouth daily.    Dispense:  30 capsule    Refill:  3    Order Specific Question:   Supervising Provider    Answer:   Charlott Rakes [[id number]]  . gabapentin (NEURONTIN) 300 MG capsule    Sig: Take 2 capsules (600 mg total) by mouth 3 (three) times daily.    Dispense:  180 capsule    Refill:  3    Order Specific Question:   Supervising Provider    Answer:   Charlott Rakes [[id number]]  . meloxicam (MOBIC) 15 MG tablet    Sig: Take 1 tablet (15 mg total) by mouth daily.    Dispense:  30 tablet    Refill:  0    Order Specific Question:   Supervising Provider    Answer:   Charlott Rakes [[id number]]  . SUMAtriptan (IMITREX) 25 MG tablet    Sig: Take 1 tablet (25 mg total) by mouth daily. May take one more tablet two hours after the first. No more than two  tablets per day.    Dispense:  20 tablet    Refill:  3    Order Specific Question:   Supervising Provider    Answer:   Charlott Rakes [[id number]]    Follow-up: Return in about 6 weeks (around 08/05/2018) for headache.   Clent Demark PA

## 2018-06-27 ENCOUNTER — Ambulatory Visit
Admission: RE | Admit: 2018-06-27 | Discharge: 2018-06-27 | Disposition: A | Discharge status: Home / Self Care | Payer: No Typology Code available for payment source | Source: Ambulatory Visit | Attending: Physical Medicine & Rehabilitation | Admitting: Physical Medicine & Rehabilitation

## 2018-06-27 DIAGNOSIS — M5416 Radiculopathy, lumbar region: Secondary | ICD-10-CM

## 2018-06-27 DIAGNOSIS — M4310 Spondylolisthesis, site unspecified: Secondary | ICD-10-CM

## 2018-07-03 MED FILL — traMADol HCL 50 MG TABS: 50 | 30 days supply | Qty: 60 | Fill #3

## 2018-07-15 ENCOUNTER — Encounter: Payer: No Typology Code available for payment source | Attending: Physical Medicine & Rehabilitation

## 2018-07-15 ENCOUNTER — Ambulatory Visit: Payer: Self-pay | Admitting: Physical Medicine & Rehabilitation

## 2018-07-15 DIAGNOSIS — Z791 Long term (current) use of non-steroidal anti-inflammatories (NSAID): Secondary | ICD-10-CM | POA: Insufficient documentation

## 2018-07-15 DIAGNOSIS — Z825 Family history of asthma and other chronic lower respiratory diseases: Secondary | ICD-10-CM | POA: Insufficient documentation

## 2018-07-15 DIAGNOSIS — G8929 Other chronic pain: Secondary | ICD-10-CM | POA: Insufficient documentation

## 2018-07-15 DIAGNOSIS — Z79899 Other long term (current) drug therapy: Secondary | ICD-10-CM | POA: Insufficient documentation

## 2018-07-15 DIAGNOSIS — M4316 Spondylolisthesis, lumbar region: Secondary | ICD-10-CM | POA: Insufficient documentation

## 2018-07-27 ENCOUNTER — Other Ambulatory Visit: Payer: Self-pay

## 2018-07-27 ENCOUNTER — Encounter (HOSPITAL_BASED_OUTPATIENT_CLINIC_OR_DEPARTMENT_OTHER): Payer: Self-pay | Admitting: Emergency Medicine

## 2018-07-27 ENCOUNTER — Emergency Department (HOSPITAL_BASED_OUTPATIENT_CLINIC_OR_DEPARTMENT_OTHER)
Admission: EM | Admit: 2018-07-27 | Discharge: 2018-07-27 | Disposition: A | Discharge status: Home / Self Care | Payer: No Typology Code available for payment source | Attending: Emergency Medicine | Admitting: Emergency Medicine

## 2018-07-27 DIAGNOSIS — K0381 Cracked tooth: Secondary | ICD-10-CM | POA: Insufficient documentation

## 2018-07-27 DIAGNOSIS — K0889 Other specified disorders of teeth and supporting structures: Secondary | ICD-10-CM

## 2018-07-27 DIAGNOSIS — Z79899 Other long term (current) drug therapy: Secondary | ICD-10-CM | POA: Insufficient documentation

## 2018-07-27 DIAGNOSIS — K047 Periapical abscess without sinus: Secondary | ICD-10-CM | POA: Insufficient documentation

## 2018-07-27 MED ORDER — AMOXICILLIN-POT CLAVULANATE 875-125 MG PO TABS: 1.0000 | ORAL_TABLET | Freq: Two times a day (BID) | ORAL | 0 refills | Status: DC

## 2018-07-27 MED ORDER — NAPROXEN 500 MG PO TABS: 500.0000 mg | ORAL_TABLET | Freq: Two times a day (BID) | ORAL | 0 refills | Status: DC

## 2018-07-27 NOTE — Discharge Instructions (Signed)
Take the antibiotics as prescribed. Take the pain medication as needed. Follow-up with your dentist for further management. Return to ED for worsening symptoms, trouble breathing or trouble swallowing, worsening swelling, trouble opening your mouth or drooling.

## 2018-07-27 NOTE — ED Provider Notes (Signed)
[place] EMERGENCY DEPARTMENT Provider Note   CSN: [id number] Arrival date & time: 07/27/18  1403     History   Chief Complaint Chief Complaint  Patient presents with  . Dental Pain    HPI Wanda Crane is a 27 y.o. female who presents to ED for evaluation of 1 day history of left upper dental pain and facial swelling.  States that she has had intermittent pain associated with 1 of her molars as it chipped off several months ago.  She also states that she has had intermittent pain associated with her wisdom teeth.  She saw dentist 2 months ago and was told that she may need to have the remainder of the teeth pulled.  She has not been take any medications to help with her symptoms.  Pain is worse with palpation.  She denies any fevers, trouble breathing, trismus, drooling or neck pain.  HPI  Past Medical History:  Diagnosis Date  . Chronic right-sided low back pain with right-sided sciatica 04/13/2017  . Hemorrhagic cyst of ovary   . Spondylolisthesis, grade 1 11/27/2016    Patient Active Problem List   Diagnosis Date Noted  . Chronic right-sided low back pain with right-sided sciatica 04/13/2017  . Spondylolisthesis, grade 1 11/27/2016    History reviewed. No pertinent surgical history.   OB History    Gravida  0   Para  0   Term  0   Preterm  0   AB  0   Living  0     SAB  0   TAB  0   Ectopic  0   Multiple  0   Live Births  0            Home Medications    Prior to Admission medications   Medication Sig Start Date End Date Taking? Authorizing Provider  DULoxetine (CYMBALTA) 60 MG capsule Take 1 capsule (60 mg total) by mouth daily. 06/24/18  Yes Clent Demark, PA-C  gabapentin (NEURONTIN) 300 MG capsule Take 2 capsules (600 mg total) by mouth 3 (three) times daily. 06/24/18  Yes Clent Demark, PA-C  meloxicam (MOBIC) 15 MG tablet Take 1 tablet (15 mg total) by mouth daily. 06/24/18  Yes Clent Demark, PA-C    amoxicillin-clavulanate (AUGMENTIN) 875-125 MG tablet Take 1 tablet by mouth every 12 (twelve) hours. 07/27/18   Maysel Mccolm, PA-C  naproxen (NAPROSYN) 500 MG tablet Take 1 tablet (500 mg total) by mouth 2 (two) times daily. 07/27/18   Janiene Aarons, PA-C  SUMAtriptan (IMITREX) 25 MG tablet Take 1 tablet (25 mg total) by mouth daily. May take one more tablet two hours after the first. No more than two tablets per day. 06/24/18   Clent Demark, PA-C    Family History Family History  Problem Relation Age of Onset  . Asthma Mother   . COPD Mother   . Diabetes Maternal Grandmother   . Heart disease Maternal Grandmother   . Stroke Maternal Grandmother   . Diabetes Maternal Grandfather   . Heart disease Maternal Grandfather   . Stroke Maternal Grandfather     Social History Social History   Tobacco Use  . Smoking status: Never Smoker  . Smokeless tobacco: Never Used  Substance Use Topics  . Alcohol use: No  . Drug use: No     Allergies   Patient has no known allergies.   Review of Systems Review of Systems  Constitutional: Negative for chills and fever.  HENT: Positive for dental problem and facial swelling. Negative for sneezing and sore throat.   Respiratory: Negative for shortness of breath.   Musculoskeletal: Negative for neck pain.     Physical Exam Updated Vital Signs BP (!) 118/58   Pulse 78   Temp 98.3 F (36.8 C) (Oral)   Resp 16   Ht 5\' 3"  (1.6 m)   Wt 90.7 kg   LMP 06/16/2018 (Approximate)   SpO2 100%   BMI 35.43 kg/m   Physical Exam Vitals signs and nursing note reviewed.  Constitutional:      General: She is not in acute distress.    Appearance: She is well-developed. She is not diaphoretic.  HENT:     Head: Normocephalic and atraumatic.     Mouth/Throat:     Dentition: Abnormal dentition. Dental tenderness and dental abscesses present. No dental caries.      Comments: Chipped tooth noted in the area.  No gross dental abscess or site of  drainage at this time. No facial, neck or cheek swelling noted. No pooling of secretions or trismus.  Normal voice noted with no difficulty swallowing or breathing.  No submandibular erythema, edema or crepitus noted. Eyes:     General: No scleral icterus.    Conjunctiva/sclera: Conjunctivae normal.  Neck:     Musculoskeletal: Normal range of motion.  Pulmonary:     Effort: Pulmonary effort is normal. No respiratory distress.  Skin:    Findings: No rash.  Neurological:     Mental Status: She is alert.      ED Treatments / Results  Labs (all labs ordered are listed, but only abnormal results are displayed) Labs Reviewed - No data to display  EKG None  Radiology No results found.  Procedures Procedures (including critical care time)  Medications Ordered in ED Medications - No data to display   Initial Impression / Assessment and Plan / ED Course  I have reviewed the triage vital signs and the nursing notes.  Pertinent labs & imaging results that were available during my care of the patient were reviewed by me and considered in my medical decision making (see chart for details).     Patient with dentalgia. On exam, there is no evidence of a drainable abscess. No trismus, glossal elevation, unilateral tonsillar swelling. No evidence of retropharyngeal or peritonsillar abscess or Ludwig angina. Will treat with  Augmentin and anti-inflammatories. Pt instructed to follow-up with dentist as soon as possible. Resource guide provided with AVS.  Patient is hemodynamically stable, in NAD, and able to ambulate in the ED. Evaluation does not show pathology that would require ongoing emergent intervention or inpatient treatment. I explained the diagnosis to the patient. Pain has been managed and has no complaints prior to discharge. Patient is comfortable with above plan and is stable for discharge at this time. All questions were answered prior to disposition. Strict return precautions  for returning to the ED were discussed. Encouraged follow up with PCP.    Portions of this note were generated with [employer]. Dictation errors may occur despite best attempts at proofreading.  Final Clinical Impressions(s) / ED Diagnoses   Final diagnoses:  Pain, dental    ED Discharge Orders         Ordered    amoxicillin-clavulanate (AUGMENTIN) 875-125 MG tablet  Every 12 hours     07/27/18 1510    naproxen (NAPROSYN) 500 MG tablet  2 times daily     07/27/18 1510  Delia Heady, PA-C 07/27/18 1513    Isla Pence, MD 07/27/18 1626

## 2018-07-27 NOTE — ED Triage Notes (Signed)
Pt states she woke up with her left upper side of her mouth swollen. 10/10 pain.

## 2018-07-29 ENCOUNTER — Other Ambulatory Visit: Payer: Self-pay | Admitting: Physical Medicine & Rehabilitation

## 2018-07-29 DIAGNOSIS — M4310 Spondylolisthesis, site unspecified: Secondary | ICD-10-CM

## 2018-07-29 MED FILL — AMOX-CLAV 875-125 MG TABLET: 875-125 | 7 days supply | Qty: 14 | Fill #0

## 2018-08-01 MED FILL — traMADol HCL 50 MG TABS: 50 | 30 days supply | Qty: 60 | Fill #0

## 2018-08-05 ENCOUNTER — Ambulatory Visit (INDEPENDENT_AMBULATORY_CARE_PROVIDER_SITE_OTHER): Payer: Self-pay | Admitting: Physician Assistant

## 2018-08-12 ENCOUNTER — Encounter (HOSPITAL_COMMUNITY): Payer: Self-pay | Admitting: Emergency Medicine

## 2018-08-12 ENCOUNTER — Ambulatory Visit (HOSPITAL_COMMUNITY)
Admission: EM | Admit: 2018-08-12 | Discharge: 2018-08-12 | Disposition: A | Discharge status: Home / Self Care | Payer: Self-pay | Attending: Internal Medicine | Admitting: Internal Medicine

## 2018-08-12 ENCOUNTER — Ambulatory Visit (INDEPENDENT_AMBULATORY_CARE_PROVIDER_SITE_OTHER): Payer: Self-pay

## 2018-08-12 DIAGNOSIS — W231XXA Caught, crushed, jammed, or pinched between stationary objects, initial encounter: Secondary | ICD-10-CM

## 2018-08-12 DIAGNOSIS — M79641 Pain in right hand: Secondary | ICD-10-CM

## 2018-08-12 DIAGNOSIS — S60221A Contusion of right hand, initial encounter: Secondary | ICD-10-CM | POA: Insufficient documentation

## 2018-08-12 MED ORDER — IBUPROFEN 600 MG PO TABS: 600.0000 mg | ORAL_TABLET | Freq: Four times a day (QID) | ORAL | 0 refills | Status: DC | PRN

## 2018-08-12 NOTE — Discharge Instructions (Signed)
NO fracture Use anti-inflammatories for pain/swelling. You may take up to 800 mg Ibuprofen every 8 hours with food. You may supplement Ibuprofen with Tylenol [phone] mg every 8 hours.  Ice Ace wrap

## 2018-08-12 NOTE — ED Triage Notes (Signed)
Pt with bruising noted after she said she smashed hand in door

## 2018-08-13 NOTE — ED Provider Notes (Signed)
[facility]    CSN: [id number] Arrival date & time: 08/12/18  1948     History   Chief Complaint Chief Complaint  Patient presents with  . Hand Pain    HPI Wanda Crane is a 28 y.o. female noncontributing with medical history presenting today for evaluation of right hand injury.  Patient states that earlier today she accidentally smashed her right hand in the front door of her house.  Since she has developed pain, swelling and difficulty moving her fingers.  She does endorse some mild numbness and tingling.  Denies pain at the wrist or difficulty moving the wrist.  Denies previous injury to this hand.  Denies lack of sensation.  HPI  Past Medical History:  Diagnosis Date  . Chronic right-sided low back pain with right-sided sciatica 04/13/2017  . Hemorrhagic cyst of ovary   . Spondylolisthesis, grade 1 11/27/2016    Patient Active Problem List   Diagnosis Date Noted  . Chronic right-sided low back pain with right-sided sciatica 04/13/2017  . Spondylolisthesis, grade 1 11/27/2016    History reviewed. No pertinent surgical history.  OB History    Gravida  0   Para  0   Term  0   Preterm  0   AB  0   Living  0     SAB  0   TAB  0   Ectopic  0   Multiple  0   Live Births  0            Home Medications    Prior to Admission medications   Medication Sig Start Date End Date Taking? Authorizing Provider  amoxicillin-clavulanate (AUGMENTIN) 875-125 MG tablet Take 1 tablet by mouth every 12 (twelve) hours. 07/27/18   Khatri, Hina, PA-C  DULoxetine (CYMBALTA) 60 MG capsule Take 1 capsule (60 mg total) by mouth daily. 06/24/18   Clent Demark, PA-C  gabapentin (NEURONTIN) 300 MG capsule Take 2 capsules (600 mg total) by mouth 3 (three) times daily. 06/24/18   Clent Demark, PA-C  ibuprofen (ADVIL,MOTRIN) 600 MG tablet Take 1 tablet (600 mg total) by mouth every 6 (six) hours as needed. 08/12/18   Allysa Governale C, PA-C  meloxicam (MOBIC)  15 MG tablet Take 1 tablet (15 mg total) by mouth daily. 06/24/18   Clent Demark, PA-C  naproxen (NAPROSYN) 500 MG tablet Take 1 tablet (500 mg total) by mouth 2 (two) times daily. 07/27/18   Khatri, Hina, PA-C  SUMAtriptan (IMITREX) 25 MG tablet Take 1 tablet (25 mg total) by mouth daily. May take one more tablet two hours after the first. No more than two tablets per day. 06/24/18   Clent Demark, PA-C  traMADol (ULTRAM) 50 MG tablet TAKE 1 TABLET (50 MG TOTAL) BY MOUTH 2 (TWO) TIMES DAILY. 07/29/18   Kirsteins, Luanna Salk, MD    Family History Family History  Problem Relation Age of Onset  . Asthma Mother   . COPD Mother   . Diabetes Maternal Grandmother   . Heart disease Maternal Grandmother   . Stroke Maternal Grandmother   . Diabetes Maternal Grandfather   . Heart disease Maternal Grandfather   . Stroke Maternal Grandfather     Social History Social History   Tobacco Use  . Smoking status: Never Smoker  . Smokeless tobacco: Never Used  Substance Use Topics  . Alcohol use: No  . Drug use: No     Allergies   Patient has no known allergies.  Review of Systems Review of Systems  Constitutional: Negative for fatigue and fever.  Eyes: Negative for visual disturbance.  Respiratory: Negative for shortness of breath.   Cardiovascular: Negative for chest pain.  Gastrointestinal: Negative for abdominal pain, nausea and vomiting.  Musculoskeletal: Positive for joint swelling. Negative for arthralgias.  Skin: Positive for color change. Negative for rash and wound.  Neurological: Negative for dizziness, weakness, light-headedness and headaches.     Physical Exam Triage Vital Signs ED Triage Vitals  Enc Vitals Group     BP 08/12/18 2022 126/76     Pulse Rate 08/12/18 2022 66     Resp 08/12/18 2022 16     Temp 08/12/18 2022 98.2 F (36.8 C)     Temp Source 08/12/18 2022 Oral     SpO2 08/12/18 2022 99 %     Weight --      Height --      Head Circumference  --      Peak Flow --      Pain Score 08/12/18 2023 6     Pain Loc --      Pain Edu? --      Excl. in [place]? --    No data found.  Updated Vital Signs BP 126/76 (BP Location: Left Arm)   Pulse 66   Temp 98.2 F (36.8 C) (Oral)   Resp 16   SpO2 99%   Visual Acuity Right Eye Distance:   Left Eye Distance:   Bilateral Distance:    Right Eye Near:   Left Eye Near:    Bilateral Near:     Physical Exam Vitals signs and nursing note reviewed.  Constitutional:      Appearance: She is well-developed.     Comments: No acute distress  HENT:     Head: Normocephalic and atraumatic.     Nose: Nose normal.  Eyes:     Conjunctiva/sclera: Conjunctivae normal.  Neck:     Musculoskeletal: Neck supple.  Cardiovascular:     Rate and Rhythm: Normal rate.  Pulmonary:     Effort: Pulmonary effort is normal. No respiratory distress.  Abdominal:     General: There is no distension.  Musculoskeletal: Normal range of motion.     Comments: Right hand:, Swelling and superficial bruising to distal aspects of third through fifth MCPs, mild swelling extending into proximal third through fifth phalanges Limited range of motion at metacarpophalangeal joint as well as PIPs, full active range of motion at DIP  Full wrist flexion and extension, radial pulse 2+, cap refill less than 2 seconds  Skin:    General: Skin is warm and dry.  Neurological:     Mental Status: She is alert and oriented to person, place, and time.      UC Treatments / Results  Labs (all labs ordered are listed, but only abnormal results are displayed) Labs Reviewed - No data to display  EKG None  Radiology Dg Hand Complete Right  Result Date: 08/12/2018 CLINICAL DATA:  Shut hand in car door EXAM: RIGHT HAND - COMPLETE 3+ VIEW COMPARISON:  January 24, 2017 FINDINGS: Frontal, oblique, and lateral views obtained. No fracture or dislocation. Joint spaces appear normal. No erosive change. There is a degree of soft tissue  swelling. IMPRESSION: Soft tissue swelling. No fracture or dislocation. No appreciable arthropathy. Electronically Signed   By: Lowella Grip III M.D.   On: 08/12/2018 20:41    Procedures Procedures (including critical care time)  Medications Ordered in UC  Medications - No data to display  Initial Impression / Assessment and Plan / UC Course  I have reviewed the triage vital signs and the nursing notes.  Pertinent labs & imaging results that were available during my care of the patient were reviewed by me and considered in my medical decision making (see chart for details).     X-ray negative for acute bony abnormality.  Likely contusion.  Will treat as such with anti-inflammatories, ice, will provide Ace wrap to help immobilize fingers temporarily and help with compression and swelling.Discussed strict return precautions. Patient verbalized understanding and is agreeable with plan.  Final Clinical Impressions(s) / UC Diagnoses   Final diagnoses:  Contusion of right hand, initial encounter     Discharge Instructions     NO fracture Use anti-inflammatories for pain/swelling. You may take up to 800 mg Ibuprofen every 8 hours with food. You may supplement Ibuprofen with Tylenol [phone] mg every 8 hours.  Ice Ace wrap    ED Prescriptions    Medication Sig Dispense Auth. Provider   ibuprofen (ADVIL,MOTRIN) 600 MG tablet Take 1 tablet (600 mg total) by mouth every 6 (six) hours as needed. 30 tablet Sible Straley, [place] C, PA-C     Controlled Substance Prescriptions [place] Controlled Substance Registry consulted? Not Applicable   Janith Lima, [place] 08/13/18 [phone]

## 2018-08-22 ENCOUNTER — Ambulatory Visit: Payer: Self-pay | Admitting: Physical Medicine & Rehabilitation

## 2018-08-29 MED FILL — traMADol HCL 50 MG TABS: 50 | 30 days supply | Qty: 60 | Fill #1

## 2018-09-18 ENCOUNTER — Emergency Department (HOSPITAL_COMMUNITY)
Admission: EM | Admit: 2018-09-18 | Discharge: 2018-09-18 | Disposition: A | Discharge status: Home / Self Care | Payer: Self-pay | Attending: Emergency Medicine | Admitting: Emergency Medicine

## 2018-09-18 ENCOUNTER — Encounter (HOSPITAL_COMMUNITY): Payer: Self-pay

## 2018-09-18 ENCOUNTER — Other Ambulatory Visit: Payer: Self-pay

## 2018-09-18 ENCOUNTER — Emergency Department (HOSPITAL_COMMUNITY): Payer: Self-pay

## 2018-09-18 DIAGNOSIS — B9789 Other viral agents as the cause of diseases classified elsewhere: Secondary | ICD-10-CM

## 2018-09-18 DIAGNOSIS — J069 Acute upper respiratory infection, unspecified: Secondary | ICD-10-CM

## 2018-09-18 DIAGNOSIS — Z79899 Other long term (current) drug therapy: Secondary | ICD-10-CM | POA: Insufficient documentation

## 2018-09-18 LAB — GROUP A STREP BY PCR: Group A Strep by PCR: NOT DETECTED

## 2018-09-18 MED ORDER — BENZONATATE 100 MG PO CAPS: 100.0000 mg | ORAL_CAPSULE | Freq: Three times a day (TID) | ORAL | 0 refills | Status: DC

## 2018-09-18 NOTE — ED Triage Notes (Signed)
Pt reports URI symptoms since Saturday, productive cough, body aches. No fever. Vitals stable, no distress.

## 2018-09-18 NOTE — ED Notes (Signed)
Patient verbalized understanding of dc instructions, vss, ambulatory with nad.   

## 2018-09-18 NOTE — ED Provider Notes (Signed)
[facility] EMERGENCY DEPARTMENT Provider Note   CSN: [id number] Arrival date & time: 09/18/18  0500   History   Chief Complaint Chief Complaint  Patient presents with  . URI    HPI Wanda Crane is a 28 y.o. female.  HPI    28 year old female presents today with complaints of upper respiratory infection.  Patient notes symptoms started 4 days ago with runny nose nasal congestion and cough.  Patient denies any shortness of breath.  She denies any fever.,  She notes her boyfriend is sick with similar symptoms.  She denies any significant past medical history, non-smoker.  Did receive the flu vaccine this year.    Past Medical History:  Diagnosis Date  . Chronic right-sided low back pain with right-sided sciatica 04/13/2017  . Hemorrhagic cyst of ovary   . Spondylolisthesis, grade 1 11/27/2016    Patient Active Problem List   Diagnosis Date Noted  . Chronic right-sided low back pain with right-sided sciatica 04/13/2017  . Spondylolisthesis, grade 1 11/27/2016    History reviewed. No pertinent surgical history.   OB History    Gravida  0   Para  0   Term  0   Preterm  0   AB  0   Living  0     SAB  0   TAB  0   Ectopic  0   Multiple  0   Live Births  0            Home Medications    Prior to Admission medications   Medication Sig Start Date End Date Taking? Authorizing Provider  amoxicillin-clavulanate (AUGMENTIN) 875-125 MG tablet Take 1 tablet by mouth every 12 (twelve) hours. 07/27/18   Khatri, Hina, PA-C  benzonatate (TESSALON) 100 MG capsule Take 1 capsule (100 mg total) by mouth every 8 (eight) hours. 09/18/18   Swanson Farnell, Dellis Filbert, PA-C  DULoxetine (CYMBALTA) 60 MG capsule Take 1 capsule (60 mg total) by mouth daily. 06/24/18   Clent Demark, PA-C  gabapentin (NEURONTIN) 300 MG capsule Take 2 capsules (600 mg total) by mouth 3 (three) times daily. 06/24/18   Clent Demark, PA-C  ibuprofen (ADVIL,MOTRIN) 600 MG tablet  Take 1 tablet (600 mg total) by mouth every 6 (six) hours as needed. 08/12/18   Wieters, Hallie C, PA-C  meloxicam (MOBIC) 15 MG tablet Take 1 tablet (15 mg total) by mouth daily. 06/24/18   Clent Demark, PA-C  naproxen (NAPROSYN) 500 MG tablet Take 1 tablet (500 mg total) by mouth 2 (two) times daily. 07/27/18   Khatri, Hina, PA-C  SUMAtriptan (IMITREX) 25 MG tablet Take 1 tablet (25 mg total) by mouth daily. May take one more tablet two hours after the first. No more than two tablets per day. 06/24/18   Clent Demark, PA-C  traMADol (ULTRAM) 50 MG tablet TAKE 1 TABLET (50 MG TOTAL) BY MOUTH 2 (TWO) TIMES DAILY. 07/29/18   Kirsteins, Luanna Salk, MD    Family History Family History  Problem Relation Age of Onset  . Asthma Mother   . COPD Mother   . Diabetes Maternal Grandmother   . Heart disease Maternal Grandmother   . Stroke Maternal Grandmother   . Diabetes Maternal Grandfather   . Heart disease Maternal Grandfather   . Stroke Maternal Grandfather     Social History Social History   Tobacco Use  . Smoking status: Never Smoker  . Smokeless tobacco: Never Used  Substance Use Topics  .  Alcohol use: No  . Drug use: No     Allergies   Patient has no known allergies.   Review of Systems Review of Systems  All other systems reviewed and are negative.    Physical Exam Updated Vital Signs BP 111/62 (BP Location: Right Arm)   Pulse 89   Temp 98.2 F (36.8 C) (Oral)   Resp 18   LMP 08/19/2018   SpO2 100%   Physical Exam Vitals signs and nursing note reviewed.  Constitutional:      Appearance: She is well-developed.  HENT:     Head: Normocephalic and atraumatic.     Comments: Bilateral TMs normal, oropharynx without erythema exudate or swelling Eyes:     General: No scleral icterus.       Right eye: No discharge.        Left eye: No discharge.     Conjunctiva/sclera: Conjunctivae normal.     Pupils: Pupils are equal, round, and reactive to light.  Neck:       Musculoskeletal: Normal range of motion.     Vascular: No JVD.     Trachea: No tracheal deviation.  Pulmonary:     Effort: Pulmonary effort is normal. No respiratory distress.     Breath sounds: No stridor. No wheezing, rhonchi or rales.  Chest:     Chest wall: No tenderness.  Neurological:     Mental Status: She is alert and oriented to person, place, and time.     Coordination: Coordination normal.  Psychiatric:        Behavior: Behavior normal.        Thought Content: Thought content normal.        Judgment: Judgment normal.      ED Treatments / Results  Labs (all labs ordered are listed, but only abnormal results are displayed) Labs Reviewed  GROUP A STREP BY PCR    EKG None  Radiology Dg Chest 2 View  Result Date: 09/18/2018 CLINICAL DATA:  Cough EXAM: CHEST - 2 VIEW COMPARISON:  05/25/2014 FINDINGS: Normal heart size and mediastinal contours. No acute infiltrate or edema. No effusion or pneumothorax. No acute osseous findings. IMPRESSION: Negative chest. Electronically Signed   By: Monte Fantasia M.D.   On: 09/18/2018 05:47    Procedures Procedures (including critical care time)  Medications Ordered in ED Medications - No data to display   Initial Impression / Assessment and Plan / ED Course  I have reviewed the triage vital signs and the nursing notes.  Pertinent labs & imaging results that were available during my care of the patient were reviewed by me and considered in my medical decision making (see chart for details).     Very well-appearing 28 year old female presents today with complaints of upper respiratory infection.  She has no objective findings on my exam today, she had a chest x-ray and strep swab performed by nursing staff prior to my evaluation.  Patient discharged with symptomatic care and strict return precautions.  She verbalized understanding and agreement to today's plan.  Final Clinical Impressions(s) / ED Diagnoses   Final  diagnoses:  Viral URI with cough    ED Discharge Orders         Ordered    benzonatate (TESSALON) 100 MG capsule  Every 8 hours     09/18/18 0751           Okey Regal, PA-C 09/18/18 0800    Isla Pence, MD 09/18/18 [phone]

## 2018-09-18 NOTE — Discharge Instructions (Addendum)
Please read attached information. If you experience any new or worsening signs or symptoms please return to the emergency room for evaluation. Please follow-up with your primary care provider or specialist as discussed. Please use medication prescribed only as directed and discontinue taking if you have any concerning signs or symptoms.   °

## 2018-09-20 ENCOUNTER — Encounter (HOSPITAL_COMMUNITY): Payer: Self-pay | Admitting: Emergency Medicine

## 2018-09-20 ENCOUNTER — Emergency Department (HOSPITAL_COMMUNITY)
Admission: EM | Admit: 2018-09-20 | Discharge: 2018-09-20 | Disposition: A | Discharge status: Home / Self Care | Payer: Self-pay | Attending: Emergency Medicine | Admitting: Emergency Medicine

## 2018-09-20 DIAGNOSIS — H66001 Acute suppurative otitis media without spontaneous rupture of ear drum, right ear: Secondary | ICD-10-CM | POA: Insufficient documentation

## 2018-09-20 DIAGNOSIS — H9203 Otalgia, bilateral: Secondary | ICD-10-CM

## 2018-09-20 DIAGNOSIS — Z79899 Other long term (current) drug therapy: Secondary | ICD-10-CM | POA: Insufficient documentation

## 2018-09-20 MED ORDER — AMOXICILLIN-POT CLAVULANATE 875-125 MG PO TABS: 1.0000 | ORAL_TABLET | Freq: Two times a day (BID) | ORAL | 0 refills | Status: DC

## 2018-09-20 NOTE — Discharge Instructions (Addendum)
You were seen in the ER for ear pain.  There is some redness to both of your ears, worse on the right side.  This is probably an early ear infection.  Take antibiotics as prescribed.  Take 500 to 1000 mg of acetaminophen every 6-8 hours for pain for the next couple of days to treat the pain.  Use intranasal spray with steroid (fluticasone) to help with congestion, runny nose, inflammation of sinuses.   Return for worsening pain despite antibiotic, swelling behind your ear, leaking fluid from ear.

## 2018-09-20 NOTE — ED Triage Notes (Signed)
Pt back today with pain to both ears , pt was told that she had a URI 2 days ago

## 2018-09-20 NOTE — ED Notes (Signed)
Patient verbalized understanding of discharge instructions and denies any further needs or questions at this time. VS stable. Patient ambulatory with steady gait.  

## 2018-09-20 NOTE — ED Provider Notes (Signed)
[facility] EMERGENCY DEPARTMENT Provider Note   CSN: [id number] Arrival date & time: 09/20/18  5852     History   Chief Complaint No chief complaint on file.   HPI Wanda Crane is a 28 y.o. female is here for evaluation of bilateral ear pain.  Sudden onset last night.  The pain is intermittent, dull.  There is no radiation.  She was seen in the ER 2 days ago for upper respiratory infection.  She reports associated nasal congestion, runny nose, cough.  Denies any leaking from the ear, swelling.  HPI  Past Medical History:  Diagnosis Date  . Chronic right-sided low back pain with right-sided sciatica 04/13/2017  . Hemorrhagic cyst of ovary   . Spondylolisthesis, grade 1 11/27/2016    Patient Active Problem List   Diagnosis Date Noted  . Chronic right-sided low back pain with right-sided sciatica 04/13/2017  . Spondylolisthesis, grade 1 11/27/2016    History reviewed. No pertinent surgical history.   OB History    Gravida  0   Para  0   Term  0   Preterm  0   AB  0   Living  0     SAB  0   TAB  0   Ectopic  0   Multiple  0   Live Births  0            Home Medications    Prior to Admission medications   Medication Sig Start Date End Date Taking? Authorizing Provider  amoxicillin-clavulanate (AUGMENTIN) 875-125 MG tablet Take 1 tablet by mouth every 12 (twelve) hours. 09/20/18   Kinnie Feil, PA-C  benzonatate (TESSALON) 100 MG capsule Take 1 capsule (100 mg total) by mouth every 8 (eight) hours. 09/18/18   Hedges, Dellis Filbert, PA-C  DULoxetine (CYMBALTA) 60 MG capsule Take 1 capsule (60 mg total) by mouth daily. 06/24/18   Clent Demark, PA-C  gabapentin (NEURONTIN) 300 MG capsule Take 2 capsules (600 mg total) by mouth 3 (three) times daily. 06/24/18   Clent Demark, PA-C  ibuprofen (ADVIL,MOTRIN) 600 MG tablet Take 1 tablet (600 mg total) by mouth every 6 (six) hours as needed. 08/12/18   Wieters, Hallie C, PA-C    meloxicam (MOBIC) 15 MG tablet Take 1 tablet (15 mg total) by mouth daily. 06/24/18   Clent Demark, PA-C  naproxen (NAPROSYN) 500 MG tablet Take 1 tablet (500 mg total) by mouth 2 (two) times daily. 07/27/18   Khatri, Hina, PA-C  SUMAtriptan (IMITREX) 25 MG tablet Take 1 tablet (25 mg total) by mouth daily. May take one more tablet two hours after the first. No more than two tablets per day. 06/24/18   Clent Demark, PA-C  traMADol (ULTRAM) 50 MG tablet TAKE 1 TABLET (50 MG TOTAL) BY MOUTH 2 (TWO) TIMES DAILY. 07/29/18   Kirsteins, Luanna Salk, MD    Family History Family History  Problem Relation Age of Onset  . Asthma Mother   . COPD Mother   . Diabetes Maternal Grandmother   . Heart disease Maternal Grandmother   . Stroke Maternal Grandmother   . Diabetes Maternal Grandfather   . Heart disease Maternal Grandfather   . Stroke Maternal Grandfather     Social History Social History   Tobacco Use  . Smoking status: Never Smoker  . Smokeless tobacco: Never Used  Substance Use Topics  . Alcohol use: No  . Drug use: No     Allergies  Patient has no known allergies.   Review of Systems Review of Systems  HENT: Positive for congestion, ear pain, postnasal drip and rhinorrhea.   Respiratory: Positive for cough.   All other systems reviewed and are negative.    Physical Exam Updated Vital Signs BP 111/79 (BP Location: Right Arm)   Pulse 77   Temp 98.3 F (36.8 C) (Oral)   Resp 16   Ht 5\' 3"  (1.6 m)   Wt 90.7 kg   LMP 08/20/2018 (Exact Date)   SpO2 100%   BMI 35.43 kg/m   Physical Exam Constitutional:      Appearance: She is well-developed. She is not toxic-appearing.  HENT:     Head: Normocephalic.     Right Ear: External ear normal. Tympanic membrane is erythematous.     Left Ear: External ear normal. Tympanic membrane is erythematous.     Ears:     Comments: Top half of TM are markedly erythematous bilaterally R slight worse than L. Bottom of TMs  are clear and able to visualize bony land marks. No perforation.  External ears normal. No mastoid edema or tenderness.     Nose: Nose normal.  Eyes:     Conjunctiva/sclera: Conjunctivae normal.  Neck:     Musculoskeletal: Full passive range of motion without pain.  Cardiovascular:     Rate and Rhythm: Normal rate.  Pulmonary:     Effort: Pulmonary effort is normal. No tachypnea or respiratory distress.  Musculoskeletal: Normal range of motion.  Skin:    General: Skin is warm and dry.     Capillary Refill: Capillary refill takes less than 2 seconds.  Neurological:     Mental Status: She is alert and oriented to person, place, and time.  Psychiatric:        Behavior: Behavior normal.        Thought Content: Thought content normal.      ED Treatments / Results  Labs (all labs ordered are listed, but only abnormal results are displayed) Labs Reviewed - No data to display  EKG None  Radiology No results found.  Procedures Procedures (including critical care time)  Medications Ordered in ED Medications - No data to display   Initial Impression / Assessment and Plan / ED Course  I have reviewed the triage vital signs and the nursing notes.  Pertinent labs & imaging results that were available during my care of the patient were reviewed by me and considered in my medical decision making (see chart for details).     28 year old with bilateral otalgia in the setting of URI/nasal congestion.  She has erythematous TMs bilaterally, worse on the right.  This could be related to her congestion and frequent coughing or early otitis media.  There is no perforation.  No signs of otitis externa or mastoiditis.  Vitals are normal.  I will discharge with high-dose NSAIDs for pain, intranasal steroid spray and Augmentin for otitis media.  Return precautions were discussed.  Patient is in agreement with this.  Final Clinical Impressions(s) / ED Diagnoses   Final diagnoses:    Non-recurrent acute suppurative otitis media of right ear without spontaneous rupture of tympanic membrane  Otalgia of both ears    ED Discharge Orders         Ordered    amoxicillin-clavulanate (AUGMENTIN) 875-125 MG tablet  Every 12 hours     09/20/18 [place], Califon J, PA-C 09/20/18 [phone]  Ripley Fraise, MD 09/20/18 [phone]

## 2018-09-20 NOTE — ED Notes (Signed)
Pt given coca cola per Brooke(RN)

## 2018-09-30 MED FILL — traMADol HCL 50 MG TABS: 50 | 30 days supply | Qty: 60 | Fill #2

## 2018-10-25 MED FILL — traMADol HCL 50 MG TABS: 50 | 30 days supply | Qty: 60 | Fill #3

## 2018-12-02 ENCOUNTER — Other Ambulatory Visit: Payer: Self-pay | Admitting: *Deleted

## 2018-12-02 DIAGNOSIS — M4310 Spondylolisthesis, site unspecified: Secondary | ICD-10-CM

## 2018-12-02 MED ORDER — TRAMADOL HCL 50 MG PO TABS: 50.0000 mg | ORAL_TABLET | Freq: Two times a day (BID) | ORAL | 0 refills | Status: DC

## 2018-12-02 MED FILL — traMADol HCL 50 MG TABS: 50 | 30 days supply | Qty: 60 | Fill #0

## 2018-12-31 ENCOUNTER — Other Ambulatory Visit: Payer: Self-pay | Admitting: Physical Medicine & Rehabilitation

## 2018-12-31 ENCOUNTER — Telehealth: Payer: Self-pay | Admitting: *Deleted

## 2018-12-31 DIAGNOSIS — M4310 Spondylolisthesis, site unspecified: Secondary | ICD-10-CM

## 2018-12-31 NOTE — Telephone Encounter (Signed)
Needs appt for refill, may see eunice, will need UDS

## 2018-12-31 NOTE — Telephone Encounter (Signed)
Wanda Crane called requesting refill on her Tramadol. She has not been seen since November and canceled her December and January appts. Please advise.

## 2019-01-01 NOTE — Telephone Encounter (Signed)
Ms Amsden called back and was offered appt.  She is self pay. She was directed to [facility] to get her orange card. We cannot refill with out appt.  She will call back when she is ready.

## 2019-01-07 ENCOUNTER — Ambulatory Visit: Payer: Self-pay | Admitting: Physical Medicine & Rehabilitation

## 2019-03-10 ENCOUNTER — Other Ambulatory Visit: Payer: Self-pay

## 2019-03-10 ENCOUNTER — Encounter (HOSPITAL_BASED_OUTPATIENT_CLINIC_OR_DEPARTMENT_OTHER): Payer: Self-pay

## 2019-03-10 ENCOUNTER — Emergency Department (HOSPITAL_BASED_OUTPATIENT_CLINIC_OR_DEPARTMENT_OTHER)
Admission: EM | Admit: 2019-03-10 | Discharge: 2019-03-10 | Disposition: A | Discharge status: Home / Self Care | Payer: Self-pay | Attending: Emergency Medicine | Admitting: Emergency Medicine

## 2019-03-10 DIAGNOSIS — Z79899 Other long term (current) drug therapy: Secondary | ICD-10-CM | POA: Insufficient documentation

## 2019-03-10 DIAGNOSIS — J069 Acute upper respiratory infection, unspecified: Secondary | ICD-10-CM | POA: Insufficient documentation

## 2019-03-10 DIAGNOSIS — Z20828 Contact with and (suspected) exposure to other viral communicable diseases: Secondary | ICD-10-CM | POA: Insufficient documentation

## 2019-03-10 DIAGNOSIS — B9789 Other viral agents as the cause of diseases classified elsewhere: Secondary | ICD-10-CM | POA: Insufficient documentation

## 2019-03-10 DIAGNOSIS — R0602 Shortness of breath: Secondary | ICD-10-CM | POA: Insufficient documentation

## 2019-03-10 DIAGNOSIS — R1110 Vomiting, unspecified: Secondary | ICD-10-CM | POA: Insufficient documentation

## 2019-03-10 IMAGING — CR DG LUMBAR SPINE COMPLETE 4+V
5 series · 5 of 5 positions shown · non-contrast
Comparison: 08/14/2014

CLINICAL DATA: Chronic low back pain

EXAM:
LUMBAR SPINE - COMPLETE 4+ VIEW

[l-spine ap]
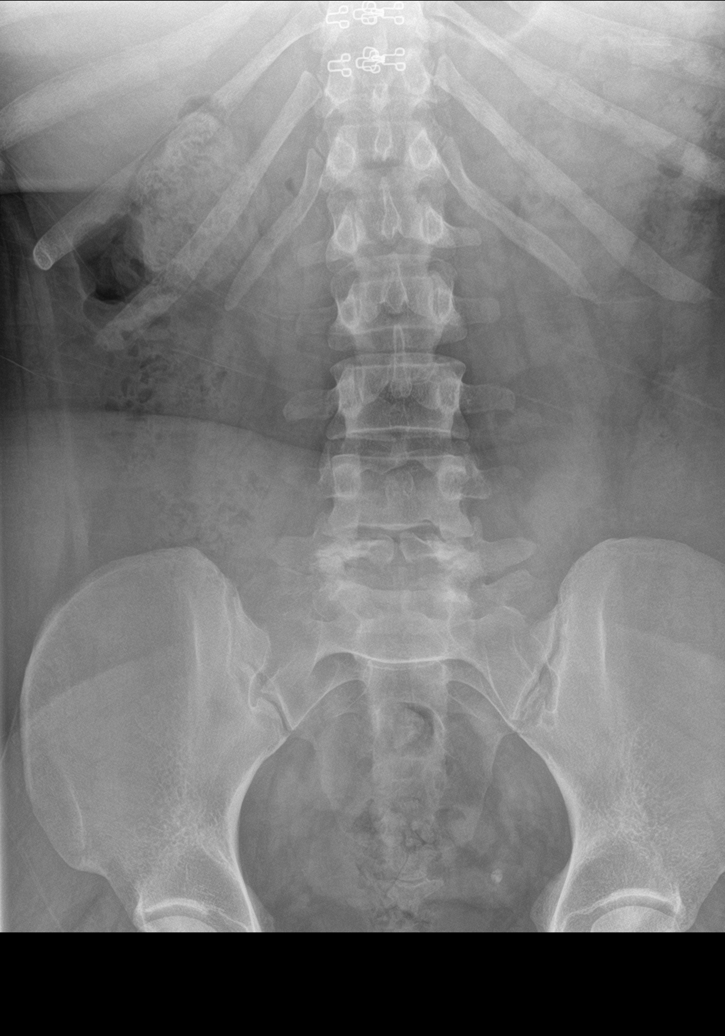

[l-spine obl (1 of 2)]
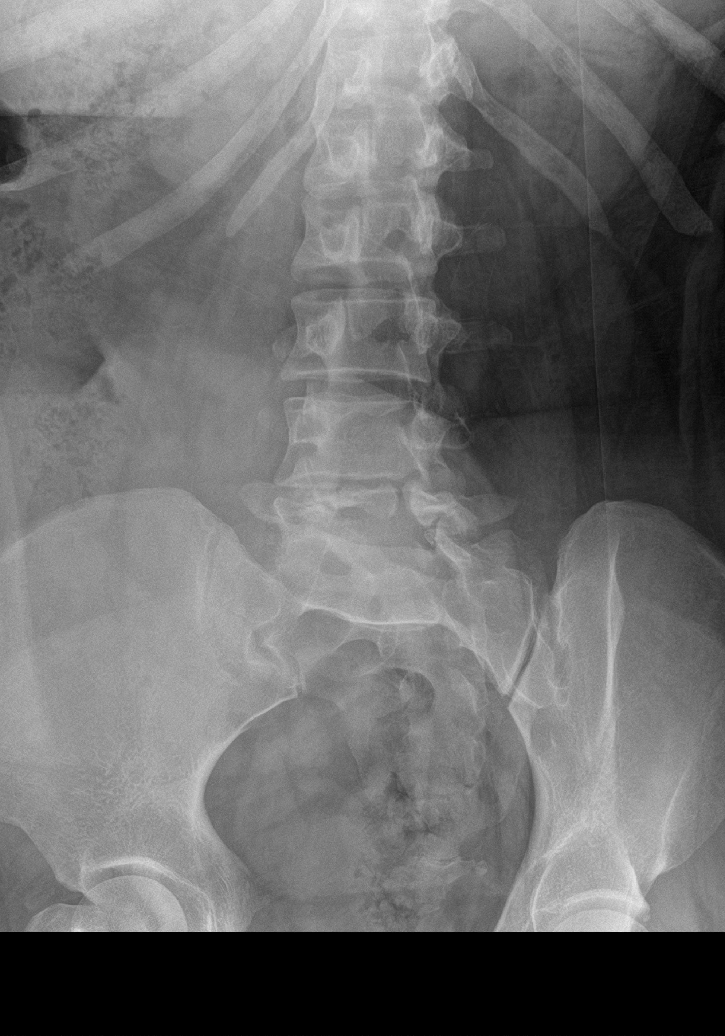

[l-spine obl (2 of 2)]
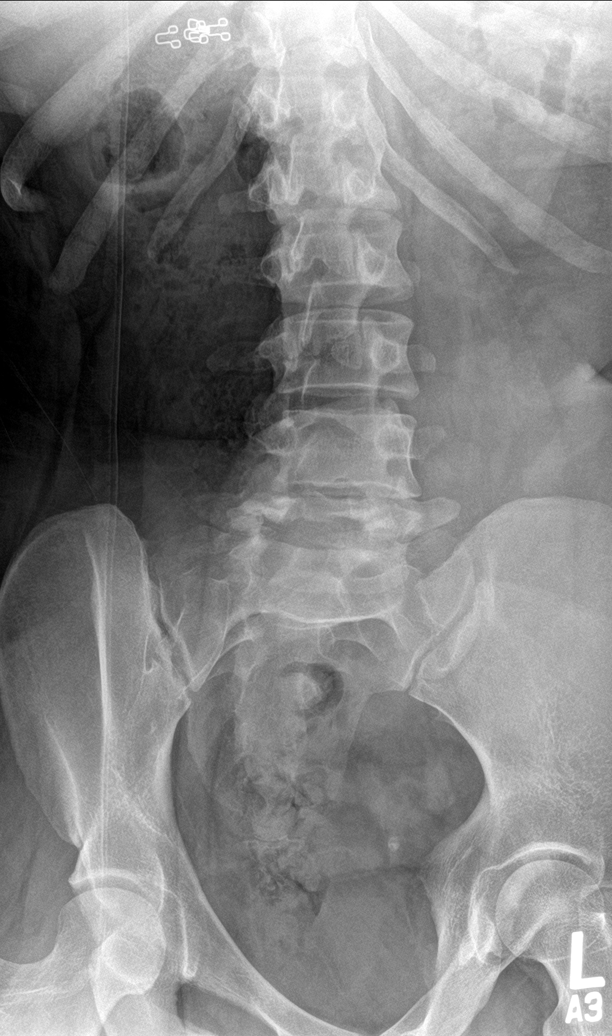

[l-spine lat]
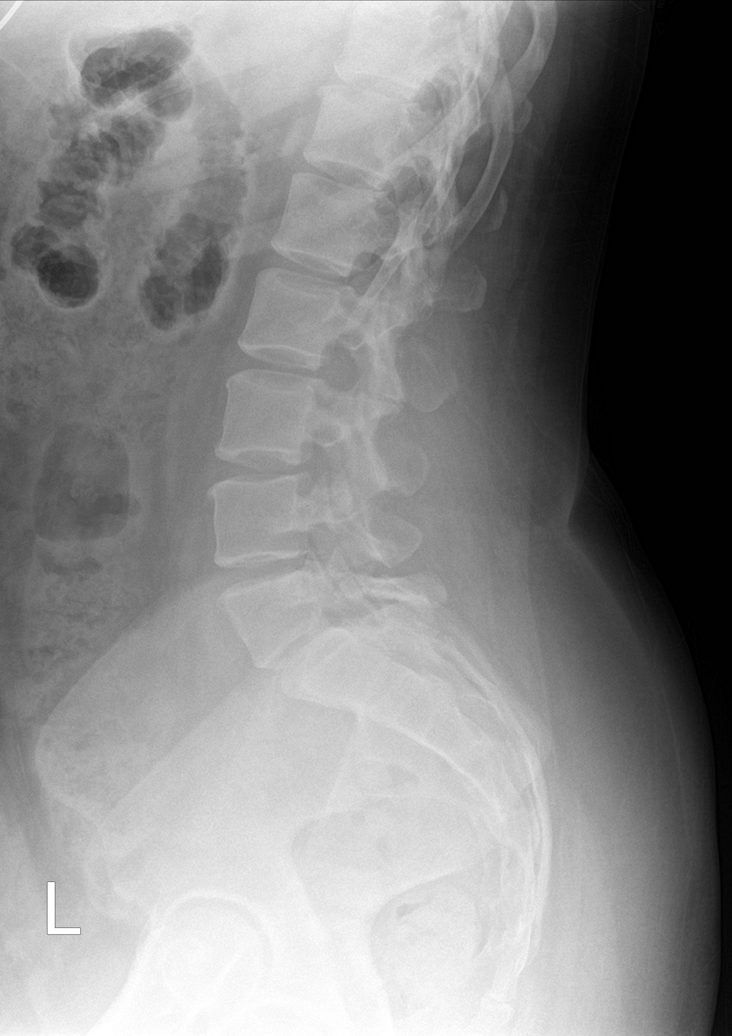

[l-spine spot]
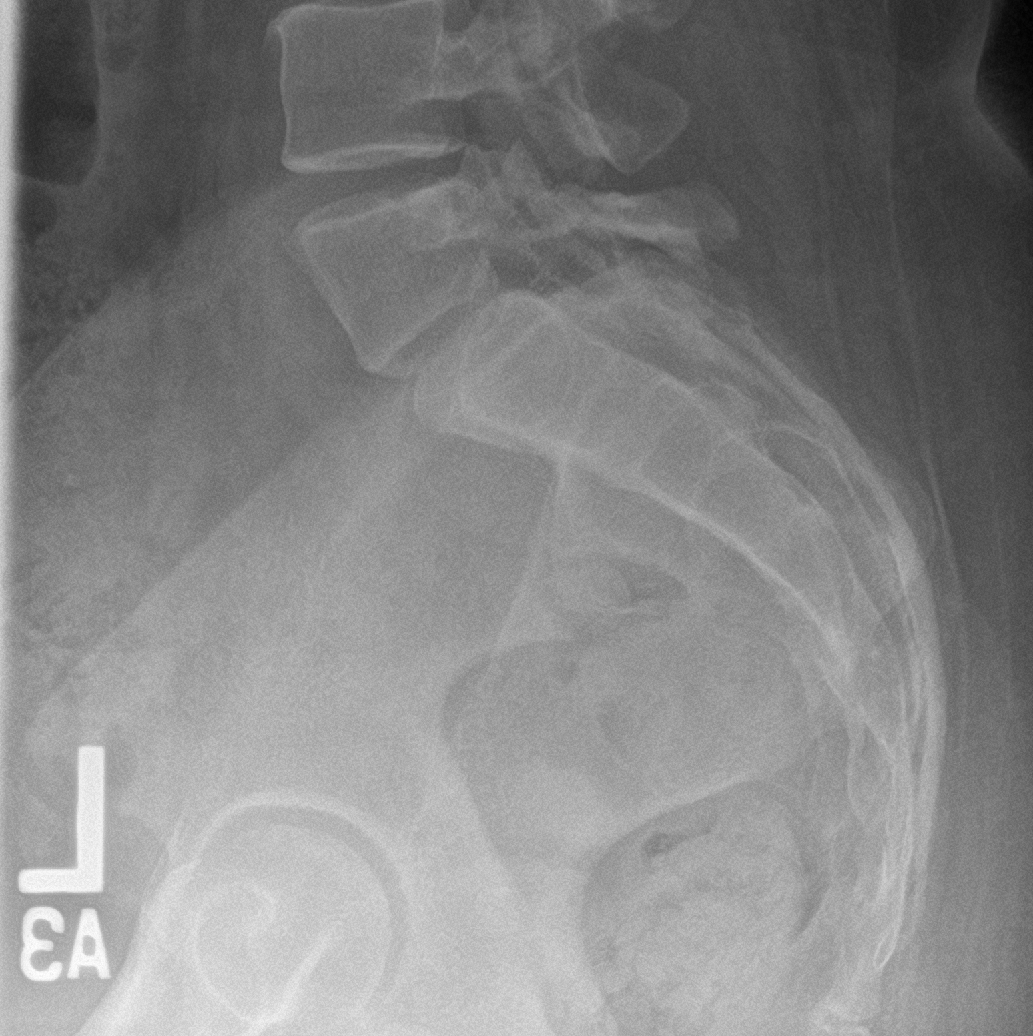

[5 of 5 positions shown; findings below may reference images not displayed]

FINDINGS: There are 5 nonrib bearing lumbar-type vertebral bodies.

The vertebral body heights are maintained.

There is minimal grade 1 anterolisthesis of L5 on S1 secondary to
bilateral chronic L5 pars interarticularis defects. Incidental note
made of spina bifida occulta at L5.

There is no acute fracture.

There is mild degenerative disc disease with disc height loss at
L5-S1.

The SI joints are unremarkable.
IMPRESSION: Stable grade 1 anterolisthesis of L5 on S1 secondary to bilateral L5
pars interarticularis defects.

## 2019-03-10 MED ORDER — BENZONATATE 100 MG PO CAPS: 100.0000 mg | ORAL_CAPSULE | Freq: Three times a day (TID) | ORAL | 0 refills | Status: DC

## 2019-03-10 MED ORDER — PREDNISONE 10 MG PO TABS: 40.0000 mg | ORAL_TABLET | Freq: Every day | ORAL | 0 refills | Status: DC

## 2019-03-10 MED ORDER — PREDNISONE 20 MG PO TABS
40.0000 mg | ORAL_TABLET | Freq: Once | ORAL | Status: AC
  Administered 2019-03-10: 15:00:00 40 mg via ORAL
  Filled 2019-03-10: qty 2

## 2019-03-10 MED ORDER — ALBUTEROL SULFATE HFA 108 (90 BASE) MCG/ACT IN AERS
4.0000 | INHALATION_SPRAY | Freq: Once | RESPIRATORY_TRACT | Status: AC
  Administered 2019-03-10: 15:00:00 4 via RESPIRATORY_TRACT
  Filled 2019-03-10: qty 6.7

## 2019-03-10 MED ORDER — ALBUTEROL SULFATE HFA 108 (90 BASE) MCG/ACT IN AERS: 1.0000 | INHALATION_SPRAY | Freq: Four times a day (QID) | RESPIRATORY_TRACT | 0 refills | Status: DC | PRN

## 2019-03-10 MED FILL — BENZONATATE 100 MG CAPS: 100 | 5 days supply | Qty: 15 | Fill #0

## 2019-03-10 MED FILL — predniSONE 10 MG TABS: 10 | 4 days supply | Qty: 16 | Fill #0

## 2019-03-10 NOTE — ED Provider Notes (Signed)
[facility] EMERGENCY DEPARTMENT Provider Note   CSN: [id number] Arrival date & time: 03/10/19  1420     History   Chief Complaint Chief Complaint  Patient presents with  . Cough    HPI Wanda Crane is a 28 y.o. female.     The history is provided by the patient and medical records. No language interpreter was used.  Cough  Wanda Crane is a 28 y.o. female who presents to the Emergency Department complaining of cough. She presents to the emergency department complaining of and shortness of breath that began about two days ago. Her cough is both mixed with white and dry sputum. She did have one episode of emesis today. No known sick contacts. She has been using her mom's albuterol with mild improvement in her symptoms. She had a similar episode back in February with cough and wheeze. She denies any fevers, chest pain, leg swelling or pain. She denies any medical problems and takes no medications. She denies any chance of pregnancy. She is currently not working. Past Medical History:  Diagnosis Date  . Chronic right-sided low back pain with right-sided sciatica 04/13/2017  . Hemorrhagic cyst of ovary   . Spondylolisthesis, grade 1 11/27/2016    Patient Active Problem List   Diagnosis Date Noted  . Chronic right-sided low back pain with right-sided sciatica 04/13/2017  . Spondylolisthesis, grade 1 11/27/2016    History reviewed. No pertinent surgical history.   OB History    Gravida  0   Para  0   Term  0   Preterm  0   AB  0   Living  0     SAB  0   TAB  0   Ectopic  0   Multiple  0   Live Births  0            Home Medications    Prior to Admission medications   Medication Sig Start Date End Date Taking? Authorizing Provider  DULoxetine (CYMBALTA) 60 MG capsule Take 1 capsule (60 mg total) by mouth daily. 06/24/18   Clent Demark, PA-C  gabapentin (NEURONTIN) 300 MG capsule Take 2 capsules (600 mg total) by mouth 3 (three) times  daily. 06/24/18   Clent Demark, PA-C  ibuprofen (ADVIL,MOTRIN) 600 MG tablet Take 1 tablet (600 mg total) by mouth every 6 (six) hours as needed. 08/12/18   Wieters, Hallie C, PA-C  meloxicam (MOBIC) 15 MG tablet Take 1 tablet (15 mg total) by mouth daily. 06/24/18   Clent Demark, PA-C  naproxen (NAPROSYN) 500 MG tablet Take 1 tablet (500 mg total) by mouth 2 (two) times daily. 07/27/18   Khatri, Hina, PA-C  SUMAtriptan (IMITREX) 25 MG tablet Take 1 tablet (25 mg total) by mouth daily. May take one more tablet two hours after the first. No more than two tablets per day. 06/24/18   Clent Demark, PA-C  traMADol (ULTRAM) 50 MG tablet Take 1 tablet (50 mg total) by mouth 2 (two) times daily. 12/02/18   Kirsteins, Luanna Salk, MD    Family History Family History  Problem Relation Age of Onset  . Asthma Mother   . COPD Mother   . Diabetes Maternal Grandmother   . Heart disease Maternal Grandmother   . Stroke Maternal Grandmother   . Diabetes Maternal Grandfather   . Heart disease Maternal Grandfather   . Stroke Maternal Grandfather     Social History Social History   Tobacco Use  .  Smoking status: Never Smoker  . Smokeless tobacco: Never Used  Substance Use Topics  . Alcohol use: No  . Drug use: No     Allergies   Patient has no known allergies.   Review of Systems Review of Systems  Respiratory: Positive for cough.   All other systems reviewed and are negative.    Physical Exam Updated Vital Signs BP 100/72 (BP Location: Left Arm)   Pulse 89   Temp 98.6 F (37 C) (Oral)   Resp 16   Ht 5\' 3"  (1.6 m)   Wt 93.9 kg   LMP 03/07/2019   SpO2 96%   BMI 36.67 kg/m   Physical Exam Vitals signs and nursing note reviewed.  Constitutional:      Appearance: She is well-developed.  HENT:     Head: Normocephalic and atraumatic.  Cardiovascular:     Rate and Rhythm: Normal rate and regular rhythm.     Heart sounds: No murmur.  Pulmonary:     Effort: Pulmonary  effort is normal. No respiratory distress.     Comments: Diffuse end expiratory wheezes Abdominal:     Palpations: Abdomen is soft.     Tenderness: There is no abdominal tenderness. There is no guarding or rebound.  Musculoskeletal:        General: No swelling or tenderness.  Skin:    General: Skin is warm and dry.  Neurological:     Mental Status: She is alert and oriented to person, place, and time.  Psychiatric:        Mood and Affect: Mood normal.        Behavior: Behavior normal.      ED Treatments / Results  Labs (all labs ordered are listed, but only abnormal results are displayed) Labs Reviewed  NOVEL CORONAVIRUS, NAA (HOSPITAL ORDER, SEND-OUT TO REF LAB)    EKG None  Radiology No results found.  Procedures Procedures (including critical care time)  Medications Ordered in ED Medications  albuterol (VENTOLIN HFA) 108 (90 Base) MCG/ACT inhaler 4 puff (has no administration in time range)  predniSONE (DELTASONE) tablet 40 mg (has no administration in time range)     Initial Impression / Assessment and Plan / ED Course  I have reviewed the triage vital signs and the nursing notes.  Pertinent labs & imaging results that were available during my care of the patient were reviewed by me and considered in my medical decision making (see chart for details).        Patient here for evaluation of cough, wheeze and shortness of breath. She does have wheezing on examination without respiratory distress. She has experienced similar episodes back earlier this year that responded to albuterol use. She does not currently have a history of reactive airway disease. Current presentation is not consistent with pneumonia, CHF, PE. Discussed with patient home care for cough and wheezing, possible reactive airway. Discussed continuing albuterol, PRN as well as prednisone. Discussed outpatient follow-up as well as return precautions. Patient without known coronavirus exposures but in  setting of new onset shortness of breath with cough and wheeze, will send outpatient study.  Wanda Crane was evaluated in Emergency Department on 03/10/2019 for the symptoms described in the history of present illness. She was evaluated in the context of the global COVID-19 pandemic, which necessitated consideration that the patient might be at risk for infection with the SARS-CoV-2 virus that causes COVID-19. Institutional protocols and algorithms that pertain to the evaluation of patients at risk for COVID-19  are in a state of rapid change based on information released by regulatory bodies including the CDC and federal and state organizations. These policies and algorithms were followed during the patient's care in the ED.   Final Clinical Impressions(s) / ED Diagnoses   Final diagnoses:  Viral URI with cough    ED Discharge Orders    None       Quintella Reichert, MD 03/10/19 1521

## 2019-03-10 NOTE — ED Triage Notes (Signed)
pt c/o flu like sx day 2-NAD-steady gait

## 2019-03-11 LAB — NOVEL CORONAVIRUS, NAA (HOSP ORDER, SEND-OUT TO REF LAB; TAT 18-24 HRS): SARS-CoV-2, NAA: NOT DETECTED

## 2019-04-27 ENCOUNTER — Ambulatory Visit (HOSPITAL_COMMUNITY)
Admission: EM | Admit: 2019-04-27 | Discharge: 2019-04-27 | Disposition: A | Discharge status: Home / Self Care | Payer: Self-pay | Attending: Family Medicine | Admitting: Family Medicine

## 2019-04-27 ENCOUNTER — Other Ambulatory Visit: Payer: Self-pay

## 2019-04-27 ENCOUNTER — Encounter (HOSPITAL_COMMUNITY): Payer: Self-pay | Admitting: Family Medicine

## 2019-04-27 DIAGNOSIS — M79672 Pain in left foot: Secondary | ICD-10-CM

## 2019-04-27 MED ORDER — PREDNISONE 20 MG PO TABS: ORAL_TABLET | ORAL | 0 refills | Status: DC

## 2019-04-27 NOTE — Discharge Instructions (Addendum)
The only condition that would account for your pain is inflammation in some of the tendons.  Therefore I am giving you an anti-inflammatory that should help with pain.  You can call the hospital main number to ask for social work person to help you find an orange card.  You could also try calling city of [place] for help, or go to [facility].

## 2019-04-27 NOTE — ED Triage Notes (Signed)
Reports left foot injury at age 28 "but I never got it checked out"; since then has had intermittent left foot pain.  This episode left foot pain started approx 2 days ago.  Denies any new injury.

## 2019-04-27 NOTE — ED Provider Notes (Signed)
[place]    CSN: [id number] Arrival date & time: 04/27/19  1149      History   Chief Complaint Chief Complaint  Patient presents with  . Foot Pain    HPI Wanda Crane is a 28 y.o. female.   Established [facility] patient with left foot pain, chronic.  Reports left foot injury at age 71, but I never got it checked out; since then has had intermittent left foot pain.  This episode left foot pain started approx 2 days ago.  Denies any new injury.  Patient wants more pain medicine for her back.  She says Dr. Read Drivers won't refill her Tramadol  Patient lives with her boyfriend.  She says she cannot work because she has spondylolisthesis.     Past Medical History:  Diagnosis Date  . Chronic right-sided low back pain with right-sided sciatica 04/13/2017  . Hemorrhagic cyst of ovary   . Spondylolisthesis, grade 1 11/27/2016    Patient Active Problem List   Diagnosis Date Noted  . Chronic right-sided low back pain with right-sided sciatica 04/13/2017  . Spondylolisthesis, grade 1 11/27/2016    History reviewed. No pertinent surgical history.  OB History    Gravida  0   Para  0   Term  0   Preterm  0   AB  0   Living  0     SAB  0   TAB  0   Ectopic  0   Multiple  0   Live Births  0            Home Medications    Prior to Admission medications   Medication Sig Start Date End Date Taking? Authorizing Provider  albuterol (VENTOLIN HFA) 108 (90 Base) MCG/ACT inhaler Inhale 1-2 puffs into the lungs every 6 (six) hours as needed for wheezing or shortness of breath. 03/10/19   Quintella Reichert, MD  DULoxetine (CYMBALTA) 60 MG capsule Take 1 capsule (60 mg total) by mouth daily. 06/24/18   Clent Demark, PA-C  gabapentin (NEURONTIN) 300 MG capsule Take 2 capsules (600 mg total) by mouth 3 (three) times daily. 06/24/18   Clent Demark, PA-C  predniSONE (DELTASONE) 20 MG tablet Two daily with food 04/27/19   Robyn Haber, MD  SUMAtriptan  (IMITREX) 25 MG tablet Take 1 tablet (25 mg total) by mouth daily. May take one more tablet two hours after the first. No more than two tablets per day. 06/24/18   Clent Demark, PA-C    Family History Family History  Problem Relation Age of Onset  . Asthma Mother   . COPD Mother   . Diabetes Maternal Grandmother   . Heart disease Maternal Grandmother   . Stroke Maternal Grandmother   . Diabetes Maternal Grandfather   . Heart disease Maternal Grandfather   . Stroke Maternal Grandfather     Social History Social History   Tobacco Use  . Smoking status: Never Smoker  . Smokeless tobacco: Never Used  Substance Use Topics  . Alcohol use: No  . Drug use: No     Allergies   Patient has no known allergies.   Review of Systems Review of Systems  Musculoskeletal: Positive for back pain and gait problem.  All other systems reviewed and are negative.    Physical Exam Triage Vital Signs ED Triage Vitals  Enc Vitals Group     BP      Pulse      Resp  Temp      Temp src      SpO2      Weight      Height      Head Circumference      Peak Flow      Pain Score      Pain Loc      Pain Edu?      Excl. in [place]?    No data found.  Updated Vital Signs BP 96/62   Pulse 69   Temp 97.7 F (36.5 C) (Other (Comment))   Resp 16   LMP 04/01/2019 (Approximate)   SpO2 98%    Physical Exam Vitals signs and nursing note reviewed.  Constitutional:      Appearance: Normal appearance. She is obese.  Neck:     Musculoskeletal: Normal range of motion and neck supple.  Pulmonary:     Effort: Pulmonary effort is normal.  Musculoskeletal: Normal range of motion.        General: Signs of injury present. No swelling, tenderness or deformity.     Comments: Left foot appears completely normal in terms of appearance, range of motion, and palpation  Skin:    General: Skin is warm and dry.  Neurological:     General: No focal deficit present.     Mental Status: She is  alert and oriented to person, place, and time.  Psychiatric:        Mood and Affect: Mood normal.        Behavior: Behavior normal.      UC Treatments / Results  Labs (all labs ordered are listed, but only abnormal results are displayed) Labs Reviewed - No data to display  EKG   Radiology No results found.  Procedures Procedures (including critical care time)  Medications Ordered in UC Medications - No data to display  Initial Impression / Assessment and Plan / UC Course  I have reviewed the triage vital signs and the nursing notes.  Pertinent labs & imaging results that were available during my care of the patient were reviewed by me and considered in my medical decision making (see chart for details).    Final Clinical Impressions(s) / UC Diagnoses   Final diagnoses:  Foot pain, left     Discharge Instructions     The only condition that would account for your pain is inflammation in some of the tendons.  Therefore I am giving you an anti-inflammatory that should help with pain.  You can call the hospital main number to ask for social work person to help you find an orange card.  You could also try calling city of [place] for help, or go to [facility].    ED Prescriptions    Medication Sig Dispense Auth. Provider   predniSONE (DELTASONE) 20 MG tablet Two daily with food 10 tablet Robyn Haber, MD     I have reviewed the PDMP during this encounter.   Robyn Haber, MD 04/27/19 1242

## 2019-05-09 ENCOUNTER — Ambulatory Visit: Payer: Self-pay

## 2019-06-10 ENCOUNTER — Ambulatory Visit: Payer: Self-pay | Admitting: Physical Medicine & Rehabilitation

## 2019-06-17 ENCOUNTER — Ambulatory Visit (INDEPENDENT_AMBULATORY_CARE_PROVIDER_SITE_OTHER): Payer: Self-pay | Admitting: Primary Care

## 2019-06-17 ENCOUNTER — Other Ambulatory Visit: Payer: Self-pay

## 2019-06-17 ENCOUNTER — Encounter (INDEPENDENT_AMBULATORY_CARE_PROVIDER_SITE_OTHER): Payer: Self-pay | Admitting: Primary Care

## 2019-06-17 DIAGNOSIS — M4310 Spondylolisthesis, site unspecified: Secondary | ICD-10-CM

## 2019-06-17 DIAGNOSIS — G8929 Other chronic pain: Secondary | ICD-10-CM

## 2019-06-17 DIAGNOSIS — F418 Other specified anxiety disorders: Secondary | ICD-10-CM

## 2019-06-17 DIAGNOSIS — R519 Headache, unspecified: Secondary | ICD-10-CM

## 2019-06-17 MED ORDER — ALBUTEROL SULFATE HFA 108 (90 BASE) MCG/ACT IN AERS: 1.0000 | INHALATION_SPRAY | Freq: Four times a day (QID) | RESPIRATORY_TRACT | 1 refills | Status: DC | PRN

## 2019-06-17 MED ORDER — GABAPENTIN 300 MG PO CAPS: 600.0000 mg | ORAL_CAPSULE | Freq: Three times a day (TID) | ORAL | 0 refills | Status: DC

## 2019-06-17 MED ORDER — SUMATRIPTAN SUCCINATE 25 MG PO TABS: 25.0000 mg | ORAL_TABLET | Freq: Every day | ORAL | 3 refills | Status: DC

## 2019-06-17 MED FILL — GABAPENTIN 300 MG CAPSULE: 300 | 30 days supply | Qty: 180 | Fill #0

## 2019-06-17 MED FILL — !VENTOLIN HFA INHALER: 108 (90 BAS | 25 days supply | Qty: 18 | Fill #0

## 2019-06-17 MED FILL — SUMATRIPTAN SUCC 25 MG TAB: 25 | 10 days supply | Qty: 20 | Fill #0

## 2019-06-17 NOTE — Progress Notes (Signed)
Virtual Visit via Telephone Note  I connected with Wanda Crane on 06/17/19 at 10:50 AM EST by telephone and verified that I am speaking with the correct person using two identifiers.   I discussed the limitations, risks, security and privacy concerns of performing an evaluation and management service by telephone and the availability of in person appointments. I also discussed with the patient that there may be a patient responsible charge related to this service. The patient expressed understanding and agreed to proceed.   History of Present Illness: Wanda Crane is establishing care with new provider but is establish at [facility]. She is requesting medication refills on gabapentin but feels it is not working like it use too. Explained she is on the maximin dose 3600. PHQ score is high recommended following up with CSW. Past Medical History:  Diagnosis Date  . Chronic right-sided low back pain with right-sided sciatica 04/13/2017  . Hemorrhagic cyst of ovary   . Spondylolisthesis, grade 1 11/27/2016   Observations/Objective: Review of Systems  Musculoskeletal: Positive for back pain.  Neurological: Positive for tingling, weakness and headaches.  Psychiatric/Behavioral: Positive for depression.  All other systems reviewed and are negative.   Assessment and Plan: Wanda Crane was seen today for back pain and medication refill.  Diagnoses and all orders for this visit:  Spondylolisthesis, grade 1  History of lumbosacral spondylolisthesis L5-S1. She is followed by Charlett Blake, MD Physical Medicine and Rehabilitation.  gabapentin (NEURONTIN) 300 MG capsule; Take 2 capsules (600 mg total) by mouth 3 (three) times daily.  Chronic nonintractable headache, unspecified headache type  Discussed triggers that can be caused by drinking alcohol smoking or certain medications, eating and drinking certain products, caffeine, aged cheese, and chocolate. -     SUMAtriptan (IMITREX) 25 MG tablet; Take 1  tablet (25 mg total) by mouth daily. May take one more tablet two hours after the first. No more than two tablets per day.  Depression with anxiety PHQ 22 to follow up with CSW via tele/web visit  Other orders -     -     albuterol (VENTOLIN HFA) 108 (90 Base) MCG/ACT inhaler; Inhale 1-2 puffs into the lungs every 6 (six) hours as needed for wheezing or shortness of breath.    Follow Up Instructions:    I discussed the assessment and treatment plan with the patient. The patient was provided an opportunity to ask questions and all were answered. The patient agreed with the plan and demonstrated an understanding of the instructions.   The patient was advised to call back or seek an in-person evaluation if the symptoms worsen or if the condition fails to improve as anticipated.  I provided 20 minutes of non-face-to-face time during this encounter.   Kerin Perna, NP

## 2019-06-19 ENCOUNTER — Encounter: Payer: Self-pay | Attending: Physical Medicine & Rehabilitation | Admitting: Physical Medicine & Rehabilitation

## 2019-06-19 ENCOUNTER — Encounter: Payer: Self-pay | Admitting: Physical Medicine & Rehabilitation

## 2019-06-19 ENCOUNTER — Other Ambulatory Visit: Payer: Self-pay

## 2019-06-19 ENCOUNTER — Other Ambulatory Visit: Payer: Self-pay | Admitting: Physical Medicine & Rehabilitation

## 2019-06-19 VITALS — BP 96/65 | HR 72 | Temp 97.7°F | Ht 63.0 in | Wt 208.0 lb

## 2019-06-19 DIAGNOSIS — M533 Sacrococcygeal disorders, not elsewhere classified: Secondary | ICD-10-CM

## 2019-06-19 DIAGNOSIS — M4310 Spondylolisthesis, site unspecified: Secondary | ICD-10-CM

## 2019-06-19 MED ORDER — DICLOFENAC EPOLAMINE 1.3 % EX PTCH: 1.0000 | MEDICATED_PATCH | Freq: Two times a day (BID) | CUTANEOUS | 2 refills | Status: DC

## 2019-06-19 MED ORDER — TRAMADOL HCL 50 MG PO TABS: 50.0000 mg | ORAL_TABLET | Freq: Two times a day (BID) | ORAL | 2 refills | Status: DC

## 2019-06-19 NOTE — Patient Instructions (Signed)
Diclofenac skin patch What is this medicine? DICLOFENAC (dye KLOE fen ak) is a non-steroidal anti-inflammatory drug (NSAID). It is used to treat pain from bruises, sprains, or strains. This medicine may be used for other purposes; ask your health care provider or pharmacist if you have questions. COMMON BRAND NAME(S): Flector What should I tell my health care provider before I take this medicine? They need to know if you have any of these conditions:  asthma  bleeding problems  coronary artery bypass graft (CABG) surgery within the past 2 weeks  drink more than 3 alcohol containing drinks a day  heart disease  high blood pressure  kidney disease  liver disease  open or infected skin  stomach bleeding or ulcers  an unusual or allergic reaction to diclofenac, aspirin, other NSAIDs, other medicines, polysorbate 80, gelatin, foods, dyes, or preservatives  pregnant or trying to get pregnant  breast-feeding How should I use this medicine? This medicine is for external use only. Follow the directions on the prescription label. Use your medicine at regular intervals. Do not use it more often than directed. Wash hands before and after use. Apply the patch only to normal, intact skin. Do not apply to damaged skin such as wounds or burns. Tape the edges of the patch to your skin if the patch begins to peel off. Wear only one patch at a time. Take the patch off before bathing or showering. Bathe or shower immediately before you need to apply a new patch. Do not get the patch wet. A special MedGuide will be given to you by the pharmacist with each prescription and refill. Be sure to read this information carefully each time. Talk to your pediatrician regarding the use of this medicine in children. While this drug may be prescribed for children as young as 6 years for selected conditions, precautions do apply. Overdosage: If you think you have taken too much of this medicine contact a poison  control center or emergency room at once. NOTE: This medicine is only for you. Do not share this medicine with others. What if I miss a dose? If you miss a dose, use it as soon as you can. If it is almost time for your next dose, use only that dose. Do not use double or extra doses. What may interact with this medicine? Do not take this medicine with any of the following medications:  cidofovir  ketorolac  methotrexate This medicine may also interact with the following medications:  alcohol  aspirin and aspirin-like medicines  diuretics  lithium  medicines for blood pressure  medicines for osteoporosis  medicines that affect platelets  medicines that treat or prevent blood clots like warfarin  NSAIDs, medicines for pain and inflammation, like ibuprofen or naproxen  pemetrexed  steroid medicines like prednisone or cortisone Do not use any other skin products on the affected area without asking your doctor or health care professional. This list may not describe all possible interactions. Give your health care provider a list of all the medicines, herbs, non-prescription drugs, or dietary supplements you use. Also tell them if you smoke, drink alcohol, or use illegal drugs. Some items may interact with your medicine. What should I watch for while using this medicine? Tell your doctor or healthcare provider if your symptoms do not start to get better or if they get worse. This medicine may cause serious skin reactions. They can happen weeks to months after starting the medicine. Contact your healthcare provider right away if you  notice fevers or flu-like symptoms with a rash. The rash may be red or purple and then turn into blisters or peeling of the skin. Or, you might notice a red rash with swelling of the face, lips or lymph nodes in your neck or under your arms. Do not take medicines such as ibuprofen and naproxen with this medicine. Side effects such as stomach upset, nausea,  or ulcers may be more likely to occur. Many medicines available without a prescription should not be taken with this medicine. This medicine does not prevent heart attack or stroke. In fact, this medicine may increase the chance of a heart attack or stroke. The chance may increase with longer use of this medicine and in people who have heart disease. If you take aspirin to prevent heart attack or stroke, talk with your doctor or healthcare provider. This medicine can cause ulcers and bleeding in the stomach and intestines at any time during treatment. Do not smoke cigarettes or drink alcohol. These increase irritation to your stomach and can make it more susceptible to damage from this medicine. Ulcers and bleeding can happen without warning symptoms and can cause death. This medicine can cause you to bleed more easily. Try to avoid damage to your teeth and gums when you brush or floss your teeth. What side effects may I notice from receiving this medicine? Side effects that you should report to your doctor or health care professional as soon as possible:  allergic reactions like skin rash, itching or hives, swelling of the face, lips, or tongue  black or bloody stools, blood in the urine or vomit  breathing problems  changes in vision  chest pain  nausea or vomiting  rash, fever, and swollen lymph nodes  redness, blistering, peeling or loosening of the skin, including inside the mouth  slurred speech or weakness on one side of the body  trouble passing urine or change in the amount of urine  unexplained weight gain or swelling  unusually weak or tired  yellowing of eyes or skin Side effects that usually do not require medical attention (report to your doctor or health care professional if they continue or are bothersome):  dizziness  dry skin  headache  heartburn  increased sensitivity to the sun  stomach pain  tingling at the application site This list may not  describe all possible side effects. Call your doctor for medical advice about side effects. You may report side effects to FDA at 1-800-FDA-1088. Where should I keep my medicine? Keep out of the reach of children. Store at room temperature between 15 and 30 degrees C (59 and 86 degrees F) in the resealable envelopes. Seal the envelope after removing a patch for use. Protect from moisture. Throw away any unused medicine after the expiration date. NOTE: This sheet is a summary. It may not cover all possible information. If you have questions about this medicine, talk to your doctor, pharmacist, or health care provider.  2020 Elsevier/Gold Standard (2018-10-09 13:07:53)

## 2019-06-19 NOTE — Progress Notes (Signed)
Subjective:    Patient ID: Wanda Crane, female    DOB: 06-05-1991, 28 y.o.   MRN: [id number]  HPI  28 year old female with spina bifida occulta who has chronic right-sided low back pain.  She has undergone right-sided L3-L4 medial branch and L5 dorsal ramus injections without significant relief her pain went down from a 9/10 to a 7/10. She continues have pain in the right-sided low back and buttock region. She has no pain radiating down the right lower extremity or the left lower extremity. She continues to have chronic foot pain states she had a crush injury as a child  The patient has tried physical therapy for back pain without relief She has tried tramadol 50 mg twice daily which gives her relief.  She last filled this in April.  She has not been taking it for several months and notes some increased pain.  She would like to resume this medication.  She missed follow-up appointments last spring. Denies new medical issues since last visit 1 yr ago  Was taking gabapentin for HA per PCP Pain Inventory Average Pain 10 Pain Right Now 10 My pain is constant, sharp, burning, stabbing, tingling and aching  In the last 24 hours, has pain interfered with the following? General activity 9 Relation with others 8 Enjoyment of life 8 What TIME of day is your pain at its worst? all Sleep (in general) Poor  Pain is worse with: walking, bending, sitting, inactivity, standing, unsure and some activites Pain improves with: rest and medication Relief from Meds: 3  Mobility walk without assistance ability to climb steps?  no do you drive?  yes  Function not employed: date last employed . I need assistance with the following:  meal prep, household duties and shopping  Neuro/Psych spasms depression anxiety  Prior Studies Any changes since last visit?  no CLINICAL DATA:  Low back pain. BILATERAL leg pain. Symptoms for 7 years.  EXAM: MRI LUMBAR SPINE WITHOUT CONTRAST  TECHNIQUE:  Multiplanar, multisequence MR imaging of the lumbar spine was performed. No intravenous contrast was administered.  COMPARISON:  Lumbar spine radiographs 11/13/2016.  FINDINGS: Segmentation:  Standard.  Alignment:  3 mm grade 1 anterolisthesis L5-S1  Vertebrae: BILATERAL L5 spondylolysis. No worrisome osseous lesion. No significant bone marrow edema at L4-5 or L5-S1.  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 See series 11, image 10 for reference. The visualized thoracic cord, as well as the conus medullaris has an overall normal signal.  Paraspinal and other soft tissues: Unremarkable. Non worrisome appearing renal cystic disease.  Disc levels:  L1-L2:  Normal.  L2-L3:  Normal.  L3-L4:  Disc desiccation. Annular bulge. No impingement.  L4-L5:  Annular bulge. Facet arthropathy. No impingement.  L5-S1: 3 mm anterolisthesis secondary to BILATERAL L5 pars defects. No stenosis or subarticular zone narrowing. BILATERAL foraminal narrowing due to slip, and uncovering of the disc narrows both neural foramina, slightly worse on the RIGHT.  IMPRESSION: Grade 1 anterolisthesis L5-S1 secondary to BILATERAL L5 spondylolysis. BILATERAL foraminal narrowing potentially affects both L5 nerve roots, slightly worse on the RIGHT.  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 Neurosurgical consultation may be warranted.  IT IS IMPORTANT, IF  LUMBAR MYELOGRAPHY IS REQUESTED, THAT SPECIFIC REFERENCE TO LOW TETHERED CORD AND DIASTEMATOMYELIA IS INCLUDED IN THE CLINICAL DATA, TO AVOID INADVERTENT CORD INJURY DURING LUMBAR PUNCTURE.    Electronically Signed   By: Staci Righter M.D.   On: 06/28/2018 07:04  Physicians involved in your care Any changes since last visit?  no   Family History  Problem Relation Age of Onset  . Asthma Mother   . COPD Mother   . Diabetes Maternal Grandmother   . Heart disease Maternal Grandmother   . Stroke Maternal Grandmother   . Diabetes Maternal Grandfather   . Heart disease Maternal Grandfather   . Stroke Maternal Grandfather    Social History   Socioeconomic History  . Marital status: Single    Spouse name: Not on file  . Number of children: Not on file  . Years of education: Not on file  . Highest education level: Not on file  Occupational History  . Not on file  Social Needs  . Financial resource strain: Not on file  . Food insecurity    Worry: Not on file    Inability: Not on file  . Transportation needs    Medical: Not on file    Non-medical: Not on file  Tobacco Use  . Smoking status: Never Smoker  . Smokeless tobacco: Never Used  Substance and Sexual Activity  . Alcohol use: No  . Drug use: No  . Sexual activity: Yes    Birth control/protection: None  Lifestyle  . Physical activity    Days per week: Not on file    Minutes per session: Not on file  . Stress: Not on file  Relationships  . Social Herbalist on phone: Not on file    Gets together: Not on file    Attends religious service: Not on file    Active member of club or organization: Not on file    Attends meetings of clubs or organizations: Not on file    Relationship status: Not on file  Other Topics Concern  . Not on file  Social History Narrative  . Not on file   No past surgical history on file. Past Medical History:  Diagnosis Date  . Chronic right-sided low back pain with right-sided sciatica 04/13/2017  . Hemorrhagic cyst of ovary   . Spondylolisthesis, grade 1 11/27/2016   There were no vitals taken for this visit.  Opioid Risk Score:   Fall Risk Score:  `1   Depression screen PHQ 2/9  Depression screen [facility] 2/9 06/17/2019 06/24/2018 05/27/2018 04/03/2018 01/18/2018 11/08/2017 09/18/2017  Decreased Interest 0 3 3 3 1 2 2   Down, Depressed, Hopeless 1 3 3 3 2 2 2   PHQ - 2 Score 1 6 6 6 3 4 4   Altered sleeping - 3 3 3 3 2 3   Tired, decreased energy - 3 3 3 3 2 3   Change in appetite - 3 3 3 2 2 2   Feeling bad or failure about yourself  - 3 3 2 1 1  0  Trouble concentrating - 3 3 3 3 3 2   Moving slowly or fidgety/restless - 1 3 2 3 2 2   Suicidal thoughts - 0 0 0 0 0 0  PHQ-9 Score - 22 24 22 18 16 16   Difficult doing work/chores - - - - - - -  Some recent data might be hidden     Review of Systems  Constitutional: Positive for diaphoresis.  HENT: Negative.  Eyes: Negative.   Respiratory: Positive for cough, shortness of breath and wheezing.   Cardiovascular: Negative.   Gastrointestinal: Positive for abdominal pain.  Endocrine: Negative.   Genitourinary: Negative.   Musculoskeletal: Positive for arthralgias, back pain and myalgias.  Skin: Negative.   Allergic/Immunologic: Negative.   Neurological: Negative.   Hematological: Negative.   Psychiatric/Behavioral: Positive for dysphoric mood. The patient is nervous/anxious.   All other systems reviewed and are negative.      Objective:   Physical Exam Vitals signs and nursing note reviewed.  Constitutional:      Appearance: Normal appearance.  HENT:     Head: Normocephalic and atraumatic.  Eyes:     Conjunctiva/sclera: Conjunctivae normal.     Pupils: Pupils are equal, round, and reactive to light.  Musculoskeletal:     Comments: Mild tenderness to palpation over the right PSIS area. Lumbar range of motion is approximately 75% flexion extension 50% lateral bending.  She has pain with all movements. Negative straight leg raising test  Neurological:     General: No focal deficit present.     Mental Status: She is alert and oriented to person, place, and time.     Motor: Motor  function is intact.     Coordination: Coordination is intact.     Comments: Motor strength is 5/5 bilateral hip flexor knee extensor ankle dorsiflexor Sensation intact to light touch and pin in bilateral lower limbs Deep tendon reflexes are 1+ bilateral knees and ankles Gait without evidence of toe drag or knee instability.  Psychiatric:        Mood and Affect: Mood normal.        Behavior: Behavior normal.           Assessment & Plan:  #1.  History of lumbosacral spondylolisthesis L5-S1 she has no signs of L5 or S1 radiculitis.  There are neurologic signs to indicate a tethered cord. The patient has mainly right-sided low back and buttock pain which is most consistent with sacroiliac pain.  She has not tried a sacroiliac diagnostic/therapeutic injection I would recommend this. We will trial Flector patch, samples given, patient educated Also prescription written from Flector patch twice daily Also may resume tramadol 50 mg twice daily will check UDS next visit.  FLECTOR SAMPLES LOT [id number] EXP 07/07/2019

## 2019-06-20 ENCOUNTER — Other Ambulatory Visit: Payer: Self-pay | Admitting: *Deleted

## 2019-06-20 MED ORDER — TRAMADOL HCL 50 MG PO TABS: 50.0000 mg | ORAL_TABLET | Freq: Two times a day (BID) | ORAL | 2 refills | Status: DC

## 2019-06-23 MED FILL — traMADol HCL 50 MG TABS: 50 | 30 days supply | Qty: 60 | Fill #0

## 2019-07-01 ENCOUNTER — Telehealth (INDEPENDENT_AMBULATORY_CARE_PROVIDER_SITE_OTHER): Payer: Self-pay | Admitting: Licensed Clinical Social Worker

## 2019-07-01 ENCOUNTER — Institutional Professional Consult (permissible substitution) (INDEPENDENT_AMBULATORY_CARE_PROVIDER_SITE_OTHER): Payer: Self-pay | Admitting: Licensed Clinical Social Worker

## 2019-07-01 NOTE — Telephone Encounter (Signed)
Call placed to patient regarding scheduled [facility] appointment. LCSW left message for a return call.  

## 2019-07-17 ENCOUNTER — Ambulatory Visit: Payer: Self-pay | Admitting: Physical Medicine & Rehabilitation

## 2019-07-21 MED FILL — traMADol HCL 50 MG TABS: 50 | 30 days supply | Qty: 60 | Fill #1

## 2019-08-14 ENCOUNTER — Emergency Department (HOSPITAL_COMMUNITY)
Admission: EM | Admit: 2019-08-14 | Discharge: 2019-08-15 | Discharge status: Against Medical Advice | Payer: Self-pay | Attending: Emergency Medicine | Admitting: Emergency Medicine

## 2019-08-14 ENCOUNTER — Other Ambulatory Visit: Payer: Self-pay

## 2019-08-14 ENCOUNTER — Encounter (HOSPITAL_COMMUNITY): Payer: Self-pay | Admitting: Emergency Medicine

## 2019-08-14 DIAGNOSIS — Z5321 Procedure and treatment not carried out due to patient leaving prior to being seen by health care provider: Secondary | ICD-10-CM | POA: Insufficient documentation

## 2019-08-14 LAB — URINALYSIS, ROUTINE W REFLEX MICROSCOPIC
Bilirubin Urine: NEGATIVE
Glucose, UA: NEGATIVE mg/dL
Hgb urine dipstick: NEGATIVE
Ketones, ur: NEGATIVE mg/dL
Nitrite: NEGATIVE
Protein, ur: 30 mg/dL — AB
Specific Gravity, Urine: 1.027 (ref 1.005–1.030)
pH: 5 (ref 5.0–8.0)

## 2019-08-14 LAB — COMPREHENSIVE METABOLIC PANEL
ALT: 17 U/L (ref 0–44)
AST: 15 U/L (ref 15–41)
Albumin: 3.5 g/dL (ref 3.5–5.0)
Alkaline Phosphatase: 66 U/L (ref 38–126)
Anion gap: 8 (ref 5–15)
BUN: 14 mg/dL (ref 6–20)
CO2: 26 mmol/L (ref 22–32)
Calcium: 9.1 mg/dL (ref 8.9–10.3)
Chloride: 102 mmol/L (ref 98–111)
Creatinine, Ser: 0.87 mg/dL (ref 0.44–1.00)
GFR calc Af Amer: >60 mL/min (ref >60)
GFR calc non Af Amer: >60 mL/min (ref >60)
Glucose, Bld: 85 mg/dL (ref 70–99)
Potassium: 4.1 mmol/L (ref 3.5–5.1)
Sodium: 136 mmol/L (ref 135–145)
Total Bilirubin: 0.2 mg/dL — ABNORMAL LOW (ref 0.3–1.2)
Total Protein: 7 g/dL (ref 6.5–8.1)

## 2019-08-14 LAB — CBC
HCT: 39.3 % (ref 36.0–46.0)
Hemoglobin: 12 g/dL (ref 12.0–15.0)
MCH: 25.5 pg — ABNORMAL LOW (ref 26.0–34.0)
MCHC: 30.5 g/dL (ref 30.0–36.0)
MCV: 83.4 fL (ref 80.0–100.0)
Platelets: 388 10*3/uL (ref 150–400)
RBC: 4.71 MIL/uL (ref 3.87–5.11)
RDW: 14.6 % (ref 11.5–15.5)
WBC: 14.7 10*3/uL — ABNORMAL HIGH (ref 4.0–10.5)
nRBC: 0 % (ref 0.0–0.2)

## 2019-08-14 LAB — I-STAT BETA HCG BLOOD, ED (MC, WL, AP ONLY): I-stat hCG, quantitative: <5 m[IU]/mL (ref <5)

## 2019-08-14 LAB — LIPASE, BLOOD: Lipase: 29 U/L (ref 11–51)

## 2019-08-14 MED ORDER — SODIUM CHLORIDE 0.9% FLUSH: 3.0000 mL | Freq: Once | INTRAVENOUS | Status: DC

## 2019-08-14 NOTE — ED Notes (Signed)
Pt asked this tech the wait time. When told, pt states she is leaving and will return in the morning.

## 2019-08-14 NOTE — ED Triage Notes (Signed)
Patient reports LLQ abdominal pain with bladder pressure after voiding , no emesis or diaphoresis . No fever or chills .

## 2019-08-22 MED FILL — traMADol HCL 50 MG TABS: 50 | 30 days supply | Qty: 60 | Fill #2

## 2019-09-26 ENCOUNTER — Other Ambulatory Visit: Payer: Self-pay | Admitting: Physical Medicine & Rehabilitation

## 2019-09-29 MED FILL — traMADol HCL 50 MG TABS: 50 | 30 days supply | Qty: 60 | Fill #0

## 2019-10-13 MED FILL — GABAPENTIN 300 MG CAPSULE: 300 | 30 days supply | Qty: 180 | Fill #0

## 2019-10-27 MED FILL — traMADol HCL 50 MG TABS: 50 | 30 days supply | Qty: 60 | Fill #1

## 2019-11-25 MED FILL — traMADol HCL 50 MG TABS: 50 | 30 days supply | Qty: 60 | Fill #2

## 2019-12-19 ENCOUNTER — Other Ambulatory Visit: Payer: Self-pay | Admitting: Physical Medicine & Rehabilitation

## 2019-12-19 NOTE — Telephone Encounter (Signed)
Needs OV with Zella Ball , UDS within the month

## 2019-12-24 MED FILL — traMADol HCL 50 MG TABS: 50 | 30 days supply | Qty: 60 | Fill #0

## 2020-01-26 ENCOUNTER — Other Ambulatory Visit: Payer: Self-pay | Admitting: Physical Medicine & Rehabilitation

## 2020-01-26 NOTE — Telephone Encounter (Signed)
Contacted patient and left a message that she needs to make a 6 month follow up appointment with Dr. Letta Pate in order to continue receiving Tramadol.

## 2020-01-27 ENCOUNTER — Ambulatory Visit: Payer: Self-pay | Admitting: Physical Medicine & Rehabilitation

## 2020-06-14 ENCOUNTER — Telehealth: Payer: Self-pay | Admitting: Physician Assistant

## 2020-06-14 NOTE — Telephone Encounter (Signed)
I return Pt call, Spoke with Pt and mail a financial application also has the IRS flyer that she need to call to get the 2020 non filling

## 2020-06-14 NOTE — Telephone Encounter (Signed)
Copied from [place] [id number]. Topic: General - Other >> Jun 14, 2020  9:28 AM Leward Quan A wrote: Reason for CRM: Patient would like a call back about the [facility] did not eloberate what was needed just that she want to speak to someone about it. Please advise Ph# [phone]

## 2020-06-22 ENCOUNTER — Other Ambulatory Visit (INDEPENDENT_AMBULATORY_CARE_PROVIDER_SITE_OTHER): Payer: Self-pay | Admitting: Primary Care

## 2020-06-22 ENCOUNTER — Ambulatory Visit (INDEPENDENT_AMBULATORY_CARE_PROVIDER_SITE_OTHER): Payer: Self-pay | Admitting: Licensed Clinical Social Worker

## 2020-06-22 ENCOUNTER — Encounter (INDEPENDENT_AMBULATORY_CARE_PROVIDER_SITE_OTHER): Payer: Self-pay | Admitting: Primary Care

## 2020-06-22 ENCOUNTER — Ambulatory Visit (INDEPENDENT_AMBULATORY_CARE_PROVIDER_SITE_OTHER): Payer: Self-pay | Admitting: Primary Care

## 2020-06-22 ENCOUNTER — Other Ambulatory Visit: Payer: Self-pay

## 2020-06-22 VITALS — BP 98/75 | HR 70 | Temp 97.3°F | Ht 63.0 in | Wt 223.4 lb

## 2020-06-22 DIAGNOSIS — G4700 Insomnia, unspecified: Secondary | ICD-10-CM

## 2020-06-22 DIAGNOSIS — F331 Major depressive disorder, recurrent, moderate: Secondary | ICD-10-CM

## 2020-06-22 DIAGNOSIS — Z23 Encounter for immunization: Secondary | ICD-10-CM

## 2020-06-22 DIAGNOSIS — F418 Other specified anxiety disorders: Secondary | ICD-10-CM

## 2020-06-22 DIAGNOSIS — Z0000 Encounter for general adult medical examination without abnormal findings: Secondary | ICD-10-CM

## 2020-06-22 DIAGNOSIS — F411 Generalized anxiety disorder: Secondary | ICD-10-CM

## 2020-06-22 MED ORDER — ESCITALOPRAM OXALATE 10 MG PO TABS: 10.0000 mg | ORAL_TABLET | Freq: Every day | ORAL | 1 refills | Status: DC

## 2020-06-22 MED ORDER — MELATONIN 10 MG PO TABS: 10.0000 mg | ORAL_TABLET | Freq: Every evening | ORAL | 1 refills | Status: DC | PRN

## 2020-06-22 MED ORDER — MEDROXYPROGESTERONE ACETATE 150 MG/ML IM SUSP: 150.0000 mg | INTRAMUSCULAR | 0 refills | Status: DC

## 2020-06-22 MED FILL — ESCITALOPRAM 10 MG TABLET: 10 | 30 days supply | Qty: 30 | Fill #0

## 2020-06-22 NOTE — Progress Notes (Signed)
Established Patient Office Visit  Subjective:  Patient ID: Wanda Crane, female    DOB: 07-26-1991  Age: 29 y.o. MRN: [id number]  CC:  Chief Complaint  Patient presents with  . Annual Exam   Wanda Crane is a 29 y.o. female presents to office today for annual physical exam examination.    HPI [name] is a 29 year old obese female  presents for physical and depression score high. She last her mother in March 2021 from a MI and several other family members. Grief/depression.  Past Medical History:  Diagnosis Date  . Chronic right-sided low back pain with right-sided sciatica 04/13/2017  . Hemorrhagic cyst of ovary   . Spondylolisthesis, grade 1 11/27/2016    History reviewed. No pertinent surgical history.  Family History  Problem Relation Age of Onset  . Asthma Mother   . COPD Mother   . Diabetes Maternal Grandmother   . Heart disease Maternal Grandmother   . Stroke Maternal Grandmother   . Diabetes Maternal Grandfather   . Heart disease Maternal Grandfather   . Stroke Maternal Grandfather     Social History   Socioeconomic History  . Marital status: Single    Spouse name: Not on file  . Number of children: Not on file  . Years of education: Not on file  . Highest education level: Not on file  Occupational History  . Not on file  Tobacco Use  . Smoking status: Never Smoker  . Smokeless tobacco: Never Used  Vaping Use  . Vaping Use: Never used  Substance and Sexual Activity  . Alcohol use: No  . Drug use: No  . Sexual activity: Yes    Birth control/protection: None  Other Topics Concern  . Not on file  Social History Narrative  . Not on file   Social Determinants of Health   Financial Resource Strain:   . Difficulty of Paying Living Expenses: Not on file  Food Insecurity:   . Worried About Charity fundraiser in the Last Year: Not on file  . Ran Out of Food in the Last Year: Not on file  Transportation Needs:   . Lack of Transportation  (Medical): Not on file  . Lack of Transportation (Non-Medical): Not on file  Physical Activity:   . Days of Exercise per Week: Not on file  . Minutes of Exercise per Session: Not on file  Stress:   . Feeling of Stress : Not on file  Social Connections:   . Frequency of Communication with Friends and Family: Not on file  . Frequency of Social Gatherings with Friends and Family: Not on file  . Attends Religious Services: Not on file  . Active Member of Clubs or Organizations: Not on file  . Attends Archivist Meetings: Not on file  . Marital Status: Not on file  Intimate Partner Violence:   . Fear of Current or Ex-Partner: Not on file  . Emotionally Abused: Not on file  . Physically Abused: Not on file  . Sexually Abused: Not on file   Occupation: unemployed , Marital status: single , Substance use: no Diet: healthy , Exercise: walking  Last eye exam: since a child  Last dental exam: last 2 years  Last pap smear: to be schedule  Refills needed today: no Immunizations needed: Flu Vaccine: no  Tdap Vaccine: no  - every 77yrs - (<3 lifetime doses or unknown): all wounds -- look up need for Tetanus IG - (>=3 lifetime  doses): clean/minor wound if >51yrs from previous; all other wounds if >13yrs from previous Zoster Vaccine: no (those >50yo, once) Pneumonia Vaccine: no (those w/ risk factors) - (<19yr) Both: Immunocompromised, cochlear implant, CSF leak, asplenic, sickle cell, Chronic Renal Failure - (<65yr) PPSV-23 only: Heart dz, lung disease, DM, tobacco abuse, alcoholism, cirrhosis/liver disease. - (>87yr): PPSV13 then PPSV23 in 6-12mths;  - (>36yr): repeat PPSV23 once if pt received prior to 30yo and 45yrs have passed  Outpatient Medications Prior to Visit  Medication Sig Dispense Refill  . albuterol (VENTOLIN HFA) 108 (90 Base) MCG/ACT inhaler Inhale 1-2 puffs into the lungs every 6 (six) hours as needed for wheezing or shortness of breath. (Patient not taking: Reported  on 06/22/2020) 8 g 1  . diclofenac (FLECTOR) 1.3 % PTCH Place 1 patch onto the skin 2 (two) times daily. (Patient not taking: Reported on 06/22/2020) 60 patch 2  . gabapentin (NEURONTIN) 300 MG capsule Take 2 capsules (600 mg total) by mouth 3 (three) times daily. 180 capsule 0  . SUMAtriptan (IMITREX) 25 MG tablet Take 1 tablet (25 mg total) by mouth daily. May take one more tablet two hours after the first. No more than two tablets per day. 20 tablet 3  . traMADol (ULTRAM) 50 MG tablet TAKE 1 TABLET BY MOUTH TWICE DAILY. 60 tablet 0   No facility-administered medications prior to visit.    No Known Allergies  ROS Review of Systems  Psychiatric/Behavioral: Positive for agitation and sleep disturbance.       Grief and depression    Objective:    BP 98/75 (BP Location: Right Arm, Patient Position: Sitting, Cuff Size: Large)   Pulse 70   Temp (!) 97.3 F (36.3 C) (Temporal)   Ht 5\' 3"  (1.6 m)   Wt 223 lb 6.4 oz (101.3 kg)   LMP 05/22/2020 (Approximate)   SpO2 98%   BMI 39.57 kg/m   Wt Readings from Last 3 Encounters:  06/22/20 223 lb 6.4 oz (101.3 kg)  06/19/19 208 lb (94.3 kg)  03/10/19 207 lb (93.9 kg)   Physical Exam Vitals reviewed.  Constitutional:      Appearance: She is obese.  HENT:     Head: Normocephalic.     Right Ear: Tympanic membrane normal.     Left Ear: Tympanic membrane normal.     Nose: Nose normal.  Eyes:     Extraocular Movements: Extraocular movements intact.     Pupils: Pupils are equal, round, and reactive to light.  Cardiovascular:     Rate and Rhythm: Normal rate and regular rhythm.  Pulmonary:     Effort: Pulmonary effort is normal.     Breath sounds: Normal breath sounds.  Abdominal:     General: Bowel sounds are normal. There is distension.     Palpations: Abdomen is soft.  Musculoskeletal:        General: Normal range of motion.     Cervical back: Normal range of motion and neck supple.  Skin:    General: Skin is warm and dry.   Neurological:     Mental Status: She is alert and oriented to person, place, and time.  Psychiatric:        Mood and Affect: Mood normal.        Behavior: Behavior normal.        Thought Content: Thought content normal.        Judgment: Judgment normal.    Wt Readings from Last 3 Encounters:  06/22/20 223 lb  6.4 oz (101.3 kg)  06/19/19 208 lb (94.3 kg)  03/10/19 207 lb (93.9 kg)     Health Maintenance Due  Topic Date Due  . COVID-19 Vaccine (1) Never done  . PAP SMEAR-Modifier  09/19/2018    There are no preventive care reminders to display for this patient.  Lab Results  Component Value Date   TSH 0.522 04/19/2015   Lab Results  Component Value Date   WBC 14.7 (H) 08/14/2019   HGB 12.0 08/14/2019   HCT 39.3 08/14/2019   MCV 83.4 08/14/2019   PLT 388 08/14/2019   Lab Results  Component Value Date   NA 136 08/14/2019   K 4.1 08/14/2019   CO2 26 08/14/2019   GLUCOSE 85 08/14/2019   BUN 14 08/14/2019   CREATININE 0.87 08/14/2019   BILITOT 0.2 (L) 08/14/2019   ALKPHOS 66 08/14/2019   AST 15 08/14/2019   ALT 17 08/14/2019   PROT 7.0 08/14/2019   ALBUMIN 3.5 08/14/2019   CALCIUM 9.1 08/14/2019   ANIONGAP 8 08/14/2019     Assessment & Plan:  Shontia was seen today for annual exam.  Diagnoses and all orders for this visit:  Annual physical exam Completed   Depression with anxiety   Office Visit from 06/22/2020 in [place]  PHQ-9 Total Score 20    At this visit also meet with CSW  -     escitalopram (LEXAPRO) 10 MG tablet; Take 1 tablet (10 mg total) by mouth daily.  Insomnia, unspecified type -     Melatonin 10 MG TABS; Take 10 mg by mouth at bedtime as needed.  Need for immunization against influenza -     Flu Vaccine QUAD 36+ mos IM  Other orders -     medroxyPROGESTERone (DEPO-PROVERA) 150 MG/ML injection; Inject 1 mL (150 mg total) into the muscle every 3 (three) months.    Meds ordered this encounter   Medications  . DISCONTD: medroxyPROGESTERone (DEPO-PROVERA) 150 MG/ML injection    Sig: Inject 1 mL (150 mg total) into the muscle every 3 (three) months.    Dispense:  1 mL    Refill:  0  . escitalopram (LEXAPRO) 10 MG tablet    Sig: Take 1 tablet (10 mg total) by mouth daily.    Dispense:  90 tablet    Refill:  1  . Melatonin 10 MG TABS    Sig: Take 10 mg by mouth at bedtime as needed.    Dispense:  90 tablet    Refill:  1    Follow-up: Return in about 6 weeks (around 08/03/2020) for depression f/u- CSW on same day .  Counseled on healthy lifestyle choices, including diet (rich in fruits, vegetables and lean meats and low in salt and simple carbohydrates) and exercise (at least 30 minutes of moderate physical activity daily).  Patient to follow up in 1 year for annual exam or sooner if needed.  The above assessment and management plan was discussed with the patient. The patient verbalized understanding of and has agreed to the management plan. Patient is aware to call the clinic if symptoms persist or worsen. Patient is aware when to return to the clinic for a follow-up visit. Patient educated on when it is appropriate to go to the emergency department.   Juluis Mire NP-C [id number] [place] [phone]

## 2020-06-22 NOTE — Patient Instructions (Signed)
Health Maintenance, Female Adopting a healthy lifestyle and getting preventive care are important in promoting health and wellness. Ask your health care provider about:  The right schedule for you to have regular tests and exams.  Things you can do on your own to prevent diseases and keep yourself healthy. What should I know about diet, weight, and exercise? Eat a healthy diet   Eat a diet that includes plenty of vegetables, fruits, low-fat dairy products, and lean protein.  Do not eat a lot of foods that are high in solid fats, added sugars, or sodium. Maintain a healthy weight Body mass index (BMI) is used to identify weight problems. It estimates body fat based on height and weight. Your health care provider can help determine your BMI and help you achieve or maintain a healthy weight. Get regular exercise Get regular exercise. This is one of the most important things you can do for your health. Most adults should:  Exercise for at least 150 minutes each week. The exercise should increase your heart rate and make you sweat (moderate-intensity exercise).  Do strengthening exercises at least twice a week. This is in addition to the moderate-intensity exercise.  Spend less time sitting. Even light physical activity can be beneficial. Watch cholesterol and blood lipids Have your blood tested for lipids and cholesterol at 30 years of age, then have this test every 5 years. Have your cholesterol levels checked more often if:  Your lipid or cholesterol levels are high.  You are older than 30 years of age.  You are at high risk for heart disease. What should I know about cancer screening? Depending on your health history and family history, you may need to have cancer screening at various ages. This may include screening for:  Breast cancer.  Cervical cancer.  Colorectal cancer.  Skin cancer.  Lung cancer. What should I know about heart disease, diabetes, and high blood  pressure? Blood pressure and heart disease  High blood pressure causes heart disease and increases the risk of stroke. This is more likely to develop in people who have high blood pressure readings, are of African descent, or are overweight.  Have your blood pressure checked: ? Every 3-5 years if you are 18-39 years of age. ? Every year if you are 40 years old or older. Diabetes Have regular diabetes screenings. This checks your fasting blood sugar level. Have the screening done:  Once every three years after age 40 if you are at a normal weight and have a low risk for diabetes.  More often and at a younger age if you are overweight or have a high risk for diabetes. What should I know about preventing infection? Hepatitis B If you have a higher risk for hepatitis B, you should be screened for this virus. Talk with your health care provider to find out if you are at risk for hepatitis B infection. Hepatitis C Testing is recommended for:  Everyone born from 1945 through 1965.  Anyone with known risk factors for hepatitis C. Sexually transmitted infections (STIs)  Get screened for STIs, including gonorrhea and chlamydia, if: ? You are sexually active and are younger than 30 years of age. ? You are older than 30 years of age and your health care provider tells you that you are at risk for this type of infection. ? Your sexual activity has changed since you were last screened, and you are at increased risk for chlamydia or gonorrhea. Ask your health care provider if   you are at risk.  Ask your health care provider about whether you are at high risk for HIV. Your health care provider may recommend a prescription medicine to help prevent HIV infection. If you choose to take medicine to prevent HIV, you should first get tested for HIV. You should then be tested every 3 months for as long as you are taking the medicine. Pregnancy  If you are about to stop having your period (premenopausal) and  you may become pregnant, seek counseling before you get pregnant.  Take 400 to 800 micrograms (mcg) of folic acid every day if you become pregnant.  Ask for birth control (contraception) if you want to prevent pregnancy. Osteoporosis and menopause Osteoporosis is a disease in which the bones lose minerals and strength with aging. This can result in bone fractures. If you are 65 years old or older, or if you are at risk for osteoporosis and fractures, ask your health care provider if you should:  Be screened for bone loss.  Take a calcium or vitamin D supplement to lower your risk of fractures.  Be given hormone replacement therapy (HRT) to treat symptoms of menopause. Follow these instructions at home: Lifestyle  Do not use any products that contain nicotine or tobacco, such as cigarettes, e-cigarettes, and chewing tobacco. If you need help quitting, ask your health care provider.  Do not use street drugs.  Do not share needles.  Ask your health care provider for help if you need support or information about quitting drugs. Alcohol use  Do not drink alcohol if: ? Your health care provider tells you not to drink. ? You are pregnant, may be pregnant, or are planning to become pregnant.  If you drink alcohol: ? Limit how much you use to 0-1 drink a day. ? Limit intake if you are breastfeeding.  Be aware of how much alcohol is in your drink. In the [place] one drink equals one 12 oz bottle of beer (355 mL), one 5 oz glass of wine (148 mL), or one 1 oz glass of hard liquor (44 mL). General instructions  Schedule regular health, dental, and eye exams.  Stay current with your vaccines.  Tell your health care provider if: ? You often feel depressed. ? You have ever been abused or do not feel safe at home. Summary  Adopting a healthy lifestyle and getting preventive care are important in promoting health and wellness.  Follow your health care provider's instructions about healthy  diet, exercising, and getting tested or screened for diseases.  Follow your health care provider's instructions on monitoring your cholesterol and blood pressure. This information is not intended to replace advice given to you by your health care provider. Make sure you discuss any questions you have with your health care provider. Document Revised: 07/17/2018 Document Reviewed: 07/17/2018 Elsevier Patient Education  2020 Elsevier Inc.  

## 2020-06-29 MED FILL — ESCITALOPRAM 10 MG TABLET: 10 | 30 days supply | Qty: 30 | Fill #0

## 2020-06-29 NOTE — BH Specialist Note (Signed)
Integrated Behavioral Health Initial In-Person Visit  MRN: [id number] Name: Wanda Crane  Number of [facility] Clinician visits:: 1/6 Session Start time: 11:00 AM  Session End time: 11:20 AM Total time: 20 minutes  Types of Service: Individual psychotherapy  Interpretor:No. Interpretor Name and Language: NA   Warm Hand Off Completed.       Subjective: Wanda Crane is a 29 y.o. female accompanied by self Patient was referred by NP Oletta Lamas for depression and anxiety. Patient reports the following symptoms/concerns: Pt reports increase in depression and anxiety symptoms triggered by grief and psychosocial stressors. Symptoms include feeling nervous, shaking, panic attacks, and irritability Duration of problem: Ongoing, Pt shared that co-occurring disorders run in family; Severity of problem: moderate  Objective: Mood: Anxious and Affect: Appropriate Risk of harm to self or others: No plan to harm self or others  Life Context: Family and Social: Pt receives support from family School/Work: Pt is uninsured Self-Care: Pt is interested in medication management Life Changes: Pt reports difficulty managing depression and anxiety symptoms triggered by grief and psychosocial stressors  Patient and/or Family's Strengths/Protective Factors: Social connections, Social and Emotional competence, Concrete supports in place (healthy food, safe environments, etc.) and Sense of purpose  Goals Addressed: Patient will: 1. Reduce symptoms of: anxiety and depression Pt agreed to comply with medication management 2. Increase knowledge and/or ability of: coping skills Pt agreed to utilize grounding interventions to assist with management of anxiety, including horseback riding and spending time with family  Progress towards Goals: Ongoing  Interventions: Interventions utilized: Mindfulness or Relaxation Training  Standardized Assessments completed: GAD-7 and PHQ 2&9  Patient  and/or Family Response: Pt was engaged during session and was successful in identifying healthy coping skills to assist with management of symptoms.   Patient Centered Plan: Patient is on the following Treatment Plan(s):  Anxiety and Depression  Assessment: Patient currently experiencing difficulty managing anxiety and depression symptoms.   Patient may benefit from medication management and therapy.  Plan: 1. Follow up with behavioral health clinician on : 07/06/2020 2. Behavioral recommendations: Utilize strategies discussed and comply with med management 3. Referral(s): [facility] (In Clinic) 4. "From scale of 1-10, how likely are you to follow plan?":   Rebekah Chesterfield, LCSW 06/29/2020 9:27 AM

## 2020-07-06 ENCOUNTER — Ambulatory Visit (INDEPENDENT_AMBULATORY_CARE_PROVIDER_SITE_OTHER): Payer: Self-pay | Admitting: Licensed Clinical Social Worker

## 2020-07-06 ENCOUNTER — Other Ambulatory Visit: Payer: Self-pay

## 2020-07-06 DIAGNOSIS — F411 Generalized anxiety disorder: Secondary | ICD-10-CM

## 2020-07-06 DIAGNOSIS — F331 Major depressive disorder, recurrent, moderate: Secondary | ICD-10-CM

## 2020-07-06 NOTE — BH Specialist Note (Addendum)
Integrated Behavioral Health Follow Up In-Person Visit  MRN: [id number] Name: Wanda Crane  Number of [facility] Clinician visits: 2/6 Session Start time: 10:30 AM  Session End time: 10:50 Total time: 20 minutes  Types of Service: Individual psychotherapy  Interpretor:No. Interpretor Name and Language: NA  Subjective: Wanda Crane is a 29 y.o. female accompanied by self Patient was referred by NP Edwards for depression and anxiety. Patient reports the following symptoms/concerns: Pt reports difficulty managing depression and anxiety symptoms, including panic attacks, difficulty sleeping, and irritability. Endorsed no increase in sleep with melatonin, as recommended by PCP Duration of problem: Ongoing; Severity of problem: moderate  Objective: Mood: Depressed and Affect: Depressed Risk of harm to self or others: No plan to harm self or others  Life Context: Family and Social: Pt receives support from family School/Work: Pt is uninsured Self-Care: Pt is participating in medication management through PCP Life Changes: Pt reports ongoing stressors  Patient and/or Family's Strengths/Protective Factors: Social connections, Social and Emotional competence, Concrete supports in place (healthy food, safe environments, etc.) and Sense of purpose  Goals Addressed: Patient will: 1.  Reduce symptoms of: anxiety and depression Pt agreed to continue compliance with medication management 2. Pt agreed to utilize sleep hygiene tips 3.  Demonstrate ability to: Increase adequate support systems for patient/family Pt agreed to complete financial counseling through clinic to assist with medical costs/access to assist with referral to Ortho  Progress towards Goals: Revised  Interventions: Interventions utilized:  Solution-Focused Strategies Standardized Assessments completed: GAD-7 and PHQ 2&9  Patient Response: Pt was engaged in session to assist with establishing goals to  assist in management of symptoms  Patient Centered Plan: Patient is on the following Treatment Plan(s): Anxiety and Depression  Assessment: Patient currently experiencing symptoms of depression and anxiety. Reports melatonin was not effective in obtaining sleep, negatively impacting physical and mental health.   Patient may benefit from therapy and continued medication management.  Plan: 1. Follow up with behavioral health clinician on : 07/20/2020 2. Behavioral recommendations: Utilize strategies discussed and comply with meds 3. Referral(s): [facility] (In Clinic) 4. "From scale of 1-10, how likely are you to follow plan?":   Rebekah Chesterfield, LCSW 07/09/2020 12:56 PM

## 2020-07-20 ENCOUNTER — Ambulatory Visit (INDEPENDENT_AMBULATORY_CARE_PROVIDER_SITE_OTHER): Payer: Self-pay | Admitting: Licensed Clinical Social Worker

## 2020-07-20 ENCOUNTER — Other Ambulatory Visit: Payer: Self-pay

## 2020-07-20 DIAGNOSIS — F331 Major depressive disorder, recurrent, moderate: Secondary | ICD-10-CM

## 2020-07-20 DIAGNOSIS — F411 Generalized anxiety disorder: Secondary | ICD-10-CM

## 2020-07-22 NOTE — BH Specialist Note (Signed)
Follow up call placed to patient. Patient reports ongoing difficulty obtaining sleep, panic attacks, irritability, and feelings of worry. She shared that there are no change in symptoms with current medication regimen.   LCSW discussed the benefits of establishing medication management and therapy with [facility] to assist with management of symptoms. Pt has an upcoming appointment with PCP on 07/27/20 where she can discuss treatment plan. Pt verbalized understanding and agreed to continue with current medications until scheduled appointment. No additional concerns noted

## 2020-07-27 ENCOUNTER — Telehealth (INDEPENDENT_AMBULATORY_CARE_PROVIDER_SITE_OTHER): Payer: Self-pay | Admitting: Primary Care

## 2020-09-10 ENCOUNTER — Other Ambulatory Visit: Payer: Self-pay

## 2020-09-10 ENCOUNTER — Encounter (INDEPENDENT_AMBULATORY_CARE_PROVIDER_SITE_OTHER): Payer: Self-pay | Admitting: Primary Care

## 2020-09-10 ENCOUNTER — Other Ambulatory Visit (INDEPENDENT_AMBULATORY_CARE_PROVIDER_SITE_OTHER): Payer: Self-pay | Admitting: Primary Care

## 2020-09-10 ENCOUNTER — Ambulatory Visit (INDEPENDENT_AMBULATORY_CARE_PROVIDER_SITE_OTHER): Payer: Self-pay | Admitting: Primary Care

## 2020-09-10 VITALS — BP 102/71 | HR 86 | Temp 97.3°F | Resp 16 | Wt 216.0 lb

## 2020-09-10 DIAGNOSIS — F331 Major depressive disorder, recurrent, moderate: Secondary | ICD-10-CM

## 2020-09-10 MED ORDER — ESCITALOPRAM OXALATE 20 MG PO TABS: 20.0000 mg | ORAL_TABLET | Freq: Every day | ORAL | 1 refills | Status: DC

## 2020-09-10 MED FILL — ESCITALOPRAM 20 MG TABLET: 20 | 30 days supply | Qty: 30 | Fill #0

## 2020-09-10 NOTE — Progress Notes (Signed)
Telephone Note  I connected with Wanda Crane on 09/10/20 at 10:50 AM EST by telephone and verified that I am speaking with the correct person using two identifiers.  Location: Patient: home Provider: Kerin Perna @[facility]    I discussed the limitations, risks, security and privacy concerns of performing an evaluation and management service by telephone and the availability of in person appointments. I also discussed with the patient that there may be a patient responsible charge related to this service. The patient expressed understanding and agreed to proceed.   History of Present Illness: [name] is a 30 year old female having a telemetry follow-up for medication management and effectiveness.  Previously prescribed Lexapro 10 mg.  She feels like the medication is not as effective as when she first started it.  Increase anxiety and depression.  Denies any suicidal ideation or harm to self or others.  She does not have any other concerns.   Past Medical History:  Diagnosis Date  . Chronic right-sided low back pain with right-sided sciatica 04/13/2017  . Hemorrhagic cyst of ovary   . Spondylolisthesis, grade 1 11/27/2016   Current Outpatient Medications on File Prior to Visit  Medication Sig Dispense Refill  . Melatonin 10 MG TABS Take 10 mg by mouth at bedtime as needed. 90 tablet 1   No current facility-administered medications on file prior to visit.   Observations/Objective: Pertinent positives and negative are noted in the HPI  Assessment and Plan: Diagnoses and all orders for this visit:  Moderate episode of recurrent major depressive disorder (HCC) Increased Lexapro from 10 mg to 20 mg reevaluate in 4 to 6 weeks.  She also feels that having someone to talk with would also help manage her depression.  Provided information for walk-in clinic behavioral health on [facility] Office Visit from 09/10/2020 in [place]  PHQ-9 Total  Score 24     -     escitalopram (LEXAPRO) 20 MG tablet; Take 1 tablet (20 mg total) by mouth daily. -     Ambulatory referral to Psychiatry    Follow Up Instructions:    I discussed the assessment and treatment plan with the patient. The patient was provided an opportunity to ask questions and all were answered. The patient agreed with the plan and demonstrated an understanding of the instructions.   The patient was advised to call back or seek an in-person evaluation if the symptoms worsen or if the condition fails to improve as anticipated.  I provided 20 minutes of non-face-to-face time during this encounter.   Kerin Perna, NP

## 2020-09-10 NOTE — Progress Notes (Signed)
Medication follow up- PH-Q9  GAD

## 2020-09-20 ENCOUNTER — Encounter (HOSPITAL_COMMUNITY): Payer: Self-pay

## 2020-09-20 ENCOUNTER — Other Ambulatory Visit: Payer: Self-pay

## 2020-09-20 ENCOUNTER — Emergency Department (HOSPITAL_COMMUNITY): Payer: Self-pay

## 2020-09-20 ENCOUNTER — Emergency Department (HOSPITAL_COMMUNITY)
Admission: EM | Admit: 2020-09-20 | Discharge: 2020-09-20 | Disposition: A | Discharge status: Home / Self Care | Payer: Self-pay | Attending: Emergency Medicine | Admitting: Emergency Medicine

## 2020-09-20 ENCOUNTER — Other Ambulatory Visit (HOSPITAL_COMMUNITY): Payer: Self-pay | Admitting: Medical

## 2020-09-20 DIAGNOSIS — N390 Urinary tract infection, site not specified: Secondary | ICD-10-CM | POA: Insufficient documentation

## 2020-09-20 DIAGNOSIS — D259 Leiomyoma of uterus, unspecified: Secondary | ICD-10-CM | POA: Insufficient documentation

## 2020-09-20 DIAGNOSIS — R1030 Lower abdominal pain, unspecified: Secondary | ICD-10-CM

## 2020-09-20 LAB — I-STAT BETA HCG BLOOD, ED (MC, WL, AP ONLY): I-stat hCG, quantitative: <5 m[IU]/mL (ref <5)

## 2020-09-20 LAB — COMPREHENSIVE METABOLIC PANEL
ALT: 28 U/L (ref 0–44)
AST: 26 U/L (ref 15–41)
Albumin: 4.2 g/dL (ref 3.5–5.0)
Alkaline Phosphatase: 58 U/L (ref 38–126)
Anion gap: 13 (ref 5–15)
BUN: 9 mg/dL (ref 6–20)
CO2: 21 mmol/L — ABNORMAL LOW (ref 22–32)
Calcium: 9.5 mg/dL (ref 8.9–10.3)
Chloride: 104 mmol/L (ref 98–111)
Creatinine, Ser: 0.81 mg/dL (ref 0.44–1.00)
GFR, Estimated: >60 mL/min (ref >60)
Glucose, Bld: 141 mg/dL — ABNORMAL HIGH (ref 70–99)
Potassium: 3.9 mmol/L (ref 3.5–5.1)
Sodium: 138 mmol/L (ref 135–145)
Total Bilirubin: 0.7 mg/dL (ref 0.3–1.2)
Total Protein: 8.1 g/dL (ref 6.5–8.1)

## 2020-09-20 LAB — URINALYSIS, ROUTINE W REFLEX MICROSCOPIC
Bilirubin Urine: NEGATIVE
Glucose, UA: NEGATIVE mg/dL
Ketones, ur: NEGATIVE mg/dL
Nitrite: NEGATIVE
Protein, ur: NEGATIVE mg/dL
Specific Gravity, Urine: 1.019 (ref 1.005–1.030)
pH: 7 (ref 5.0–8.0)

## 2020-09-20 LAB — CBC
HCT: 43.6 % (ref 36.0–46.0)
Hemoglobin: 12.9 g/dL (ref 12.0–15.0)
MCH: 25.2 pg — ABNORMAL LOW (ref 26.0–34.0)
MCHC: 29.6 g/dL — ABNORMAL LOW (ref 30.0–36.0)
MCV: 85.3 fL (ref 80.0–100.0)
Platelets: 379 10*3/uL (ref 150–400)
RBC: 5.11 MIL/uL (ref 3.87–5.11)
RDW: 15.5 % (ref 11.5–15.5)
WBC: 12.4 10*3/uL — ABNORMAL HIGH (ref 4.0–10.5)
nRBC: 0 % (ref 0.0–0.2)

## 2020-09-20 LAB — LIPASE, BLOOD: Lipase: 26 U/L (ref 11–51)

## 2020-09-20 MED ORDER — KETOROLAC TROMETHAMINE 30 MG/ML IJ SOLN
30.0000 mg | Freq: Once | INTRAMUSCULAR | Status: AC
  Administered 2020-09-20: 30 mg via INTRAVENOUS
  Filled 2020-09-20: qty 1

## 2020-09-20 MED ORDER — SODIUM CHLORIDE 0.9 % IV SOLN
1.0000 g | Freq: Once | INTRAVENOUS | Status: AC
  Administered 2020-09-20: 1 g via INTRAVENOUS
  Filled 2020-09-20: qty 10

## 2020-09-20 MED ORDER — IOHEXOL 300 MG/ML  SOLN
100.0000 mL | Freq: Once | INTRAMUSCULAR | Status: AC | PRN
  Administered 2020-09-20: 100 mL via INTRAVENOUS

## 2020-09-20 MED ORDER — CEPHALEXIN 500 MG PO CAPS: 500.0000 mg | ORAL_CAPSULE | Freq: Two times a day (BID) | ORAL | 0 refills | Status: DC

## 2020-09-20 NOTE — ED Provider Notes (Signed)
[facility] DEPT Provider Note   CSN: [id number] Arrival date & time: 09/20/20  1704     History Chief Complaint  Patient presents with  . Abdominal Pain    Wanda Crane is a 30 y.o. female who presents to the ED today with complaint of gradual onset, constant, sharp/stabbing, diffuse lower abdominal pain that began 3 days ago. Pt reports history of similar symptoms when she had a ruptured ovarian cyst approximately 1 year ago. She has applied a heating pad without relief. Has not taken anything for pain. She assumed that her symptoms may be related to constipation and took a laxative without much relief. Pt repots last normal bowel movement 3 days ago. She typically goes every day. She is still passing gas. No previous abdominal surgeries. Pt denies fevers, chills, nausea, vomiting, diarrhea, dysuria, urinary frequency, urgency, vaginal discharge, pelvic pain, or any other associated symptoms. Pt is sexually active with her husband; she is not concerned regarding STIs today. LNMP 02/06.   The history is provided by the patient and medical records.       Past Medical History:  Diagnosis Date  . Chronic right-sided low back pain with right-sided sciatica 04/13/2017  . Hemorrhagic cyst of ovary   . Spondylolisthesis, grade 1 11/27/2016    Patient Active Problem List   Diagnosis Date Noted  . Chronic right-sided low back pain with right-sided sciatica 04/13/2017  . Spondylolisthesis, grade 1 11/27/2016    History reviewed. No pertinent surgical history.   OB History    Gravida  0   Para  0   Term  0   Preterm  0   AB  0   Living  0     SAB  0   IAB  0   Ectopic  0   Multiple  0   Live Births  0           Family History  Problem Relation Age of Onset  . Asthma Mother   . COPD Mother   . Diabetes Maternal Grandmother   . Heart disease Maternal Grandmother   . Stroke Maternal Grandmother   . Diabetes Maternal Grandfather    . Heart disease Maternal Grandfather   . Stroke Maternal Grandfather     Social History   Tobacco Use  . Smoking status: Never Smoker  . Smokeless tobacco: Never Used  Vaping Use  . Vaping Use: Never used  Substance Use Topics  . Alcohol use: No  . Drug use: No    Home Medications Prior to Admission medications   Medication Sig Start Date End Date Taking? Authorizing Provider  cephALEXin (KEFLEX) 500 MG capsule Take 1 capsule (500 mg total) by mouth 2 (two) times daily for 5 days. 09/20/20 09/25/20 Yes Lyndsee Casa, PA-C  escitalopram (LEXAPRO) 20 MG tablet Take 1 tablet (20 mg total) by mouth daily. 09/10/20  Yes Kerin Perna, NP  Melatonin 10 MG TABS Take 10 mg by mouth at bedtime as needed. Patient not taking: No sig reported 06/22/20   Kerin Perna, NP    Allergies    Patient has no known allergies.  Review of Systems   Review of Systems  Constitutional: Negative for chills and fever.  Gastrointestinal: Positive for abdominal pain and constipation. Negative for diarrhea, nausea and vomiting.  Genitourinary: Negative for decreased urine volume, hematuria, menstrual problem, urgency and vaginal pain.  All other systems reviewed and are negative.   Physical Exam Updated Vital Signs BP  110/70 (BP Location: Right Arm)   Pulse 74   Temp 98.7 F (37.1 C) (Oral)   Resp 15   LMP 09/11/2020   SpO2 100%   Physical Exam Vitals and nursing note reviewed.  Constitutional:      Appearance: She is obese. She is not ill-appearing or diaphoretic.  HENT:     Head: Normocephalic and atraumatic.  Eyes:     Conjunctiva/sclera: Conjunctivae normal.  Cardiovascular:     Rate and Rhythm: Normal rate and regular rhythm.     Heart sounds: Normal heart sounds.  Pulmonary:     Effort: Pulmonary effort is normal.     Breath sounds: Normal breath sounds. No wheezing, rhonchi or rales.  Abdominal:     General: Abdomen is flat. Bowel sounds are normal.     Palpations:  Abdomen is soft.     Tenderness: There is abdominal tenderness in the right lower quadrant, suprapubic area and left lower quadrant. There is no guarding or rebound. Negative signs include McBurney's sign.  Genitourinary:    Comments: Deferred by patient Musculoskeletal:     Cervical back: Neck supple.  Skin:    General: Skin is warm and dry.  Neurological:     Mental Status: She is alert.     ED Results / Procedures / Treatments   Labs (all labs ordered are listed, but only abnormal results are displayed) Labs Reviewed  COMPREHENSIVE METABOLIC PANEL - Abnormal; Notable for the following components:      Result Value   CO2 21 (*)    Glucose, Bld 141 (*)    All other components within normal limits  CBC - Abnormal; Notable for the following components:   WBC 12.4 (*)    MCH 25.2 (*)    MCHC 29.6 (*)    All other components within normal limits  URINALYSIS, ROUTINE W REFLEX MICROSCOPIC - Abnormal; Notable for the following components:   APPearance CLOUDY (*)    Hgb urine dipstick SMALL (*)    Leukocytes,Ua LARGE (*)    Bacteria, UA RARE (*)    Crystals PRESENT (*)    All other components within normal limits  URINE CULTURE  LIPASE, BLOOD  I-STAT BETA HCG BLOOD, ED ([facility], [facility], [facility] ONLY)    EKG None  Radiology US Transvaginal Non-OB  Result Date: 09/20/2020 CLINICAL DATA:  Pelvic pain x3 days. EXAM: TRANSABDOMINAL AND TRANSVAGINAL ULTRASOUND OF PELVIS DOPPLER ULTRASOUND OF OVARIES TECHNIQUE: Both transabdominal and transvaginal ultrasound examinations of the pelvis were performed. Transabdominal technique was performed for global imaging of the pelvis including uterus, ovaries, adnexal regions, and pelvic cul-de-sac. It was necessary to proceed with endovaginal exam following the transabdominal exam to visualize the ovaries. Color and duplex Doppler ultrasound was utilized to evaluate blood flow to the ovaries. COMPARISON:  None. FINDINGS: Uterus Measurements: 8.7 x 4.6 x 5.9 cm  = volume: 122 mL. There is a 2.5 cm fibroid. This fibroid appears to be intramural in location. Endometrium Thickness: 7 mm.  No focal abnormality visualized. Right ovary Measurements: 3.3 x 1.9 x 2.5 cm = volume: 8.3 mL. Normal appearance/no adnexal mass. Left ovary Measurements: 3.2 x 2.1 x 2.6 cm = volume: 9.1 mL. Normal appearance/no adnexal mass. Pulsed Doppler evaluation of both ovaries demonstrates normal low-resistance arterial and venous waveforms. Other findings No abnormal free fluid. IMPRESSION: 1. No acute abnormality. 2. There is a 2.5 cm uterine fibroid. Electronically Signed   By: Constance Holster M.D.   On: 09/20/2020 20:38  US Pelvis Complete  Result Date: 09/20/2020 CLINICAL DATA:  Pelvic pain x3 days. EXAM: TRANSABDOMINAL AND TRANSVAGINAL ULTRASOUND OF PELVIS DOPPLER ULTRASOUND OF OVARIES TECHNIQUE: Both transabdominal and transvaginal ultrasound examinations of the pelvis were performed. Transabdominal technique was performed for global imaging of the pelvis including uterus, ovaries, adnexal regions, and pelvic cul-de-sac. It was necessary to proceed with endovaginal exam following the transabdominal exam to visualize the ovaries. Color and duplex Doppler ultrasound was utilized to evaluate blood flow to the ovaries. COMPARISON:  None. FINDINGS: Uterus Measurements: 8.7 x 4.6 x 5.9 cm = volume: 122 mL. There is a 2.5 cm fibroid. This fibroid appears to be intramural in location. Endometrium Thickness: 7 mm.  No focal abnormality visualized. Right ovary Measurements: 3.3 x 1.9 x 2.5 cm = volume: 8.3 mL. Normal appearance/no adnexal mass. Left ovary Measurements: 3.2 x 2.1 x 2.6 cm = volume: 9.1 mL. Normal appearance/no adnexal mass. Pulsed Doppler evaluation of both ovaries demonstrates normal low-resistance arterial and venous waveforms. Other findings No abnormal free fluid. IMPRESSION: 1. No acute abnormality. 2. There is a 2.5 cm uterine fibroid. Electronically Signed   By:  Constance Holster M.D.   On: 09/20/2020 20:38   CT Abdomen Pelvis W Contrast  Result Date: 09/20/2020 CLINICAL DATA:  Acute abdominal pain.  Lower abdominal pain. EXAM: CT ABDOMEN AND PELVIS WITH CONTRAST TECHNIQUE: Multidetector CT imaging of the abdomen and pelvis was performed using the standard protocol following bolus administration of intravenous contrast. CONTRAST:  115mL OMNIPAQUE IOHEXOL 300 MG/ML  SOLN COMPARISON:  Pelvic ultrasound earlier today.  CT 08/23/2017 FINDINGS: Lower chest: Lung bases are clear. Hepatobiliary: Decreased hepatic density consistent with steatosis. No focal hepatic lesion. Gallbladder physiologically distended, no calcified stone. No biliary dilatation. Pancreas: No ductal dilatation or inflammation. Spleen: Normal in size without focal abnormality. Adrenals/Urinary Tract: Normal adrenal glands. No hydronephrosis or perinephric edema. Homogeneous renal enhancement. No evidence of renal calculi. 12 mm simple cyst in the posterior lower left kidney. Urinary bladder is physiologically distended without wall thickening. Stomach/Bowel: Decompressed unremarkable stomach. Normal positioning of the duodenum and ligament of Treitz. Normal small bowel without obstruction or inflammation. Normal appendix, for example series 4, image 59. small to moderate colonic stool burden without wall thickening or inflammation. Vascular/Lymphatic: Normal vascular structures. Patent portal vein. No enlarged lymph nodes in the abdomen or pelvis. Reproductive: Uterine fibroid is seen on pelvic ultrasound earlier today. Ovaries are symmetric in size. No adnexal mass. Other: Trace free fluid in the cul-de-sac. No ascites. No free air. No abscess. 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 There are no acute or suspicious osseous abnormalities. IMPRESSION: 1. No acute abnormality in the abdomen/pelvis. 2. Hepatic steatosis. 3. 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 Electronically Signed   By: Keith Rake M.D.   On: 09/20/2020 21:46   [place] Art/Ven Flow Abd Pelv Doppler  Result Date: 09/20/2020 CLINICAL DATA:  Pelvic pain x3 days. EXAM: TRANSABDOMINAL AND TRANSVAGINAL ULTRASOUND OF PELVIS DOPPLER ULTRASOUND OF OVARIES TECHNIQUE: Both transabdominal and transvaginal ultrasound examinations of the pelvis were performed. Transabdominal technique was performed for global imaging of the pelvis including uterus, ovaries, adnexal regions, and pelvic cul-de-sac. It was necessary to proceed with endovaginal exam following the transabdominal exam to visualize the ovaries. Color and duplex Doppler ultrasound was utilized to evaluate blood flow to the ovaries. COMPARISON:  None. FINDINGS: Uterus Measurements: 8.7 x 4.6 x 5.9 cm =  volume: 122 mL. There is a 2.5 cm fibroid. This fibroid appears to be intramural in location. Endometrium Thickness: 7 mm.  No focal abnormality visualized. Right ovary Measurements: 3.3 x 1.9 x 2.5 cm = volume: 8.3 mL. Normal appearance/no adnexal mass. Left ovary Measurements: 3.2 x 2.1 x 2.6 cm = volume: 9.1 mL. Normal appearance/no adnexal mass. Pulsed Doppler evaluation of both ovaries demonstrates normal low-resistance arterial and venous waveforms. Other findings No abnormal free fluid. IMPRESSION: 1. No acute abnormality. 2. There is a 2.5 cm uterine fibroid. Electronically Signed   By: Constance Holster M.D.   On: 09/20/2020 20:38    Procedures Procedures   Medications Ordered in ED Medications  cefTRIAXone (ROCEPHIN) 1 g in sodium chloride 0.9 % 100 mL IVPB (1 g Intravenous New Bag/Given 09/20/20 2203)  ketorolac (TORADOL) 30 MG/ML injection 30 mg (30 mg Intravenous Given 09/20/20 2122)  iohexol (OMNIPAQUE) 300 MG/ML solution 100 mL (100 mLs Intravenous Contrast Given 09/20/20 2130)    ED Course  I have reviewed the triage vital signs and the nursing  notes.  Pertinent labs & imaging results that were available during my care of the patient were reviewed by me and considered in my medical decision making (see chart for details).    MDM Rules/Calculators/A&P                           30 year old female who presents to the ED today with complaint of lower abdominal pain stabbing in nature for the past 3 days, history of ruptured ovarian cyst and states this feels similar.  Has not followed up with OB/GYN for same.  Denies any vaginal discharge or pelvic pain.  Sexually active with her husband, not concerned for STIs.  On arrival to the ED vitals are stable.  Patient is afebrile, nontachycardic nontachypneic.  She appears to be in no acute distress.  Labs were obtained while patient was in the waiting room.  BC with a leukocytosis of 12,000 however appears that patient is chronically elevated.   WBC  Date Value Ref Range Status  09/20/2020 12.4 (H) 4.0 - 10.5 K/uL Final  08/14/2019 14.7 (H) 4.0 - 10.5 K/uL Final  02/20/2018 11.9 (H) 4.0 - 10.5 K/uL Final  08/22/2017 12.2 (H) 4.0 - 10.5 K/uL Final   CMP with glucose 141, bicarb 21.  No gap.  No other lateral abnormalities.  LFTs unremarkable. Lipase within normal limits at 26. Beta hcg negative UA with small hemoglobin on dipstick, 21-50 red blood cells.  Large leuks, 11-20 white blood cells, rare bacteria however 11-20 squamous epithelium. Pt without any urinary complaints at this time. No flank pain. Will send for culture however do not feel pt requires treatment for UTI at this time.   On exam pt has lower abdominal TTP across RLQ,suprapubic, and LLQ without rebound or guarding. No obvious McBurney's point. Low suspicion for acute abdomen including appendicitis at this time. Pt has deferred GU exam however feel she needs a pelvic ultrasound today given hx of ruptured cyst in the past that feels similar. If no acute findings may consider CT scan.   Ultrasound without acute findings beside  uterine fibroid which I do not suspect is causing pain at this time. Will proceed with CT scan.   CT scan IMPRESSION:  1. No acute abnormality in the abdomen/pelvis.  2. Hepatic steatosis.  3. 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  No acute findings at this time. Given urinalysis does look slightly infectious today with lower abdominal pain will treat for UTI. Rocephin provided with plans to dispo with oral abx. Pt advised to follow up with PCP this week for reevaluation. She is in agreement with plan. Will be discharged once rocephin finished running.   This note was prepared using Dragon voice recognition software and may include unintentional dictation errors due to the inherent limitations of voice recognition software.  Final Clinical Impression(s) / ED Diagnoses Final diagnoses:  Lower abdominal pain  Lower urinary tract infectious disease  Uterine leiomyoma, unspecified location    Rx / DC Orders ED Discharge Orders         Ordered    cephALEXin (KEFLEX) 500 MG capsule  2 times daily        09/20/20 2158           Discharge Instructions     Your workup was reassuring today. Your urine did appear slightly infectious today and we will treat you as if you having a urinary tract infection. We have sent your urine for culture and will call you in 2-3 days time if the antibiotic needs to be changed.   Please follow up with your PCP for reevaluation next week.   Take Ibuprofen and Tylenol as needed for pain  Return to the ED for any worsening symptoms        Eustaquio Maize, Hershal Coria 09/20/20 2204    Quintella Reichert, MD 09/20/20 2236

## 2020-09-20 NOTE — ED Triage Notes (Signed)
Pt reports lower abdominal that radiates to lower back. Pt denies N/V/D and urinary problems. Pt states the last time she had pain like this she had an ovarian cyst.

## 2020-09-20 NOTE — Discharge Instructions (Addendum)
Your workup was reassuring today. Your urine did appear slightly infectious today and we will treat you as if you having a urinary tract infection. We have sent your urine for culture and will call you in 2-3 days time if the antibiotic needs to be changed.   Please follow up with your PCP for reevaluation next week.   Take Ibuprofen and Tylenol as needed for pain  Return to the ED for any worsening symptoms

## 2020-09-21 MED FILL — CEPHALEXIN 500 MG CAPSULE: 500 | 5 days supply | Qty: 10 | Fill #0

## 2020-09-22 LAB — URINE CULTURE: Culture: >=100000 — AB

## 2020-10-05 ENCOUNTER — Telehealth (INDEPENDENT_AMBULATORY_CARE_PROVIDER_SITE_OTHER): Payer: Self-pay | Admitting: Primary Care

## 2020-11-01 ENCOUNTER — Encounter (HOSPITAL_COMMUNITY): Payer: Self-pay

## 2020-11-01 ENCOUNTER — Ambulatory Visit (HOSPITAL_COMMUNITY)
Admission: EM | Admit: 2020-11-01 | Discharge: 2020-11-01 | Disposition: A | Discharge status: Home / Self Care | Payer: Self-pay | Attending: Urgent Care | Admitting: Urgent Care

## 2020-11-01 ENCOUNTER — Other Ambulatory Visit: Payer: Self-pay

## 2020-11-01 ENCOUNTER — Other Ambulatory Visit (HOSPITAL_COMMUNITY): Payer: Self-pay | Admitting: Urgent Care

## 2020-11-01 DIAGNOSIS — L02416 Cutaneous abscess of left lower limb: Secondary | ICD-10-CM

## 2020-11-01 DIAGNOSIS — M25552 Pain in left hip: Secondary | ICD-10-CM

## 2020-11-01 MED ORDER — NAPROXEN 500 MG PO TABS: 500.0000 mg | ORAL_TABLET | Freq: Two times a day (BID) | ORAL | 0 refills | Status: DC

## 2020-11-01 MED ORDER — DOXYCYCLINE HYCLATE 100 MG PO CAPS: 100.0000 mg | ORAL_CAPSULE | Freq: Two times a day (BID) | ORAL | 0 refills | Status: DC

## 2020-11-01 MED FILL — DOXYCYCLINE HYCLATE 100 MG: 100 | 10 days supply | Qty: 20 | Fill #0

## 2020-11-01 NOTE — ED Provider Notes (Signed)
[facility]   MRN: [id number] DOB: [birth year]  Subjective:   Wanda Crane is a 30 y.o. female presenting for 1 week history of persistent left thigh abscess. Patient reports that she has a history of these and mostly pop on their own. This one was really large and popped in her sleep. Has still been getting drainage spontaneously. Is trying to keep it covered. Denies fever, n/v.   No current facility-administered medications for this encounter.  Current Outpatient Medications:  .  escitalopram (LEXAPRO) 20 MG tablet, Take 1 tablet (20 mg total) by mouth daily., Disp: 90 tablet, Rfl: 1 .  Melatonin 10 MG TABS, Take 10 mg by mouth at bedtime as needed. (Patient not taking: No sig reported), Disp: 90 tablet, Rfl: 1   No Known Allergies  Past Medical History:  Diagnosis Date  . Chronic right-sided low back pain with right-sided sciatica 04/13/2017  . Hemorrhagic cyst of ovary   . Spondylolisthesis, grade 1 11/27/2016     History reviewed. No pertinent surgical history.  Family History  Problem Relation Age of Onset  . Asthma Mother   . COPD Mother   . Diabetes Maternal Grandmother   . Heart disease Maternal Grandmother   . Stroke Maternal Grandmother   . Diabetes Maternal Grandfather   . Heart disease Maternal Grandfather   . Stroke Maternal Grandfather     Social History   Tobacco Use  . Smoking status: Never Smoker  . Smokeless tobacco: Never Used  Vaping Use  . Vaping Use: Never used  Substance Use Topics  . Alcohol use: No  . Drug use: No    ROS   Objective:   Vitals: BP (!) 108/58   Pulse 72   Temp 99.1 F (37.3 C)   Resp 18   LMP 10/09/2020 (Approximate)   SpO2 100%   Physical Exam Constitutional:      General: She is not in acute distress.    Appearance: Normal appearance. She is well-developed. She is obese. She is not ill-appearing, toxic-appearing or diaphoretic.  HENT:     Head: Normocephalic and atraumatic.     Nose: Nose  normal.     Mouth/Throat:     Mouth: Mucous membranes are moist.     Pharynx: Oropharynx is clear.  Eyes:     General: No scleral icterus.       Right eye: No discharge.        Left eye: No discharge.     Extraocular Movements: Extraocular movements intact.     Conjunctiva/sclera: Conjunctivae normal.     Pupils: Pupils are equal, round, and reactive to light.  Cardiovascular:     Rate and Rhythm: Normal rate.  Pulmonary:     Effort: Pulmonary effort is normal.  Musculoskeletal:       Legs:  Skin:    General: Skin is warm and dry.  Neurological:     General: No focal deficit present.     Mental Status: She is alert and oriented to person, place, and time.  Psychiatric:        Mood and Affect: Mood normal.        Behavior: Behavior normal.        Thought Content: Thought content normal.        Judgment: Judgment normal.     Assessment and Plan :   I have reviewed the PDMP during this encounter.  1. Abscess of left thigh   2. Pain in joint involving  left pelvic region and thigh     Wound care reviewed.  Start doxycycline for the abscess, naproxen for pain and inflammation. Counseled patient on potential for adverse effects with medications prescribed/recommended today, ER and return-to-clinic precautions discussed, patient verbalized understanding.    Jaynee Eagles, [place] 11/01/20 1545

## 2020-11-01 NOTE — Discharge Instructions (Signed)
Please change your dressing 2-3 times daily. Do not apply any ointments or creams. Each time you change your dressing, make sure you clean gently around the perimeter of the wound with gentle soap and warm water. Pat your wound dry and let it air out if possible for 1-2 hours before reapplying another dressing.

## 2020-11-01 NOTE — ED Triage Notes (Signed)
Pt in with c/o left thigh abscess that has been there for 1 week  states she noticed a small amount of drainage, but now it's just an open wound

## 2020-11-09 ENCOUNTER — Other Ambulatory Visit: Payer: Self-pay

## 2020-11-09 ENCOUNTER — Ambulatory Visit (INDEPENDENT_AMBULATORY_CARE_PROVIDER_SITE_OTHER): Payer: Self-pay | Admitting: Primary Care

## 2020-11-09 ENCOUNTER — Encounter (INDEPENDENT_AMBULATORY_CARE_PROVIDER_SITE_OTHER): Payer: Self-pay | Admitting: Primary Care

## 2020-11-09 VITALS — BP 94/67 | HR 75 | Temp 97.3°F | Ht 63.0 in | Wt 205.6 lb

## 2020-11-09 DIAGNOSIS — Z09 Encounter for follow-up examination after completed treatment for conditions other than malignant neoplasm: Secondary | ICD-10-CM

## 2020-11-09 DIAGNOSIS — L02416 Cutaneous abscess of left lower limb: Secondary | ICD-10-CM

## 2020-11-09 DIAGNOSIS — F418 Other specified anxiety disorders: Secondary | ICD-10-CM

## 2020-11-09 DIAGNOSIS — J301 Allergic rhinitis due to pollen: Secondary | ICD-10-CM

## 2020-11-09 DIAGNOSIS — Z131 Encounter for screening for diabetes mellitus: Secondary | ICD-10-CM

## 2020-11-09 LAB — POCT GLYCOSYLATED HEMOGLOBIN (HGB A1C): Hemoglobin A1C: 5.5 % (ref 4.0–5.6)

## 2020-11-09 MED ORDER — BUPROPION HCL ER (XL) 150 MG PO TB24
150.0000 mg | ORAL_TABLET | Freq: Every day | ORAL | 3 refills | Status: DC
  Filled 2020-11-09: qty 30, 30d supply, fill #0

## 2020-11-09 MED ORDER — FEXOFENADINE-PSEUDOEPHED ER 180-240 MG PO TB24: 1.0000 | ORAL_TABLET | Freq: Every day | ORAL | 1 refills | Status: DC

## 2020-11-09 NOTE — Progress Notes (Signed)
Established Patient Office Visit  Subjective:  Patient ID: Wanda Crane, female    DOB: March 31, 1991  Age: 30 y.o. MRN: [id number]  CC:  Chief Complaint  Patient presents with  . sore    Abcess     HPI [name] is a 30 year old obese female  presents for follow-up on 11/01/20 from the Urgent care for abscess to 6 change the over the liver the loss Bulldogs on thigh she was treated with antibiotics. She denied any chills no signs and symptoms of infection at this time.  She has been getting these frequently will rule out other causative factors as diabetes.  She also has a rhinitis for a month .  1 week history of persistent left thigh abscess. Patient reports that she has a history of these and mostly pop on their own. This one was really large and popped in her sleep.  Continued  been getting drainage spontaneously. Is trying to keep it covered. Denies fever, n/v.  Past Medical History:  Diagnosis Date  . Chronic right-sided low back pain with right-sided sciatica 04/13/2017  . Hemorrhagic cyst of ovary   . Spondylolisthesis, grade 1 11/27/2016    History reviewed. No pertinent surgical history.  Family History  Problem Relation Age of Onset  . Asthma Mother   . COPD Mother   . Diabetes Maternal Grandmother   . Heart disease Maternal Grandmother   . Stroke Maternal Grandmother   . Diabetes Maternal Grandfather   . Heart disease Maternal Grandfather   . Stroke Maternal Grandfather     Social History   Socioeconomic History  . Marital status: Single    Spouse name: Not on file  . Number of children: Not on file  . Years of education: Not on file  . Highest education level: Not on file  Occupational History  . Not on file  Tobacco Use  . Smoking status: Never Smoker  . Smokeless tobacco: Never Used  Vaping Use  . Vaping Use: Never used  Substance and Sexual Activity  . Alcohol use: No  . Drug use: No  . Sexual activity: Yes    Birth control/protection: None   Other Topics Concern  . Not on file  Social History Narrative  . Not on file   Social Determinants of Health   Financial Resource Strain: Not on file  Food Insecurity: Not on file  Transportation Needs: Not on file  Physical Activity: Not on file  Stress: Not on file  Social Connections: Not on file  Intimate Partner Violence: Not on file    Outpatient Medications Prior to Visit  Medication Sig Dispense Refill  . cephALEXin (KEFLEX) 500 MG capsule TAKE 1 CAPSULE (500 MG TOTAL) BY MOUTH 2 (TWO) TIMES DAILY FOR 5 DAYS. 10 capsule 0  . doxycycline (VIBRA-TABS) 100 MG tablet TAKE 1 CAPSULE (100 MG TOTAL) BY MOUTH 2 (TWO) TIMES DAILY. 20 tablet 0  . doxycycline (VIBRAMYCIN) 100 MG capsule Take 1 capsule (100 mg total) by mouth 2 (two) times daily. 20 capsule 0  . escitalopram (LEXAPRO) 20 MG tablet TAKE 1 TABLET (20 MG TOTAL) BY MOUTH DAILY. 90 tablet 1  . naproxen (NAPROSYN) 500 MG tablet TAKE 1 TABLET (500 MG TOTAL) BY MOUTH 2 (TWO) TIMES DAILY WITH A MEAL. 30 tablet 0  . Melatonin 10 MG TABS Take 10 mg by mouth at bedtime as needed. (Patient not taking: Reported on 11/09/2020) 90 tablet 1   No facility-administered medications prior to visit.  No Known Allergies  ROS Review of Systems  HENT: Positive for rhinorrhea and sneezing.   Eyes: Positive for itching.  Psychiatric/Behavioral:       Depression      Objective:    Physical Exam Vitals reviewed.  Constitutional:      Appearance: She is obese.  HENT:     Head: Normocephalic.     Right Ear: Tympanic membrane and external ear normal.     Left Ear: Tympanic membrane and external ear normal.     Nose: Nose normal.  Eyes:     Extraocular Movements: Extraocular movements intact.     Pupils: Pupils are equal, round, and reactive to light.  Cardiovascular:     Rate and Rhythm: Normal rate and regular rhythm.  Pulmonary:     Effort: Pulmonary effort is normal.     Breath sounds: Normal breath sounds.  Abdominal:      General: Bowel sounds are normal. There is distension.     Palpations: Abdomen is soft.  Musculoskeletal:        General: Normal range of motion.     Cervical back: Normal range of motion and neck supple.  Skin:    General: Skin is warm and dry.  Neurological:     Mental Status: She is alert and oriented to person, place, and time.  Psychiatric:        Mood and Affect: Mood normal.        Behavior: Behavior normal.        Thought Content: Thought content normal.        Judgment: Judgment normal.     BP 94/67 (BP Location: Right Arm, Patient Position: Sitting, Cuff Size: Large)   Pulse 75   Temp (!) 97.3 F (36.3 C) (Temporal)   Ht 5\' 3"  (1.6 m)   Wt 205 lb 9.6 oz (93.3 kg)   LMP 11/07/2020 (Exact Date)   SpO2 96%   BMI 36.42 kg/m  Wt Readings from Last 3 Encounters:  11/09/20 205 lb 9.6 oz (93.3 kg)  09/10/20 216 lb (98 kg)  06/22/20 223 lb 6.4 oz (101.3 kg)     Health Maintenance Due  Topic Date Due  . COVID-19 Vaccine (1) Never done  . PAP SMEAR-Modifier  09/19/2018    There are no preventive care reminders to display for this patient.  Lab Results  Component Value Date   TSH 0.522 04/19/2015   Lab Results  Component Value Date   WBC 12.4 (H) 09/20/2020   HGB 12.9 09/20/2020   HCT 43.6 09/20/2020   MCV 85.3 09/20/2020   PLT 379 09/20/2020   Lab Results  Component Value Date   NA 138 09/20/2020   K 3.9 09/20/2020   CO2 21 (L) 09/20/2020   GLUCOSE 141 (H) 09/20/2020   BUN 9 09/20/2020   CREATININE 0.81 09/20/2020   BILITOT 0.7 09/20/2020   ALKPHOS 58 09/20/2020   AST 26 09/20/2020   ALT 28 09/20/2020   PROT 8.1 09/20/2020   ALBUMIN 4.2 09/20/2020   CALCIUM 9.5 09/20/2020   ANIONGAP 13 09/20/2020     Assessment & Plan:  Wanda Crane was seen today for sore.  Diagnoses and all orders for this visit:  Screening for diabetes mellitus -     HgB A1c 5.5 per ADA guidelines she is not a diabetic . Discussed Prediabetes is A1C 5.7 - start now watching  foods that are high in carbohydrates are the following rice, potatoes, breads, sugars, and pastas.  Reduction  in the intake (eating) will assist in lowering your blood sugars.  Abscess of left thigh Healing no sounds of s/s of infection. Small opening no drainage but because inside of thighs frictions can impede healing advised to use abt ointment and a bandage and when home allow area to breath nothing on or around area. Will continue to monitor   Depression with anxiety Flowsheet Row Office Visit from 11/09/2020 in [facility]  PHQ-9 Total Score 24     Generalized anxiety disorder (GAD) is a mental health condition that involves worry that is not triggered by a specific event.  People with GAD often worry excessively about many things in their lives, such as their health and family. Start Wellbutrin XL 150 mg may also help with loss weight . Information provided on AVS regarding medication  -     buPROPion (WELLBUTRIN XL) 150 MG 24 hr tablet; Take 1 tablet (150 mg total) by mouth daily.  Seasonal allergic rhinitis due to pollen High Pollen trees, flowers blooming and grass maintenance can be triggers with BP continuously low add -pseudoephedrine. Explain must show driving licence and prescription will allow 30 day supply.  -     fexofenadine-pseudoephedrine (ALLEGRA-D 24) 180-240 MG 24 hr tablet; Take 1 tablet by mouth daily.  Hospital discharge follow-up Per Urgent care records 2 cm open draining wound erythema  some induration around perimeter tx with doxycycline for the abscess, naproxen for pain and inflammation. Healing well no s/s of infection   Meds ordered this encounter  Medications  . fexofenadine-pseudoephedrine (ALLEGRA-D 24) 180-240 MG 24 hr tablet    Sig: Take 1 tablet by mouth daily.    Dispense:  60 tablet    Refill:  1  . buPROPion (WELLBUTRIN XL) 150 MG 24 hr tablet    Sig: Take 1 tablet (150 mg total) by mouth daily.    Dispense:  30 tablet     Refill:  3    Follow-up: Return in about 2 weeks (around 11/23/2020) for medication management .    Kerin Perna, NP

## 2020-11-09 NOTE — Patient Instructions (Addendum)
[email or web address]">  Generalized Anxiety Disorder, Adult Generalized anxiety disorder (GAD) is a mental health condition. Unlike normal worries, anxiety related to GAD is not triggered by a specific event. These worries do not fade or get better with time. GAD interferes with relationships, work, and school. GAD symptoms can vary from mild to severe. People with severe GAD can have intense waves of anxiety with physical symptoms that are similar to panic attacks. What are the causes? The exact cause of GAD is not known, but the following are believed to have an impact:  Differences in natural brain chemicals.  Genes passed down from parents to children.  Differences in the way threats are perceived.  Development during childhood.  Personality. What increases the risk? The following factors may make you more likely to develop this condition:  Being female.  Having a family history of anxiety disorders.  Being very shy.  Experiencing very stressful life events, such as the death of a loved one.  Having a very stressful family environment. What are the signs or symptoms? People with GAD often worry excessively about many things in their lives, such as their health and family. Symptoms may also include:  Mental and emotional symptoms: ? Worrying excessively about natural disasters. ? Fear of being late. ? Difficulty concentrating. ? Fears that others are judging your performance.  Physical symptoms: ? Fatigue. ? Headaches, muscle tension, muscle twitches, trembling, or feeling shaky. ? Feeling like your heart is pounding or beating very fast. ? Feeling out of breath or like you cannot take a deep breath. ? Having trouble falling asleep or staying asleep, or experiencing restlessness. ? Sweating. ? Nausea, diarrhea, or irritable bowel syndrome (IBS).  Behavioral symptoms: ? Experiencing erratic moods or irritability. ? Avoidance of new situations. ? Avoidance of  people. ? Extreme difficulty making decisions. How is this diagnosed? This condition is diagnosed based on your symptoms and medical history. You will also have a physical exam. Your health care provider may perform tests to rule out other possible causes of your symptoms. To be diagnosed with GAD, a person must have anxiety that:  Is out of his or her control.  Affects several different aspects of his or her life, such as work and relationships.  Causes distress that makes him or her unable to take part in normal activities.  Includes at least three symptoms of GAD, such as restlessness, fatigue, trouble concentrating, irritability, muscle tension, or sleep problems. Before your health care provider can confirm a diagnosis of GAD, these symptoms must be present more days than they are not, and they must last for 6 months or longer. How is this treated? This condition may be treated with:  Medicine. Antidepressant medicine is usually prescribed for long-term daily control. Anti-anxiety medicines may be added in severe cases, especially when panic attacks occur.  Talk therapy (psychotherapy). Certain types of talk therapy can be helpful in treating GAD by providing support, education, and guidance. Options include: ? Cognitive behavioral therapy (CBT). People learn coping skills and self-calming techniques to ease their physical symptoms. They learn to identify unrealistic thoughts and behaviors and to replace them with more appropriate thoughts and behaviors. ? Acceptance and commitment therapy (ACT). This treatment teaches people how to be mindful as a way to cope with unwanted thoughts and feelings. ? Biofeedback. This process trains you to manage your body's response (physiological response) through breathing techniques and relaxation methods. You will work with a therapist while machines are used to monitor your physical   symptoms.  Stress management techniques. These include yoga,  meditation, and exercise. A mental health specialist can help determine which treatment is best for you. Some people see improvement with one type of therapy. However, other people require a combination of therapies.   Follow these instructions at home: Lifestyle  Maintain a consistent routine and schedule.  Anticipate stressful situations. Create a plan, and allow extra time to work with your plan.  Practice stress management or self-calming techniques that you have learned from your therapist or your health care provider. General instructions  Take over-the-counter and prescription medicines only as told by your health care provider.  Understand that you are likely to have setbacks. Accept this and be kind to yourself as you persist to take better care of yourself.  Recognize and accept your accomplishments, even if you judge them as small.  Keep all follow-up visits as told by your health care provider. This is important. Contact a health care provider if:  Your symptoms do not get better.  Your symptoms get worse.  You have signs of depression, such as: ? A persistently sad or irritable mood. ? Loss of enjoyment in activities that used to bring you joy. ? Change in weight or eating. ? Changes in sleeping habits. ? Avoiding friends or family members. ? Loss of energy for normal tasks. ? Feelings of guilt or worthlessness. Get help right away if:  You have serious thoughts about hurting yourself or others. If you ever feel like you may hurt yourself or others, or have thoughts about taking your own life, get help right away. Go to your nearest emergency department or:  Call your local emergency services (911 in the [place]  Call a suicide crisis helpline, such as the [place] at (548)016-3974. This is open 24 hours a day in the [place]  Text the Crisis Text Line at 6843388794 (in the [place] Summary  GAD may cause symptoms such as restlessness,  trouble concentrating, sleep problems, frequent sweating, nausea, diarrhea, headaches, and trembling or muscle twitching.  A mental health specialist can help determine which treatment is best for you. Some people see improvement with one type of therapy. However, other people require a combination of therapies. This information is not intended to replace advice given to you by your health care provider. Make sure you discuss any questions you have with your health care provider. Document Revised: 05/14/2019 Document Reviewed: 05/14/2019 Elsevier Patient Education  2021 [place]. Bupropion extended-release tablets (Depression/Mood Disorders) What is this medicine? BUPROPION (byoo PROE pee on) is used to treat depression. This medicine may be used for other purposes; ask your health care provider or pharmacist if you have questions. COMMON BRAND NAME(S): Aplenzin, Budeprion XL, Forfivo XL, Wellbutrin XL What should I tell my health care provider before I take this medicine? They need to know if you have any of these conditions:  an eating disorder, such as anorexia or bulimia  bipolar disorder or psychosis  diabetes or high blood sugar, treated with medication  glaucoma  head injury or brain tumor  heart disease, previous heart attack, or irregular heart beat  high blood pressure  kidney or liver disease  seizures (convulsions)  suicidal thoughts or a previous suicide attempt  Tourette's syndrome  weight loss  an unusual or allergic reaction to bupropion, other medicines, foods, dyes, or preservatives  breast-feeding  pregnant or trying to become pregnant How should I use this medicine? Take this medicine by mouth with a glass  of water. Follow the directions on the prescription label. You can take it with or without food. If it upsets your stomach, take it with food. Do not crush, chew, or cut these tablets. This medicine is taken once daily at the same time each day.  Do not take your medicine more often than directed. Do not stop taking this medicine suddenly except upon the advice of your doctor. Stopping this medicine too quickly may cause serious side effects or your condition may worsen. A special MedGuide will be given to you by the pharmacist with each prescription and refill. Be sure to read this information carefully each time. Talk to your pediatrician regarding the use of this medicine in children. Special care may be needed. Overdosage: If you think you have taken too much of this medicine contact a poison control center or emergency room at once. NOTE: This medicine is only for you. Do not share this medicine with others. What if I miss a dose? If you miss a dose, skip the missed dose and take your next tablet at the regular time. Do not take double or extra doses. What may interact with this medicine? Do not take this medicine with any of the following medications:  linezolid  MAOIs like Azilect, Carbex, Eldepryl, Marplan, Nardil, and Parnate  methylene blue (injected into a vein)  other medicines that contain bupropion like Zyban This medicine may also interact with the following medications:  alcohol  certain medicines for anxiety or sleep  certain medicines for blood pressure like metoprolol, propranolol  certain medicines for depression or psychotic disturbances  certain medicines for HIV or AIDS like efavirenz, lopinavir, nelfinavir, ritonavir  certain medicines for irregular heart beat like propafenone, flecainide  certain medicines for Parkinson's disease like amantadine, levodopa  certain medicines for seizures like carbamazepine, phenytoin, phenobarbital  cimetidine  clopidogrel  cyclophosphamide  digoxin  furazolidone  isoniazid  nicotine  orphenadrine  procarbazine  steroid medicines like prednisone or cortisone  stimulant medicines for attention disorders, weight loss, or to stay  awake  tamoxifen  theophylline  thiotepa  ticlopidine  tramadol  warfarin This list may not describe all possible interactions. Give your health care provider a list of all the medicines, herbs, non-prescription drugs, or dietary supplements you use. Also tell them if you smoke, drink alcohol, or use illegal drugs. Some items may interact with your medicine. What should I watch for while using this medicine? Tell your doctor if your symptoms do not get better or if they get worse. Visit your doctor or healthcare provider for regular checks on your progress. Because it may take several weeks to see the full effects of this medicine, it is important to continue your treatment as prescribed by your doctor. This medicine may cause serious skin reactions. They can happen weeks to months after starting the medicine. Contact your healthcare provider right away if you notice fevers or flu-like symptoms with a rash. The rash may be red or purple and then turn into blisters or peeling of the skin. Or, you might notice a red rash with swelling of the face, lips or lymph nodes in your neck or under your arms. Patients and their families should watch out for new or worsening thoughts of suicide or depression. Also watch out for sudden changes in feelings such as feeling anxious, agitated, panicky, irritable, hostile, aggressive, impulsive, severely restless, overly excited and hyperactive, or not being able to sleep. If this happens, especially at the beginning of treatment or  after a change in dose, call your healthcare provider. Avoid alcoholic drinks while taking this medicine. Drinking large amounts of alcoholic beverages, using sleeping or anxiety medicines, or quickly stopping the use of these agents while taking this medicine may increase your risk for a seizure. Do not drive or use heavy machinery until you know how this medicine affects you. This medicine can impair your ability to perform these  tasks. Do not take this medicine close to bedtime. It may prevent you from sleeping. Your mouth may get dry. Chewing sugarless gum or sucking hard candy, and drinking plenty of water may help. Contact your doctor if the problem does not go away or is severe. The tablet shell for some brands of this medicine does not dissolve. This is normal. The tablet shell may appear whole in the stool. This is not a cause for concern. What side effects may I notice from receiving this medicine? Side effects that you should report to your doctor or health care professional as soon as possible:  allergic reactions like skin rash, itching or hives, swelling of the face, lips, or tongue  breathing problems  changes in vision  confusion  elevated mood, decreased need for sleep, racing thoughts, impulsive behavior  fast or irregular heartbeat  hallucinations, loss of contact with reality  increased blood pressure  rash, fever, and swollen lymph nodes  redness, blistering, peeling or loosening of the skin, including inside the mouth  seizures  suicidal thoughts or other mood changes  unusually weak or tired  vomiting Side effects that usually do not require medical attention (report to your doctor or health care professional if they continue or are bothersome):  constipation  headache  loss of appetite  nausea  tremors  weight loss This list may not describe all possible side effects. Call your doctor for medical advice about side effects. You may report side effects to FDA at 1-800-FDA-1088. Where should I keep my medicine? Keep out of the reach of children. Store at room temperature between 15 and 30 degrees C (59 and 86 degrees F). Throw away any unused medicine after the expiration date. NOTE: This sheet is a summary. It may not cover all possible information. If you have questions about this medicine, talk to your doctor, pharmacist, or health care provider.  2021 Elsevier/Gold  Standard (2018-10-17 13:45:31)

## 2020-11-10 ENCOUNTER — Other Ambulatory Visit: Payer: Self-pay

## 2020-11-23 ENCOUNTER — Other Ambulatory Visit: Payer: Self-pay

## 2020-11-23 ENCOUNTER — Ambulatory Visit (INDEPENDENT_AMBULATORY_CARE_PROVIDER_SITE_OTHER): Payer: Self-pay | Admitting: Primary Care

## 2020-11-23 ENCOUNTER — Encounter (INDEPENDENT_AMBULATORY_CARE_PROVIDER_SITE_OTHER): Payer: Self-pay | Admitting: Primary Care

## 2020-11-23 DIAGNOSIS — F331 Major depressive disorder, recurrent, moderate: Secondary | ICD-10-CM

## 2020-11-23 DIAGNOSIS — F418 Other specified anxiety disorders: Secondary | ICD-10-CM

## 2020-11-23 MED ORDER — ESCITALOPRAM OXALATE 20 MG PO TABS
ORAL_TABLET | Freq: Every day | ORAL | 1 refills | Status: DC
  Filled 2020-11-23: qty 30, 30d supply, fill #0

## 2020-11-23 MED ORDER — BUPROPION HCL ER (XL) 150 MG PO TB24
150.0000 mg | ORAL_TABLET | Freq: Every day | ORAL | 3 refills | Status: DC
  Filled 2020-11-23: qty 30, 30d supply, fill #0

## 2020-11-23 NOTE — Patient Instructions (Signed)

## 2020-11-23 NOTE — Progress Notes (Signed)
[name]   Established Patient Office Visit  Subjective:  Patient ID: Wanda Crane, female    DOB: [birth year]  Age: 30 y.o. MRN: [id number]  CC:  Chief Complaint  Patient presents with  . Medication Management    HPI Wanda Crane is a 30 year old female who presents for medication refills.  Her concern is the Lexapro is no longer helping as much as it did when she first started taking it.  She voices no other complaints or concerns  Past Medical History:  Diagnosis Date  . Chronic right-sided low back pain with right-sided sciatica 04/13/2017  . Hemorrhagic cyst of ovary   . Spondylolisthesis, grade 1 11/27/2016    History reviewed. No pertinent surgical history.  Family History  Problem Relation Age of Onset  . Asthma Mother   . COPD Mother   . Diabetes Maternal Grandmother   . Heart disease Maternal Grandmother   . Stroke Maternal Grandmother   . Diabetes Maternal Grandfather   . Heart disease Maternal Grandfather   . Stroke Maternal Grandfather     Social History   Socioeconomic History  . Marital status: Single    Spouse name: Not on file  . Number of children: Not on file  . Years of education: Not on file  . Highest education level: Not on file  Occupational History  . Not on file  Tobacco Use  . Smoking status: Never Smoker  . Smokeless tobacco: Never Used  Vaping Use  . Vaping Use: Never used  Substance and Sexual Activity  . Alcohol use: No  . Drug use: No  . Sexual activity: Yes    Birth control/protection: None  Other Topics Concern  . Not on file  Social History Narrative  . Not on file   Social Determinants of Health   Financial Resource Strain: Not on file  Food Insecurity: Not on file  Transportation Needs: Not on file  Physical Activity: Not on file  Stress: Not on file  Social Connections: Not on file  Intimate Partner Violence: Not on file    Outpatient Medications Prior to Visit  Medication Sig Dispense Refill  . cephALEXin  (KEFLEX) 500 MG capsule TAKE 1 CAPSULE (500 MG TOTAL) BY MOUTH 2 (TWO) TIMES DAILY FOR 5 DAYS. 10 capsule 0  . doxycycline (VIBRA-TABS) 100 MG tablet TAKE 1 CAPSULE (100 MG TOTAL) BY MOUTH 2 (TWO) TIMES DAILY. 20 tablet 0  . doxycycline (VIBRAMYCIN) 100 MG capsule Take 1 capsule (100 mg total) by mouth 2 (two) times daily. 20 capsule 0  . fexofenadine-pseudoephedrine (ALLEGRA-D 24) 180-240 MG 24 hr tablet Take 1 tablet by mouth daily. 60 tablet 1  . buPROPion (WELLBUTRIN XL) 150 MG 24 hr tablet Take 1 tablet (150 mg total) by mouth daily. 30 tablet 3  . escitalopram (LEXAPRO) 20 MG tablet TAKE 1 TABLET (20 MG TOTAL) BY MOUTH DAILY. 90 tablet 1  . Melatonin 10 MG TABS Take 10 mg by mouth at bedtime as needed. (Patient not taking: Reported on 11/23/2020) 90 tablet 1  . naproxen (NAPROSYN) 500 MG tablet TAKE 1 TABLET (500 MG TOTAL) BY MOUTH 2 (TWO) TIMES DAILY WITH A MEAL. 30 tablet 0   No facility-administered medications prior to visit.    No Known Allergies  ROS Review of Systems  Psychiatric/Behavioral:       Depression  All other systems reviewed and are negative.     Objective:    Physical Exam Vitals reviewed.  Constitutional:      Appearance:  Normal appearance.  HENT:     Head: Normocephalic.     Right Ear: Tympanic membrane and external ear normal.     Left Ear: Tympanic membrane and external ear normal.     Nose: Nose normal.  Eyes:     Extraocular Movements: Extraocular movements intact.     Pupils: Pupils are equal, round, and reactive to light.  Cardiovascular:     Rate and Rhythm: Normal rate and regular rhythm.  Pulmonary:     Effort: Pulmonary effort is normal.     Breath sounds: Normal breath sounds.  Abdominal:     General: Bowel sounds are normal.     Palpations: Abdomen is soft.  Musculoskeletal:        General: Normal range of motion.     Cervical back: Normal range of motion.  Skin:    General: Skin is warm and dry.  Neurological:     Mental Status:  She is alert and oriented to person, place, and time.  Psychiatric:        Mood and Affect: Mood normal.        Behavior: Behavior normal.        Thought Content: Thought content normal.        Judgment: Judgment normal.     BP (!) 97/59 (BP Location: Right Arm, Patient Position: Sitting, Cuff Size: Normal)   Pulse 68   Temp (!) 97.3 F (36.3 C) (Temporal)   Wt 204 lb 9.6 oz (92.8 kg)   LMP 11/07/2020 (Exact Date)   SpO2 94%   BMI 36.24 kg/m  Wt Readings from Last 3 Encounters:  11/23/20 204 lb 9.6 oz (92.8 kg)  11/09/20 205 lb 9.6 oz (93.3 kg)  09/10/20 216 lb (98 kg)     Health Maintenance Due  Topic Date Due  . COVID-19 Vaccine (1) Never done  . PAP SMEAR-Modifier  09/19/2018    There are no preventive care reminders to display for this patient.  Lab Results  Component Value Date   TSH 0.522 04/19/2015   Lab Results  Component Value Date   WBC 12.4 (H) 09/20/2020   HGB 12.9 09/20/2020   HCT 43.6 09/20/2020   MCV 85.3 09/20/2020   PLT 379 09/20/2020   Lab Results  Component Value Date   NA 138 09/20/2020   K 3.9 09/20/2020   CO2 21 (L) 09/20/2020   GLUCOSE 141 (H) 09/20/2020   BUN 9 09/20/2020   CREATININE 0.81 09/20/2020   BILITOT 0.7 09/20/2020   ALKPHOS 58 09/20/2020   AST 26 09/20/2020   ALT 28 09/20/2020   PROT 8.1 09/20/2020   ALBUMIN 4.2 09/20/2020   CALCIUM 9.5 09/20/2020   ANIONGAP 13 09/20/2020   No results found for: CHOL No results found for: HDL No results found for: LDLCALC No results found for: TRIG No results found for: [facility] Lab Results  Component Value Date   HGBA1C 5.5 11/09/2020      Assessment & Plan:   Nusayba was seen today for medication management.  Diagnoses and all orders for this visit:  Moderate episode of recurrent major depressive disorder ([place]) [place] Office Visit from 11/09/2020 in [place]  PHQ-2 Total Score 6    Score does not indicate that Lexapro is no longer  effective.  Discussed with patient adding another medication with this one as adjunct therapy which can or may improve her depression and anxiety.  At first she was reluctant to taking another pill  then decided she did not want to continue feeling the way she was and agreed to take Wellbutrin. -     escitalopram (LEXAPRO) 20 MG tablet; TAKE 1 TABLET (20 MG TOTAL) BY MOUTH DAILY.  Depression with anxiety Added Wellbutrin as adjunct therapy with Lexapro to enhance effects -     buPROPion (WELLBUTRIN XL) 150 MG 24 hr tablet; Take 1 tablet (150 mg total) by mouth daily.    Meds ordered this encounter  Medications  . escitalopram (LEXAPRO) 20 MG tablet    Sig: TAKE 1 TABLET (20 MG TOTAL) BY MOUTH DAILY.    Dispense:  90 tablet    Refill:  1  . buPROPion (WELLBUTRIN XL) 150 MG 24 hr tablet    Sig: Take 1 tablet (150 mg total) by mouth daily.    Dispense:  30 tablet    Refill:  3    Follow-up: Return for medication follow up .    Kerin Perna, NP

## 2020-11-24 ENCOUNTER — Other Ambulatory Visit: Payer: Self-pay

## 2020-12-01 ENCOUNTER — Other Ambulatory Visit: Payer: Self-pay

## 2020-12-24 ENCOUNTER — Other Ambulatory Visit: Payer: Self-pay

## 2020-12-24 ENCOUNTER — Ambulatory Visit (HOSPITAL_COMMUNITY)
Admission: EM | Admit: 2020-12-24 | Discharge: 2020-12-24 | Disposition: A | Discharge status: Home / Self Care | Payer: Self-pay | Attending: Medical Oncology | Admitting: Medical Oncology

## 2020-12-24 ENCOUNTER — Encounter (HOSPITAL_COMMUNITY): Payer: Self-pay | Admitting: Emergency Medicine

## 2020-12-24 DIAGNOSIS — K029 Dental caries, unspecified: Secondary | ICD-10-CM

## 2020-12-24 DIAGNOSIS — K047 Periapical abscess without sinus: Secondary | ICD-10-CM

## 2020-12-24 MED ORDER — AMOXICILLIN-POT CLAVULANATE 875-125 MG PO TABS
1.0000 | ORAL_TABLET | Freq: Two times a day (BID) | ORAL | 0 refills | Status: DC
  Filled 2020-12-24: qty 14, 7d supply, fill #0

## 2020-12-24 MED ORDER — IBUPROFEN 600 MG PO TABS
600.0000 mg | ORAL_TABLET | Freq: Four times a day (QID) | ORAL | 0 refills | Status: DC | PRN
  Filled 2020-12-24: qty 30, 8d supply, fill #0

## 2020-12-24 NOTE — ED Triage Notes (Signed)
Pt presents with front upper dental pain xs 3 days.

## 2020-12-24 NOTE — ED Provider Notes (Signed)
[facility]    CSN: [id number] Arrival date & time: 12/24/20  1444      History   Chief Complaint Chief Complaint  Patient presents with  . Dental Problem    HPI Wanda Crane is a 30 y.o. female.   HPI  Dental Pain: Pt reports with a three day history of dental pain. Pain is located in her front upper dental pain. She has tried warm wash clothe, tylenol applications for symptoms with little relief. She denies fevers, trouble breathing or swallowing. She reports that she does not have a current dentist.    Past Medical History:  Diagnosis Date  . Chronic right-sided low back pain with right-sided sciatica 04/13/2017  . Hemorrhagic cyst of ovary   . Spondylolisthesis, grade 1 11/27/2016    Patient Active Problem List   Diagnosis Date Noted  . Chronic right-sided low back pain with right-sided sciatica 04/13/2017  . Spondylolisthesis, grade 1 11/27/2016    History reviewed. No pertinent surgical history.  OB History    Gravida  0   Para  0   Term  0   Preterm  0   AB  0   Living  0     SAB  0   IAB  0   Ectopic  0   Multiple  0   Live Births  0            Home Medications    Prior to Admission medications   Medication Sig Start Date End Date Taking? Authorizing Provider  buPROPion (WELLBUTRIN XL) 150 MG 24 hr tablet Take 1 tablet (150 mg total) by mouth daily. 11/23/20   Kerin Perna, NP  cephALEXin (KEFLEX) 500 MG capsule TAKE 1 CAPSULE (500 MG TOTAL) BY MOUTH 2 (TWO) TIMES DAILY FOR 5 DAYS. 09/20/20 09/20/21  Eustaquio Maize, PA-C  doxycycline (VIBRA-TABS) 100 MG tablet TAKE 1 CAPSULE (100 MG TOTAL) BY MOUTH 2 (TWO) TIMES DAILY. 11/01/20 11/01/21  Jaynee Eagles, PA-C  doxycycline (VIBRAMYCIN) 100 MG capsule Take 1 capsule (100 mg total) by mouth 2 (two) times daily. 11/01/20   Jaynee Eagles, PA-C  escitalopram (LEXAPRO) 20 MG tablet TAKE 1 TABLET (20 MG TOTAL) BY MOUTH DAILY. 11/23/20 11/23/21  Kerin Perna, NP   fexofenadine-pseudoephedrine (ALLEGRA-D 24) 180-240 MG 24 hr tablet Take 1 tablet by mouth daily. 11/09/20   Kerin Perna, NP  Melatonin 10 MG TABS Take 10 mg by mouth at bedtime as needed. Patient not taking: Reported on 11/23/2020 06/22/20   Kerin Perna, NP    Family History Family History  Problem Relation Age of Onset  . Asthma Mother   . COPD Mother   . Diabetes Maternal Grandmother   . Heart disease Maternal Grandmother   . Stroke Maternal Grandmother   . Diabetes Maternal Grandfather   . Heart disease Maternal Grandfather   . Stroke Maternal Grandfather     Social History Social History   Tobacco Use  . Smoking status: Never Smoker  . Smokeless tobacco: Never Used  Vaping Use  . Vaping Use: Never used  Substance Use Topics  . Alcohol use: No  . Drug use: No     Allergies   Patient has no known allergies.   Review of Systems Review of Systems  As stated above in HPI Physical Exam Triage Vital Signs ED Triage Vitals  Enc Vitals Group     BP 12/24/20 1512 111/76     Pulse Rate 12/24/20 1512 62  Resp 12/24/20 1512 16     Temp 12/24/20 1512 98.6 F (37 C)     Temp Source 12/24/20 1512 Oral     SpO2 12/24/20 1512 98 %     Weight --      Height --      Head Circumference --      Peak Flow --      Pain Score 12/24/20 1510 10     Pain Loc --      Pain Edu? --      Excl. in [place]? --    No data found.  Updated Vital Signs BP 111/76 (BP Location: Right Arm)   Pulse 62   Temp 98.6 F (37 C) (Oral)   Resp 16   LMP 12/19/2020   SpO2 98%   Physical Exam Vitals and nursing note reviewed.  Constitutional:      General: She is not in acute distress.    Appearance: Normal appearance. She is not ill-appearing, toxic-appearing or diaphoretic.  HENT:     Nose: Nose normal.     Mouth/Throat:     Lips: Pink.     Mouth: Mucous membranes are moist. No oral lesions or angioedema.     Dentition: Abnormal dentition. Dental tenderness, gingival  swelling, dental caries, dental abscesses and gum lesions present.     Tongue: No lesions. Tongue does not deviate from midline.     Palate: No mass and lesions.     Pharynx: Oropharynx is clear. Uvula midline. No pharyngeal swelling, oropharyngeal exudate, posterior oropharyngeal erythema or uvula swelling.     Tonsils: No tonsillar exudate or tonsillar abscesses.      Comments: Poor dentition. No tenderness, bogginess or edema of superior or inferior palate  Cardiovascular:     Rate and Rhythm: Normal rate and regular rhythm.     Heart sounds: Normal heart sounds.  Pulmonary:     Effort: Pulmonary effort is normal.     Breath sounds: Normal breath sounds.  Neurological:     Mental Status: She is alert.      UC Treatments / Results  Labs (all labs ordered are listed, but only abnormal results are displayed) Labs Reviewed - No data to display  EKG   Radiology No results found.  Procedures Procedures (including critical care time)  Medications Ordered in UC Medications - No data to display  Initial Impression / Assessment and Plan / UC Course  I have reviewed the triage vital signs and the nursing notes.  Pertinent labs & imaging results that were available during my care of the patient were reviewed by me and considered in my medical decision making (see chart for details).     New. Treating with Augmentin to prevent complications such as Ludwigs Angina. Discussed how to use along with common potential side effects and precautions. Discussed the importance of dental care and follow up.     Final Clinical Impressions(s) / UC Diagnoses   Final diagnoses:  None   Discharge Instructions   None    ED Prescriptions    None     PDMP not reviewed this encounter.   Hughie Closs, [place] 12/24/20 1536

## 2020-12-24 NOTE — Discharge Instructions (Addendum)
Please follow up with a dentist listed in the handout given in person today within 10 days.

## 2021-01-12 IMAGING — CR DG CHEST 2V
2 series · 2 of 2 positions shown · non-contrast
Comparison: 05/25/2014

CLINICAL DATA: Cough

EXAM:
CHEST - 2 VIEW

[chest pa]
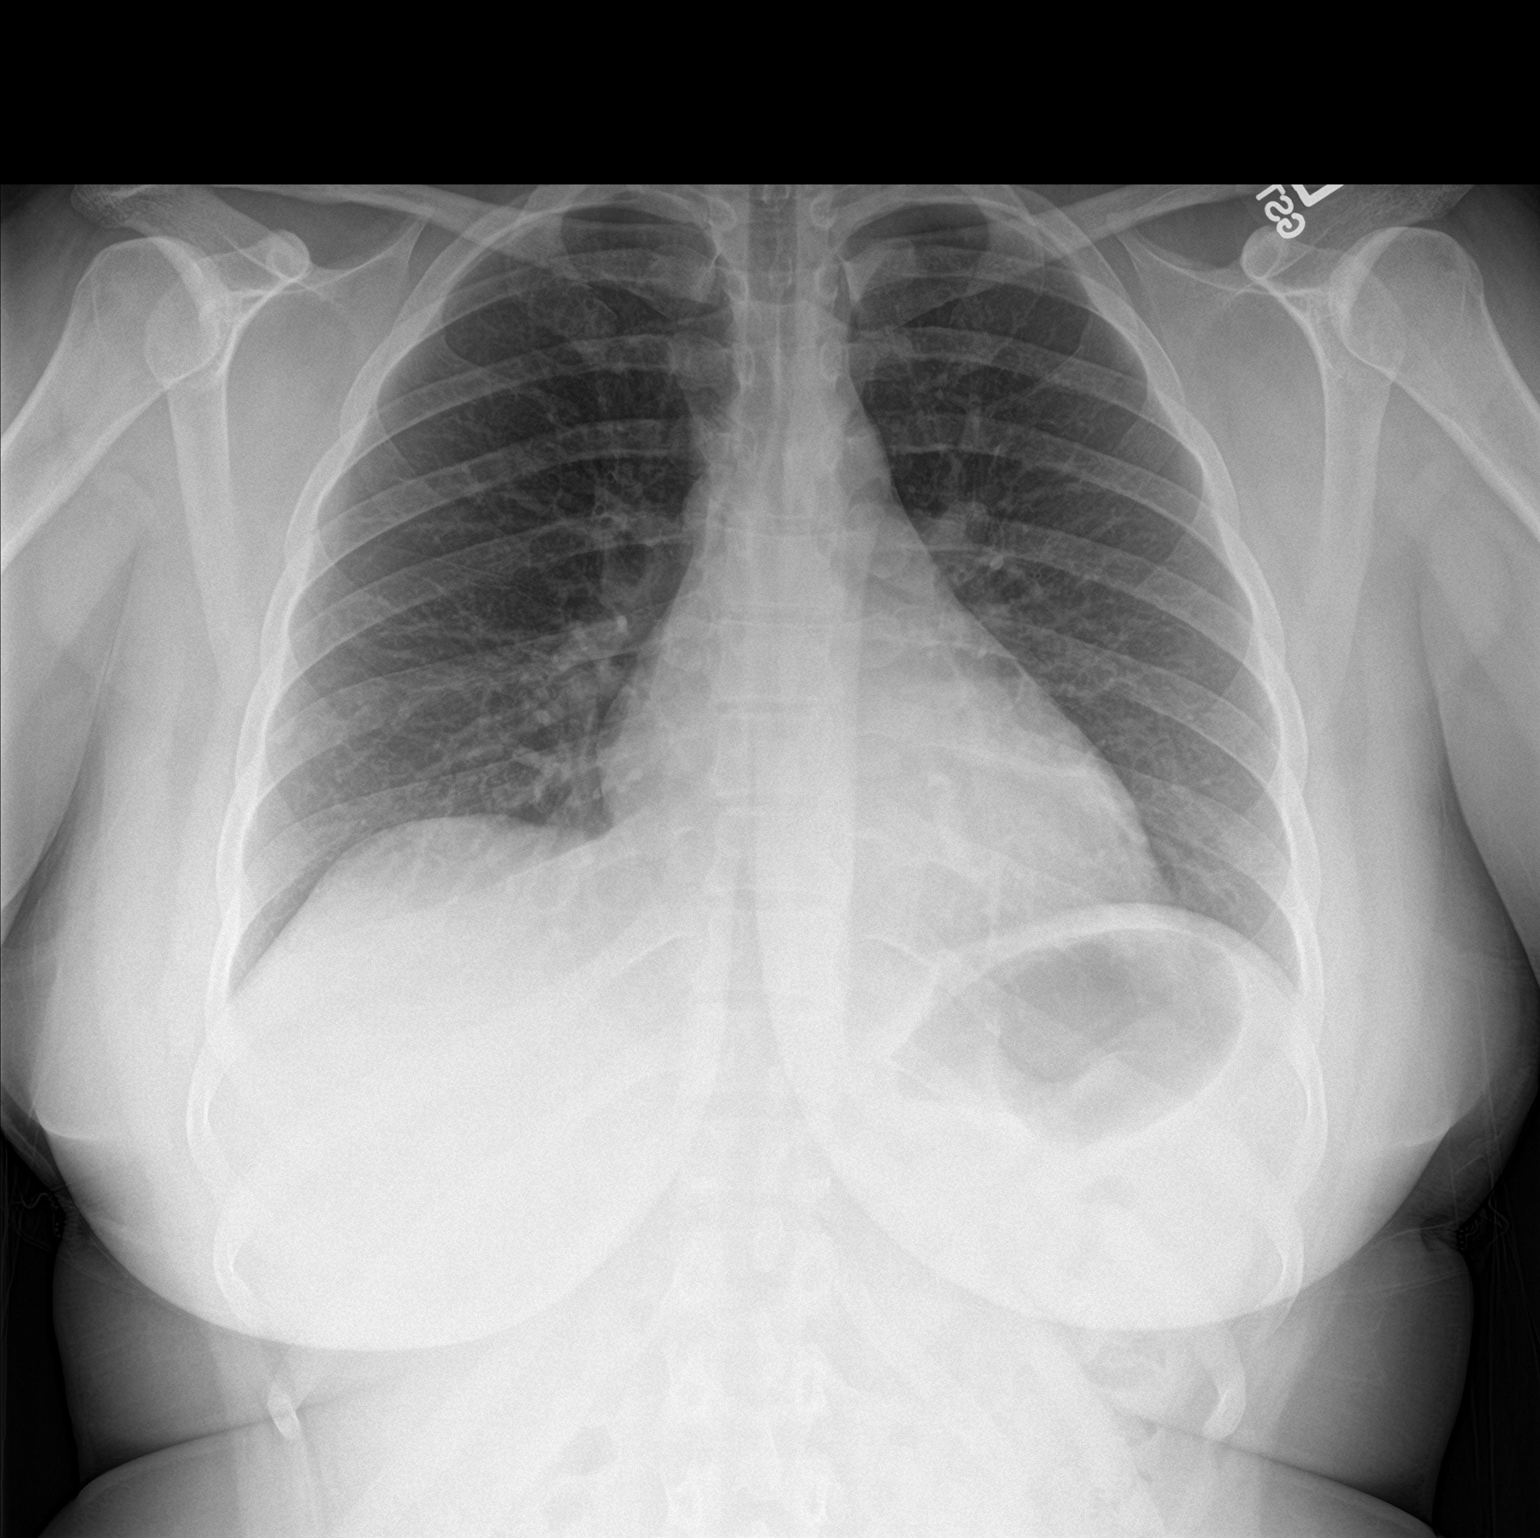

[chest lat]
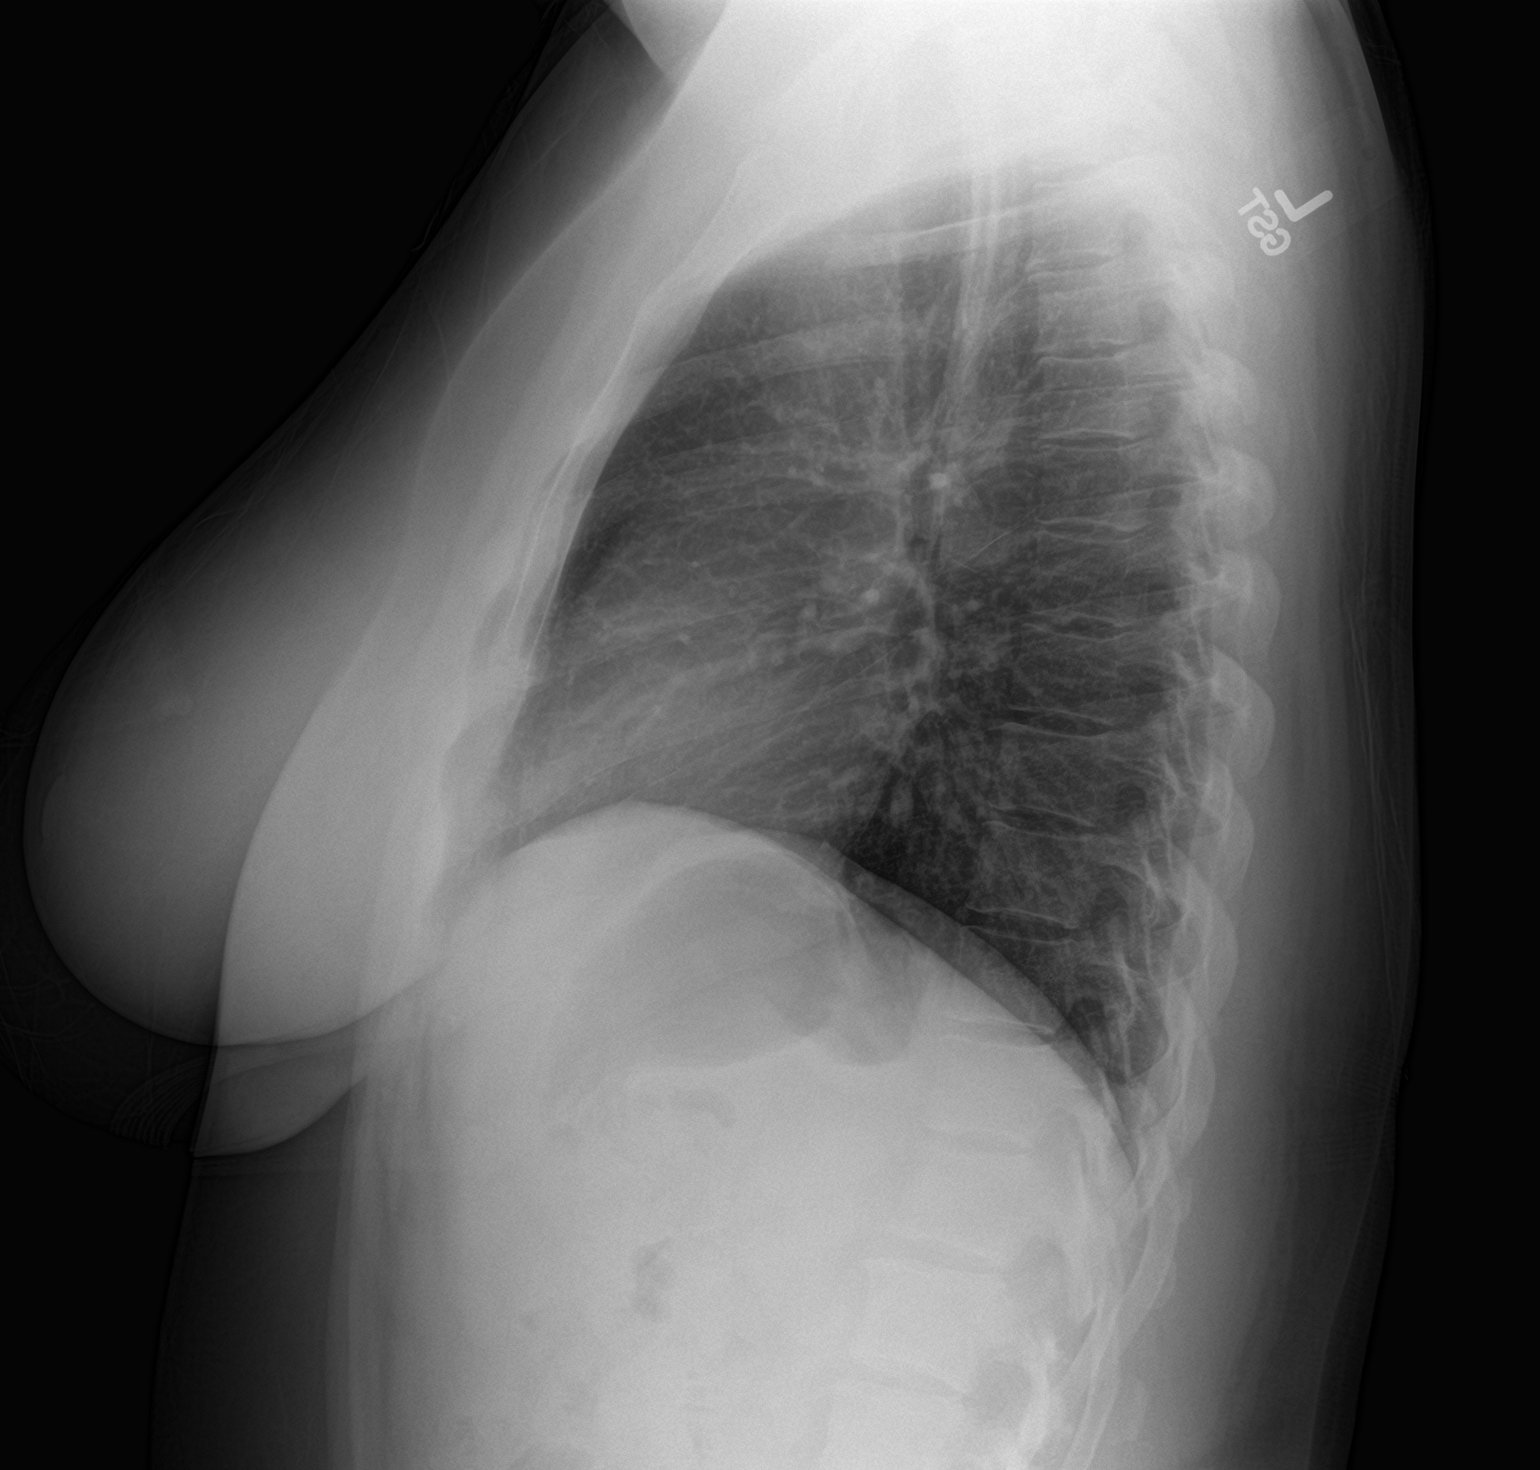

[2 of 2 positions shown; findings below may reference images not displayed]

FINDINGS: Normal heart size and mediastinal contours. No acute infiltrate or
edema. No effusion or pneumothorax. No acute osseous findings.
IMPRESSION: Negative chest.

## 2021-01-25 ENCOUNTER — Other Ambulatory Visit: Payer: Self-pay

## 2021-01-25 ENCOUNTER — Telehealth (INDEPENDENT_AMBULATORY_CARE_PROVIDER_SITE_OTHER): Payer: Self-pay | Admitting: Primary Care

## 2021-01-25 DIAGNOSIS — F418 Other specified anxiety disorders: Secondary | ICD-10-CM

## 2021-02-06 NOTE — Progress Notes (Signed)
  [facility]  Virtual Visit via Telephone Note  I connected with Marciana Uplinger, on 02/06/2021 at 10:42 PM through an audio and video application or by telephone and verified that I am speaking with the correct person using two identifiers.   Consent: I discussed the limitations, risks, security and privacy concerns of performing an evaluation and management service by telephone and the availability of in person appointments. I also discussed with the patient that there may be a patient responsible charge related to this service. The patient expressed understanding and agreed to proceed.   Location of Patient: Home  Location of Provider:  [facility] at [facility]   Persons participating in Telemedicine visit: Si Raider,  NP      History of Present Illness: Ms. Margurette Brener is a 30 year old female who is in need of mental health management - she suffers from depression denies harm to self or others . Currently she is on Wellbutrin and Lexapro with little to no affect.   Past Medical History:  Diagnosis Date   Chronic right-sided low back pain with right-sided sciatica 04/13/2017   Hemorrhagic cyst of ovary    Spondylolisthesis, grade 1 11/27/2016   No Known Allergies  Current Outpatient Medications on File Prior to Visit  Medication Sig Dispense Refill   amoxicillin-clavulanate (AUGMENTIN) 875-125 MG tablet Take 1 tablet by mouth every 12 (twelve) hours. 14 tablet 0   buPROPion (WELLBUTRIN XL) 150 MG 24 hr tablet Take 1 tablet (150 mg total) by mouth daily. 30 tablet 3   escitalopram (LEXAPRO) 20 MG tablet TAKE 1 TABLET (20 MG TOTAL) BY MOUTH DAILY. 90 tablet 1   fexofenadine-pseudoephedrine (ALLEGRA-D 24) 180-240 MG 24 hr tablet Take 1 tablet by mouth daily. 60 tablet 1   ibuprofen (ADVIL) 600 MG tablet Take 1 tablet (600 mg total) by mouth every 6 (six) hours as needed. Take with food and water and stop  should you have any abdominal pain, bloody or dark vomiting or stools 30 tablet 0   No current facility-administered medications on file prior to visit.    Observations/Objective: She has some of these side effects from depression.  increase in sleeping, eating or  loss of interest in doing things that once gave you pleasure. Feeling worthless, guilty, nervous and low self esteem, avoiding interaction with other people or increase agitation.   Assessment and Plan: Diagnoses and all orders for this visit:  Depression with anxiety Continue Lexapro and Wellbutrin do not stop medication suddenly -     Ambulatory referral to Psychiatry    Follow Up Instructions:    I discussed the assessment and treatment plan with the patient. The patient was provided an opportunity to ask questions and all were answered. The patient agreed with the plan and demonstrated an understanding of the instructions.   The patient was advised to call back or seek an in-person evaluation if the symptoms worsen or if the condition fails to improve as anticipated.     I provided 10 minutes total of non-face-to-face time during this encounter including median intraservice time, reviewing previous notes, investigations, ordering medications, medical decision making, coordinating care and patient verbalized understanding at the end of the visit.    This note has been created with Surveyor, quantity. Any transcriptional errors are unintentional.   Kerin Perna, NP 02/06/2021, 10:42 PM

## 2021-02-22 ENCOUNTER — Other Ambulatory Visit: Payer: Self-pay

## 2021-02-22 ENCOUNTER — Ambulatory Visit: Payer: Self-pay | Attending: Primary Care

## 2021-02-25 ENCOUNTER — Ambulatory Visit: Payer: Self-pay

## 2021-04-04 ENCOUNTER — Ambulatory Visit (INDEPENDENT_AMBULATORY_CARE_PROVIDER_SITE_OTHER): Payer: No Payment, Other | Admitting: Clinical

## 2021-04-04 ENCOUNTER — Other Ambulatory Visit: Payer: Self-pay

## 2021-04-04 DIAGNOSIS — F4321 Adjustment disorder with depressed mood: Secondary | ICD-10-CM
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
## 2021-04-19 ENCOUNTER — Ambulatory Visit: Payer: Self-pay | Admitting: Physical Medicine & Rehabilitation

## 2021-04-20 ENCOUNTER — Other Ambulatory Visit: Payer: Self-pay

## 2021-04-20 ENCOUNTER — Ambulatory Visit (INDEPENDENT_AMBULATORY_CARE_PROVIDER_SITE_OTHER): Payer: No Payment, Other | Admitting: Psychiatry

## 2021-04-20 ENCOUNTER — Encounter (HOSPITAL_COMMUNITY): Payer: Self-pay | Admitting: Psychiatry

## 2021-04-20 VITALS — BP 110/67 | HR 73 | Ht 63.0 in | Wt 203.0 lb

## 2021-04-20 DIAGNOSIS — F331 Major depressive disorder, recurrent, moderate: Secondary | ICD-10-CM | POA: Diagnosis not present

## 2021-04-20 MED ORDER — FLUOXETINE HCL 10 MG PO CAPS
10.0000 mg | ORAL_CAPSULE | Freq: Every day | ORAL | 2 refills | Status: DC
  Filled 2021-04-20: qty 30, 30d supply, fill #0
  Filled 2021-05-23 – 2021-05-31 (×2): qty 30, 30d supply, fill #1

## 2021-04-20 MED ORDER — GABAPENTIN 100 MG PO CAPS
100.0000 mg | ORAL_CAPSULE | Freq: Three times a day (TID) | ORAL | 2 refills | Status: DC
  Filled 2021-04-20: qty 90, 30d supply, fill #0
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
## 2021-04-21 ENCOUNTER — Encounter: Payer: Self-pay | Admitting: Physical Medicine & Rehabilitation

## 2021-04-21 ENCOUNTER — Encounter: Payer: Self-pay | Attending: Physical Medicine & Rehabilitation | Admitting: Physical Medicine & Rehabilitation

## 2021-04-21 ENCOUNTER — Other Ambulatory Visit: Payer: Self-pay

## 2021-04-21 VITALS — BP 103/70 | HR 83 | Temp 98.7°F | Ht 63.0 in | Wt 203.0 lb

## 2021-04-21 DIAGNOSIS — G894 Chronic pain syndrome: Secondary | ICD-10-CM

## 2021-04-21 DIAGNOSIS — Q068 Other specified congenital malformations of spinal cord: Secondary | ICD-10-CM

## 2021-04-21 DIAGNOSIS — Z5181 Encounter for therapeutic drug level monitoring: Secondary | ICD-10-CM

## 2021-04-21 DIAGNOSIS — Z79899 Other long term (current) drug therapy: Secondary | ICD-10-CM

## 2021-04-21 MED ORDER — TRAMADOL HCL 50 MG PO TABS
50.0000 mg | ORAL_TABLET | Freq: Two times a day (BID) | ORAL | 2 refills | Status: DC | PRN
  Filled 2021-04-21: qty 60, 30d supply, fill #0

## 2021-04-21 MED ORDER — DIAZEPAM 10 MG PO TABS
10.0000 mg | ORAL_TABLET | Freq: Once | ORAL | 0 refills | Status: DC
  Filled 2021-04-21: qty 1, 1d supply, fill #0

## 2021-04-21 NOTE — Progress Notes (Signed)
Subjective:    Patient ID: Wanda Crane, female    DOB: 05/18/1991, 30 y.o.   MRN: [id number]  HPI CC:  Low back pain  30 yo female with L5-S1 spondylolisthesis with hx spina bifida occulta with tethered cord. No LE symptoms at last visit almost 2 yrs ago The patient states that now she intermittently has right lateral leg pain which feels like it radiates down from the back area.   Patient has had no new bowel or bladder dysfunction no lower extremity weakness.  She remains independent with ADLs and mobility. She has had a lot of psychologic stressors, has followed up closely with behavioral health. Previously the patient underwent right L3-L4-L5 medial branch blocks under fluoroscopic guidance in 2019 without much improvement in her low back pain complaints Last seen in clinic November 2020, at which time the patient was having some symptoms suggestive of sacroiliac disorder.  CLINICAL DATA:  Low back pain. BILATERAL leg pain. Symptoms for 7 years.   EXAM: MRI LUMBAR SPINE WITHOUT CONTRAST   TECHNIQUE: Multiplanar, multisequence MR imaging of the lumbar spine was performed. No intravenous contrast was administered.   COMPARISON:  Lumbar spine radiographs 11/13/2016.   FINDINGS: Segmentation:  Standard.   Alignment:  3 mm grade 1 anterolisthesis L5-S1   Vertebrae: BILATERAL L5 spondylolysis. No worrisome osseous lesion. No significant bone marrow edema at L4-5 or L5-S1.   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 See series 11, image 10 for reference. The visualized thoracic cord, as well as the conus medullaris has an overall normal signal.   Paraspinal and other soft tissues: Unremarkable. Non worrisome appearing renal cystic disease.   Disc levels:    L1-L2:  Normal.   L2-L3:  Normal.   L3-L4:  Disc desiccation. Annular bulge. No impingement.   L4-L5:  Annular bulge. Facet arthropathy. No impingement.   L5-S1: 3 mm anterolisthesis secondary to BILATERAL L5 pars defects. No stenosis or subarticular zone narrowing. BILATERAL foraminal narrowing due to slip, and uncovering of the disc narrows both neural foramina, slightly worse on the RIGHT.   IMPRESSION: Grade 1 anterolisthesis L5-S1 secondary to BILATERAL L5 spondylolysis. BILATERAL foraminal narrowing potentially affects both L5 nerve roots, slightly worse on the RIGHT.   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 Neurosurgical consultation may be warranted.   IT IS IMPORTANT, IF LUMBAR MYELOGRAPHY IS REQUESTED, THAT SPECIFIC REFERENCE TO LOW TETHERED CORD AND DIASTEMATOMYELIA IS INCLUDED IN THE CLINICAL DATA, TO AVOID INADVERTENT CORD INJURY DURING LUMBAR PUNCTURE.     Electronically Signed   By: Staci Righter M.D.   On: 06/28/2018 07:04 Pain Inventory Average Pain 10 Pain Right Now 10 My pain is constant, sharp, burning, stabbing, tingling, and aching  In the last 24 hours, has pain interfered with the following? General activity 10 Relation with others 10 Enjoyment of life 10 What TIME of day is your pain at its worst? morning , daytime, evening, and night Sleep (in general) Poor  Pain is worse with: walking, bending, sitting, inactivity, standing, and some activites Pain improves with: medication and injections Relief from Meds: 4  Family History  Problem Relation Age of Onset   Asthma Mother  COPD Mother    Diabetes Maternal Grandmother    Heart disease Maternal Grandmother    Stroke Maternal Grandmother    Diabetes Maternal Grandfather    Heart disease Maternal Grandfather    Stroke Maternal Grandfather     Social History   Socioeconomic History   Marital status: Single    Spouse name: Not on file   Number of children: Not on file   Years of education: Not on file   Highest education level: Not on file  Occupational History   Not on file  Tobacco Use   Smoking status: Never   Smokeless tobacco: Never  Vaping Use   Vaping Use: Never used  Substance and Sexual Activity   Alcohol use: No   Drug use: No   Sexual activity: Yes    Birth control/protection: None  Other Topics Concern   Not on file  Social History Narrative   Not on file   Social Determinants of Health   Financial Resource Strain: Not on file  Food Insecurity: Not on file  Transportation Needs: Not on file  Physical Activity: Not on file  Stress: Not on file  Social Connections: Not on file   History reviewed. No pertinent surgical history. History reviewed. No pertinent surgical history. Past Medical History:  Diagnosis Date   Chronic right-sided low back pain with right-sided sciatica 04/13/2017   Hemorrhagic cyst of ovary    Spondylolisthesis, grade 1 11/27/2016   BP 103/70   Pulse 83   Temp 98.7 F (37.1 C)   Ht '5\' 3"'$  (1.6 m)   Wt 203 lb (92.1 kg)   SpO2 98%   BMI 35.96 kg/m   Opioid Risk Score:   Fall Risk Score:  `1  Depression screen PHQ 2/9  Depression screen [facility] 2/9 11/09/2020 09/10/2020 07/09/2020 06/22/2020 06/17/2019 06/24/2018 05/27/2018  Decreased Interest '3 3 1 2 '$ 0 3 3  Down, Depressed, Hopeless '3 3 2 2 1 3 3  '$ PHQ - 2 Score '6 6 3 4 1 6 6  '$ Altered sleeping '3 3 3 3 '$ - 3 3  Tired, decreased energy '3 3 3 3 '$ - 3 3  Change in appetite '3 3 3 3 '$ - 3 3  Feeling bad or failure about yourself  '3 3 2 3 '$ - 3 3  Trouble concentrating '3 3 2 2 '$ - 3 3  Moving slowly or fidgety/restless '3 3 2 1 '$ - 1 3  Suicidal thoughts 0 0 0 1 - 0 0  PHQ-9 Score '24 24 18 20 '$ - 22 24  Difficult doing work/chores Very difficult - - - - - -  Some encounter information is confidential and restricted. Go to Review Flowsheets  activity to see all data.  Some recent data might be hidden    Review of Systems  Musculoskeletal:  Positive for back pain.  Psychiatric/Behavioral:         Deprssion  All other systems reviewed and are negative.     Objective:   Physical Exam Vitals and nursing note reviewed.  Constitutional:      Appearance: Normal appearance. She is obese.  HENT:     Head: Normocephalic and atraumatic.  Eyes:     Extraocular Movements: Extraocular movements intact.     Conjunctiva/sclera: Conjunctivae normal.     Pupils: Pupils are equal, round, and reactive to light.  Neurological:     General: No focal deficit present.     Mental Status: She is alert and oriented to person, place, and  time.  Psychiatric:        Mood and Affect: Mood normal.        Behavior: Behavior normal.  Motor strength is 5/5 bilateral deltoid, bicep, tricep, grip, hip flexor, knee extensor, ankle dorsiflexion plantar flexor Negative straight leg raising bilaterally Sensation intact to light touch and pinprick bilateral L2-L3-L4 L5-S1 distribution. Deep tendon reflexes are absent bilateral patellar and bilateral Achilles. Gait without evidence of toe drag or knee stability. Lumbar spine has normal range of motion with flexion extension is limited to 50% mild tenderness palpation along the lumbar paraspinals starting at L4 through lumbosacral junction.  Some PSIS tenderness as well Positive thigh thrust test on the left side negative Faber's bilaterally negative distraction test        Assessment & Plan:   #1.  Chronic right-sided greater than left-sided low back pain.  She has grade 1 spondylolisthesis as well as spina bifida occulta with tethered cord on imaging studies but very little in terms of neurologic deficit.  She has had some increased right lower extremity discomfort and given her prior imaging studies would recommend repeat lumbar MRI to further evaluate. The patient has not been taking any of her pain  medications for many months, will check UDS and write prescription for tramadol 50 mg twice daily May need to update controlled substance agreement.  She may continue her gabapentin as well I will see her back in 1 month to review imaging studies

## 2021-04-27 ENCOUNTER — Inpatient Hospital Stay: Admission: RE | Admit: 2021-04-27 | Payer: Self-pay | Source: Ambulatory Visit

## 2021-04-27 LAB — TOXASSURE SELECT,+ANTIDEPR,UR

## 2021-04-28 ENCOUNTER — Telehealth: Payer: Self-pay | Admitting: *Deleted

## 2021-04-28 ENCOUNTER — Other Ambulatory Visit: Payer: Self-pay

## 2021-04-28 ENCOUNTER — Encounter: Payer: Self-pay | Admitting: *Deleted

## 2021-04-28 NOTE — Telephone Encounter (Signed)
Urine drug screen is positive for prescribed Tramadol but is also positive for norfentanyl metabolite of Fentanyl which is not prescribed. Per Dr Letta Pate she will be non narcotic treatment only. Tramadol prescribed on 04/21/21 was filled but 2 refills have been canceled. A Mychart message has been sent informing her of the non narcotic treatment only.

## 2021-04-29 ENCOUNTER — Ambulatory Visit
Admission: RE | Admit: 2021-04-29 | Discharge: 2021-04-29 | Disposition: A | Discharge status: Home / Self Care | Payer: No Typology Code available for payment source | Source: Ambulatory Visit | Attending: Physical Medicine & Rehabilitation | Admitting: Physical Medicine & Rehabilitation

## 2021-04-29 ENCOUNTER — Other Ambulatory Visit: Payer: Self-pay

## 2021-04-29 DIAGNOSIS — Q068 Other specified congenital malformations of spinal cord: Secondary | ICD-10-CM

## 2021-05-11 ENCOUNTER — Ambulatory Visit (HOSPITAL_COMMUNITY): Payer: No Payment, Other | Admitting: Clinical

## 2021-05-17 ENCOUNTER — Other Ambulatory Visit: Payer: Self-pay

## 2021-05-17 MED ORDER — AMOXICILLIN 500 MG PO CAPS
500.0000 mg | ORAL_CAPSULE | Freq: Three times a day (TID) | ORAL | 0 refills | Status: DC
  Filled 2021-05-17: qty 30, 10d supply, fill #0

## 2021-05-18 ENCOUNTER — Ambulatory Visit (INDEPENDENT_AMBULATORY_CARE_PROVIDER_SITE_OTHER): Payer: Self-pay | Admitting: Primary Care

## 2021-05-20 NOTE — Telephone Encounter (Signed)
-----   Message from Charlett Blake, MD sent at 05/11/2021  9:33 AM EDT ----- Please notify pt of non narcotic only due to UDS result, if she wants to change next visit to video visit to go over MRI that is fine.  May suggest [facility] as another potential clinic  ----- Message ----- From: Lavone Neri Lab Results In Sent: 04/27/2021   9:36 AM EDT To: Charlett Blake, MD

## 2021-05-23 ENCOUNTER — Other Ambulatory Visit: Payer: Self-pay

## 2021-05-24 ENCOUNTER — Other Ambulatory Visit: Payer: Self-pay

## 2021-05-30 ENCOUNTER — Other Ambulatory Visit: Payer: Self-pay

## 2021-05-31 ENCOUNTER — Other Ambulatory Visit: Payer: Self-pay

## 2021-05-31 ENCOUNTER — Ambulatory Visit (INDEPENDENT_AMBULATORY_CARE_PROVIDER_SITE_OTHER): Payer: Self-pay | Admitting: Primary Care

## 2021-05-31 ENCOUNTER — Ambulatory Visit (INDEPENDENT_AMBULATORY_CARE_PROVIDER_SITE_OTHER): Payer: Self-pay | Admitting: *Deleted

## 2021-05-31 ENCOUNTER — Ambulatory Visit (HOSPITAL_COMMUNITY): Payer: Self-pay | Admitting: Clinical

## 2021-05-31 ENCOUNTER — Encounter (INDEPENDENT_AMBULATORY_CARE_PROVIDER_SITE_OTHER): Payer: Self-pay | Admitting: Primary Care

## 2021-05-31 VITALS — BP 90/68 | HR 79 | Temp 97.5°F | Resp 16 | Wt 199.0 lb

## 2021-05-31 DIAGNOSIS — M25561 Pain in right knee: Secondary | ICD-10-CM

## 2021-05-31 DIAGNOSIS — Z3202 Encounter for pregnancy test, result negative: Secondary | ICD-10-CM

## 2021-05-31 DIAGNOSIS — R6889 Other general symptoms and signs: Secondary | ICD-10-CM

## 2021-05-31 DIAGNOSIS — R630 Anorexia: Secondary | ICD-10-CM

## 2021-05-31 DIAGNOSIS — M25562 Pain in left knee: Secondary | ICD-10-CM

## 2021-05-31 LAB — POCT URINE PREGNANCY: Preg Test, Ur: NEGATIVE

## 2021-05-31 NOTE — Telephone Encounter (Signed)
Reason for Disposition  [1] MODERATE leg swelling (e.g., swelling extends up to knees) AND [2] new-onset or worsening  Answer Assessment - Initial Assessment Questions 1. ONSET: "When did the swelling start?" (e.g., minutes, hours, days)     Pt calling in c/o swelling in both legs.   I soaked in Epsom salts last night.   I elevated them.   I took a couple of days off work due to the swelling.   The top of my foot is itching.    I have an indention in my feet due to the swelling.  2. LOCATION: "What part of the leg is swollen?"  "Are both legs swollen or just one leg?"     They both are swollen.   My feet and up to my knees are swollen.  My right knee is hurting too.   I'm having restlessness in my legs.     3. SEVERITY: "How bad is the swelling?" (e.g., localized; mild, moderate, severe)  - Localized - small area of swelling localized to one leg  - MILD pedal edema - swelling limited to foot and ankle, pitting edema < 1/4 inch (6 mm) deep, rest and elevation eliminate most or all swelling  - MODERATE edema - swelling of lower leg to knee, pitting edema > 1/4 inch (6 mm) deep, rest and elevation only partially reduce swelling  - SEVERE edema - swelling extends above knee, facial or hand swelling present      Having pedal edema. 4. REDNESS: "Does the swelling look red or infected?"     Normal color 5. PAIN: "Is the swelling painful to touch?" If Yes, ask: "How painful is it?"   (Scale 1-10; mild, moderate or severe)     No pain   I can barely walk due to a tingling sensation in my legs. 6. FEVER: "Do you have a fever?" If Yes, ask: "What is it, how was it measured, and when did it start?"      No 7. CAUSE: "What do you think is causing the leg swelling?"     My iron may be low from what I read on [employer]. 8. MEDICAL HISTORY: "Do you have a history of heart failure, kidney disease, liver failure, or cancer?"     No    9. RECURRENT SYMPTOM: "Have you had leg swelling before?" If Yes, ask: "When  was the last time?" "What happened that time?"     Yes  I was on my feet 24 hrs straight.    The swelling went down the next day when I was off. 10. OTHER SYMPTOMS: "Do you have any other symptoms?" (e.g., chest pain, difficulty breathing)       No 11. PREGNANCY: "Is there any chance you are pregnant?" "When was your last menstrual period?"       I don't know maybe  Protocols used: Leg Swelling and Edema-A-AH

## 2021-05-31 NOTE — Progress Notes (Signed)
  [facility]      HPI Mrs. Wanda Crane 30 y.o. obese female presents for an acute visit.  She has noticed approximately 2 to 3 days her right foot was painful and then she noticed bilateral swelling of her knees to her ankles but doe not hurt.   She denies any falls, traumas or any known injuries.  She denies any localized redness, warmth  temperatures or any new areas on the her legs. She has steps which she has to use daily - this is not a new activity. She will see her psychiatrist tomorrow to evaluation depression and anxiety.    Past Medical History:  Diagnosis Date   Chronic right-sided low back pain with right-sided sciatica 04/13/2017   Hemorrhagic cyst of ovary    Spondylolisthesis, grade 1 11/27/2016     No Known Allergies    Current Outpatient Medications on File Prior to Visit  Medication Sig Dispense Refill   FLUoxetine (PROZAC) 10 MG capsule Take 1 capsule (10 mg total) by mouth daily. 30 capsule 2   gabapentin (NEURONTIN) 100 MG capsule Take 1 capsule (100 mg total) by mouth 3 (three) times daily. (Patient not taking: Reported on 04/21/2021) 90 capsule 2   No current facility-administered medications on file prior to visit.   Abcess riht op 16 den ROS: all negative except above.   Physical Exam: Filed Weights   05/31/21 1338  Weight: 199 lb (90.3 kg)   BP 90/68   Pulse 79   Temp (!) 97.5 F (36.4 C)   Resp 16   Wt 199 lb (90.3 kg)   LMP 04/20/2021   SpO2 97%   BMI 35.25 kg/m  General Appearance: Well nourished, in no apparent distress. Eyes: PERRLA, EOMs, conjunctiva no swelling or erythema Sinuses: No Frontal/maxillary tenderness ENT/Mouth: Ext aud canals clear, TMs without erythema, bulging. No erythema, swelling, or exudate on post pharynx.  Tonsils not swollen or erythematous. Hearing normal.  Neck: Supple, thyroid normal.  Respiratory: Respiratory effort normal, BS equal bilaterally without rales, rhonchi, wheezing or stridor.   Cardio: RRR with no MRGs. Brisk peripheral pulses without edema.  Abdomen: Soft, + BS.  Non tender, no guarding, rebound, hernias, masses. Lymphatics: Non tender without lymphadenopathy.  Musculoskeletal: Full ROM, 5/5 strength, normal gait.  Skin: Warm, dry without rashes, lesions, ecchymosis.  Neuro: Cranial nerves intact. Normal muscle tone, no cerebellar symptoms. Sensation intact.  Psych: Awake and oriented X 3, normal affect, Insight and Judgment appropriate.    Diagnoses and all orders for this visit:  Acute bilateral knee pain Unknown etiology tx s/s with  alternate with acetaminophen and Ibuprofen.  You must stay active and avoid a sedentary lifestyle.   Cold intolerance of hand -     TSH + free T4 -     CBC with Differential  Loss of appetite -     TSH + free T4 -     POCT urine pregnancy   Kerin Perna, NP 1:45 PM

## 2021-05-31 NOTE — Telephone Encounter (Signed)
Pt called in c/o bilateral foot , ankle and leg swelling up to her knees.  The swelling went down after she soaked in Epsom salts last night.  She has taken the last 2 days off of work due to the swelling.  It leaves an indention when she presses in on the swelling.  She is also having restlessness in her legs.  Her right knee is hurting her also.   Denies accidents or injuries to legs or knee.  I made her an appt with [facility] with Juluis Mire, NP for today at 1:30.  See triage notes

## 2021-05-31 NOTE — Progress Notes (Signed)
BLE edema  Right knee pain

## 2021-05-31 NOTE — Patient Instructions (Signed)
Acute Knee Pain, Adult Acute knee pain is sudden and may be caused by damage, swelling, or irritation of the muscles and tissues that support the knee. Pain may result from: A fall. An injury to the knee from twisting motions. A hit to the knee. Infection. Acute knee pain may go away on its own with time and rest. If it does not, your health care provider may order tests to find the cause of the pain. These may include: Imaging tests, such as an X-ray, MRI, CT scan, or ultrasound. Joint aspiration. In this test, fluid is removed from the knee and evaluated. Arthroscopy. In this test, a lighted tube is inserted into the knee and an image is projected onto a TV screen. Biopsy. In this test, a sample of tissue is removed from the body and studied under a microscope. Follow these instructions at home: If you have a knee sleeve or brace:  Wear the knee sleeve or brace as told by your health care provider. Remove it only as told by your health care provider. Loosen it if your toes tingle, become numb, or turn cold and blue. Keep it clean. If the knee sleeve or brace is not waterproof: Do not let it get wet. Cover it with a watertight covering when you take a bath or shower.  Activity Rest your knee. Do not do things that cause pain or make pain worse. Avoid high-impact activities or exercises, such as running, jumping rope, or doing jumping jacks. Work with a physical therapist to make a safe exercise program, as recommended by your health care provider. Do exercises as told by your physical therapist. Managing pain, stiffness, and swelling  If directed, put ice on the affected knee. To do this: If you have a removable knee sleeve or brace, remove it as told by your health care provider. Put ice in a plastic bag. Place a towel between your skin and the bag. Leave the ice on for 20 minutes, 2-3 times a day. Remove the ice if your skin turns bright red. This is very important. If you cannot  feel pain, heat, or cold, you have a greater risk of damage to the area. If directed, use an elastic bandage to put pressure (compression) on your injured knee. This may control swelling, give support, and help with discomfort. Raise (elevate) your knee above the level of your heart while you are sitting or lying down. Sleep with a pillow under your knee.  General instructions Take over-the-counter and prescription medicines only as told by your health care provider. Do not use any products that contain nicotine or tobacco, such as cigarettes, e-cigarettes, and chewing tobacco. If you need help quitting, ask your health care provider. If you are overweight, work with your health care provider and a dietitian to set a weight-loss goal that is healthy and reasonable for you. Extra weight can put pressure on your knee. Pay attention to any changes in your symptoms. Keep all follow-up visits. This is important. Contact a health care provider if: Your knee pain continues, changes, or gets worse. You have a fever along with knee pain. Your knee feels warm to the touch or is red. Your knee buckles or locks up. Get help right away if: Your knee swells, and the swelling becomes worse. You cannot move your knee. You have severe pain in your knee that cannot be managed with pain medicine. Summary Acute knee pain can be caused by a fall, an injury, an infection, or damage, swelling,   or irritation of the tissues that support your knee. Your health care provider may perform tests to find out the cause of the pain. Pay attention to any changes in your symptoms. Relieve your pain with rest, medicines, light activity, and the use of ice. Get help right away if your knee swells, you cannot move your knee, or you have severe pain that cannot be managed with medicine. This information is not intended to replace advice given to you by your health care provider. Make sure you discuss any questions you have with  your healthcare provider. Document Revised: 01/07/2020 Document Reviewed: 01/07/2020 Elsevier Patient Education  2022 [employer] Inc.  

## 2021-06-01 ENCOUNTER — Encounter (HOSPITAL_COMMUNITY): Payer: No Payment, Other | Admitting: Psychiatry

## 2021-06-01 LAB — CBC WITH DIFFERENTIAL/PLATELET
Basophils Absolute: 0.1 10*3/uL (ref 0.0–0.2)
Basos: 0 %
EOS (ABSOLUTE): 0.1 10*3/uL (ref 0.0–0.4)
Eos: 1 %
Hematocrit: 39.5 % (ref 34.0–46.6)
Hemoglobin: 12.8 g/dL (ref 11.1–15.9)
Immature Grans (Abs): 0 10*3/uL (ref 0.0–0.1)
Immature Granulocytes: 0 %
Lymphocytes Absolute: 2.3 10*3/uL (ref 0.7–3.1)
Lymphs: 17 %
MCH: 26 pg — ABNORMAL LOW (ref 26.6–33.0)
MCHC: 32.4 g/dL (ref 31.5–35.7)
MCV: 80 fL (ref 79–97)
Monocytes Absolute: 1 10*3/uL — ABNORMAL HIGH (ref 0.1–0.9)
Monocytes: 7 %
Neutrophils Absolute: 9.9 10*3/uL — ABNORMAL HIGH (ref 1.4–7.0)
Neutrophils: 75 %
Platelets: 430 10*3/uL (ref 150–450)
RBC: 4.93 x10E6/uL (ref 3.77–5.28)
RDW: 14.1 % (ref 11.7–15.4)
WBC: 13.4 10*3/uL — ABNORMAL HIGH (ref 3.4–10.8)

## 2021-06-01 LAB — TSH+FREE T4
Free T4: 0.88 ng/dL (ref 0.82–1.77)
TSH: 1.31 u[IU]/mL (ref 0.450–4.500)

## 2021-06-09 ENCOUNTER — Encounter (HOSPITAL_COMMUNITY): Payer: No Payment, Other | Admitting: Psychiatry

## 2021-06-09 ENCOUNTER — Encounter
Payer: No Typology Code available for payment source | Attending: Physical Medicine & Rehabilitation | Admitting: Physical Medicine & Rehabilitation

## 2021-06-09 ENCOUNTER — Other Ambulatory Visit: Payer: Self-pay

## 2021-06-09 DIAGNOSIS — G894 Chronic pain syndrome: Secondary | ICD-10-CM | POA: Insufficient documentation

## 2021-06-09 NOTE — Progress Notes (Signed)
Pt could not be contacted for video visit, will reschedule

## 2021-06-13 ENCOUNTER — Ambulatory Visit (HOSPITAL_COMMUNITY): Payer: No Payment, Other | Admitting: Clinical

## 2021-06-15 ENCOUNTER — Encounter (HOSPITAL_COMMUNITY): Payer: Self-pay

## 2021-06-15 ENCOUNTER — Ambulatory Visit (HOSPITAL_COMMUNITY)
Admission: EM | Admit: 2021-06-15 | Discharge: 2021-06-15 | Disposition: A | Discharge status: Home / Self Care | Payer: No Typology Code available for payment source | Attending: Medical Oncology | Admitting: Medical Oncology

## 2021-06-15 ENCOUNTER — Other Ambulatory Visit: Payer: Self-pay

## 2021-06-15 DIAGNOSIS — M79672 Pain in left foot: Secondary | ICD-10-CM

## 2021-06-15 MED ORDER — ETODOLAC 400 MG PO TABS
400.0000 mg | ORAL_TABLET | Freq: Two times a day (BID) | ORAL | 0 refills | Status: AC
  Filled 2021-06-15: qty 28, 14d supply, fill #0
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
## 2021-06-15 NOTE — Discharge Instructions (Addendum)
Please take the anti-inflammatory pain medication with food and water.  Should she have any upset stomach please stop the medication.  If you have any vomiting that appears to be coffee-ground in nature or bloody please seek emergency attention at the emergency room.

## 2021-06-16 ENCOUNTER — Other Ambulatory Visit: Payer: Self-pay

## 2021-06-23 ENCOUNTER — Ambulatory Visit (INDEPENDENT_AMBULATORY_CARE_PROVIDER_SITE_OTHER): Payer: Self-pay

## 2021-06-23 ENCOUNTER — Ambulatory Visit (INDEPENDENT_AMBULATORY_CARE_PROVIDER_SITE_OTHER): Payer: No Payment, Other | Admitting: Psychiatry

## 2021-06-23 ENCOUNTER — Other Ambulatory Visit (HOSPITAL_BASED_OUTPATIENT_CLINIC_OR_DEPARTMENT_OTHER): Payer: Self-pay | Admitting: Orthopaedic Surgery

## 2021-06-23 ENCOUNTER — Encounter (HOSPITAL_COMMUNITY): Payer: Self-pay | Admitting: Psychiatry

## 2021-06-23 ENCOUNTER — Other Ambulatory Visit: Payer: Self-pay

## 2021-06-23 VITALS — BP 127/101 | HR 85 | Ht 63.0 in | Wt 204.0 lb

## 2021-06-23 DIAGNOSIS — F331 Major depressive disorder, recurrent, moderate: Secondary | ICD-10-CM | POA: Diagnosis not present

## 2021-06-23 DIAGNOSIS — M25572 Pain in left ankle and joints of left foot: Secondary | ICD-10-CM

## 2021-06-23 DIAGNOSIS — F411 Generalized anxiety disorder: Secondary | ICD-10-CM

## 2021-06-23 MED ORDER — DULOXETINE HCL 30 MG PO CPEP
30.0000 mg | ORAL_CAPSULE | Freq: Every day | ORAL | 2 refills | Status: DC
  Filled 2021-06-23: qty 30, 30d supply, fill #0
  Filled 2021-07-27: qty 30, 30d supply, fill #1

## 2021-06-23 MED ORDER — TRAZODONE HCL 50 MG PO TABS
50.0000 mg | ORAL_TABLET | Freq: Every day | ORAL | 2 refills | Status: DC
  Filled 2021-06-23: qty 30, 30d supply, fill #0
  Filled 2021-07-27: qty 30, 30d supply, fill #1

## 2021-06-23 NOTE — Telephone Encounter (Signed)
Pt. Reports she has been having "dizzy spells when I stand up for a month but it's getting worse." States she started Prozac 2 weeks ago. Eating and drinking well. Requests to be seen as soon as possible. Unable to reach [facility]. Please  advise pt.     Answer Assessment - Initial Assessment Questions 1. DESCRIPTION: "Describe your dizziness."     Dizzy 2. LIGHTHEADED: "Do you feel lightheaded?" (e.g., somewhat faint, woozy, weak upon standing)     Yes 3. VERTIGO: "Do you feel like either you or the room is spinning or tilting?" (i.e. vertigo)     No 4. SEVERITY: "How bad is it?"  "Do you feel like you are going to faint?" "Can you stand and walk?"   - MILD: Feels slightly dizzy, but walking normally.   - MODERATE: Feels unsteady when walking, but not falling; interferes with normal activities (e.g., school, work).   - SEVERE: Unable to walk without falling, or requires assistance to walk without falling; feels like passing out now.      Moderate 5. ONSET:  "When did the dizziness begin?"     1 month 6. AGGRAVATING FACTORS: "Does anything make it worse?" (e.g., standing, change in head position)     Standing 7. HEART RATE: "Can you tell me your heart rate?" "How many beats in 15 seconds?"  (Note: not all patients can do this)       No 8. CAUSE: "What do you think is causing the dizziness?"     Unsure 9. RECURRENT SYMPTOM: "Have you had dizziness before?" If Yes, ask: "When was the last time?" "What happened that time?"     No 10. OTHER SYMPTOMS: "Do you have any other symptoms?" (e.g., fever, chest pain, vomiting, diarrhea, bleeding)       No 11. PREGNANCY: "Is there any chance you are pregnant?" "When was your last menstrual period?"       No  Protocols used: Dizziness - Lightheadedness-A-AH

## 2021-06-23 NOTE — Patient Instructions (Signed)
Follow-up in 2 weeks

## 2021-06-23 NOTE — Progress Notes (Addendum)
BH MD/PA/NP OP Progress Note  06/23/2021 1:57 PM Wanda Crane  MRN:  [id number]  Chief Complaint:  Chief Complaint   Medication Management    Follow up. HPI: Wanda Crane is a 30 year old female with past medical history of MDD presented to [facility] outpatient clinic for follow up.  Patient is seen and examined today. Patient is anxious, depressed, and tearful.  Patient states she is still feeling very depressed and anxious.  She states she is not able to sleep at night and has nightmares about his stepdad who died recently.  She rates her mood 2/10 on a scale of 1-10 with 10 being the best mood.  She reports poor appetite and anxiety. Patient states Prozac has not been helping her with her depression and anxiety and she wants to try something else.  She is really worried that she is running out of options as she tried different medications in the past and it did not work.  Currently, she denies active or passive suicidal ideations. She denies any active HI but endorses vague passive HI towards people who give her attitude.    She states that sometimes she feels agitated and wants to beat them up. She states she knows that she is not going to actually hurt anyone. She thinks that it may be due to Prozac. She reports auditory hallucinations of hearing her name being called , people talking to each other and also heard somebody knocking on her door last night. She denies any command hallucinations. She denies any visual hallucinations.  She has not been going to grief counseling or therapy.  Encouraged her to continue therapy and grief counseling with counselor at [facility].  She is currently unemployed and not looking for work.  She thinks that she should not work because of her depression.  Discussed discontinuing Prozac and starting Cymbalta for depression and trazodone for sleep.  Patient agrees with the plan.  Discussed that if auditory hallucinations gets worse or is commanding in nature, she should report  immediately to ED/[facility]/or call 911.  Patient verbalizes understanding.  Visit Diagnosis:    ICD-10-CM   1. MDD (major depressive disorder), recurrent episode, moderate (HCC)  F33.1 DULoxetine (CYMBALTA) 30 MG capsule    traZODone (DESYREL) 50 MG tablet    2. Generalized anxiety disorder  F41.1       Past Psychiatric History: Depression  Patient tried Lexapro and Wellbutrin for couple months in the past. Started on Prozac 10 mg and Neuron 100 mg TID on 04/20/21.  Past Medical History:  Past Medical History:  Diagnosis Date   Chronic right-sided low back pain with right-sided sciatica 04/13/2017   Hemorrhagic cyst of ovary    Spondylolisthesis, grade 1 11/27/2016   History reviewed. No pertinent surgical history.  Family Psychiatric History: Aunt bipolar schizophrenic, Mom had bipolar  Family History:  Family History  Problem Relation Age of Onset   Asthma Mother    COPD Mother    Diabetes Maternal Grandmother    Heart disease Maternal Grandmother    Stroke Maternal Grandmother    Diabetes Maternal Grandfather    Heart disease Maternal Grandfather    Stroke Maternal Grandfather     Social History:  Social History   Socioeconomic History   Marital status: Single    Spouse name: Not on file   Number of children: Not on file   Years of education: Not on file   Highest education level: Not on file  Occupational History   Not on file  Tobacco Use   Smoking status: Never   Smokeless tobacco: Never  Vaping Use   Vaping Use: Never used  Substance and Sexual Activity   Alcohol use: No   Drug use: No   Sexual activity: Yes    Birth control/protection: None  Other Topics Concern   Not on file  Social History Narrative   Not on file   Social Determinants of Health   Financial Resource Strain: Not on file  Food Insecurity: Not on file  Transportation Needs: Not on file  Physical Activity: Not on file  Stress: Not on file  Social Connections: Not on file     Allergies: No Known Allergies  Metabolic Disorder Labs: Lab Results  Component Value Date   HGBA1C 5.5 11/09/2020   No results found for: PROLACTIN No results found for: CHOL, TRIG, HDL, CHOLHDL, VLDL, LDLCALC Lab Results  Component Value Date   TSH 1.310 05/31/2021   TSH 0.522 04/19/2015    Therapeutic Level Labs: No results found for: LITHIUM No results found for: VALPROATE No components found for:  CBMZ  Current Medications: Current Outpatient Medications  Medication Sig Dispense Refill   DULoxetine (CYMBALTA) 30 MG capsule Take 1 capsule (30 mg total) by mouth daily. 30 capsule 2   etodolac (LODINE) 400 MG tablet Take 1 tablet (400 mg total) by mouth 2 (two) times daily for 14 days. 28 tablet 0   traZODone (DESYREL) 50 MG tablet Take 1 tablet (50 mg total) by mouth at bedtime. 30 tablet 2   gabapentin (NEURONTIN) 100 MG capsule Take 1 capsule (100 mg total) by mouth 3 (three) times daily. (Patient not taking: Reported on 04/21/2021) 90 capsule 2   No current facility-administered medications for this visit.     Musculoskeletal: Strength & Muscle Tone: within normal limits Gait & Station: normal Patient leans: N/A  Psychiatric Specialty Exam: Review of Systems  Blood pressure (!) 127/101, pulse 85, height 5\' 3"  (1.6 m), weight 204 lb (92.5 kg).Body mass index is 36.14 kg/m.  General Appearance: Casual  Eye Contact:  Fair  Speech:  Normal Rate  Volume:  Normal  Mood:  Anxious and Depressed  Affect:  Depressed and Tearful  Thought Process:  Coherent  Orientation:  Full (Time, Place, and Person)  Thought Content: Hallucinations: Auditory hearing her name being called , people talking to each other and heard somebody knocking on her door last night.  Suicidal Thoughts:  No  Homicidal Thoughts: Denies active HI, endorses vague passive HI towards people who give her attitude.  Knows that she is not going to actually hurt anyone.  Memory:  Immediate;    Good Recent;   Good  Judgement:  Fair  Insight:  Good  Psychomotor Activity:  Normal  Concentration:  Concentration: Good and Attention Span: Good  Recall:  Brent of Knowledge: Good  Language: Good  Akathisia:  No  Handed:  Right  AIMS (if indicated): not done  Assets:  Communication Skills Desire for Improvement Housing Intimacy Resilience  ADL's:  Intact  Cognition: WNL  Sleep:  Poor   Screenings: GAD-7    Flowsheet Row Office Visit from 05/31/2021 in [place] [facility] from 04/04/2021 in [facility] Office Visit from 11/09/2020 in [place] Office Visit from 09/10/2020 in [place] from 07/06/2020 in [place]  Total GAD-7 Score 21 18 21 21  21  [place] Office Visit from 05/31/2021 in [place] Office Visit from 04/21/2021 in [place] [facility] from 04/04/2021 in [facility] Office Visit from 11/09/2020 in [place] Office Visit from 09/10/2020 in [place]  PHQ-2 Total Score 6 2 6 6 6   PHQ-9 Total Score 22 -- 24 24 24       Flowsheet Row ED from 06/15/2021 in [facility] from 04/04/2021 in [facility] ED from 12/24/2020 in [facility] [place] No Risk No Risk Error: Question 6 not populated        Assessment and Plan: Wanda Crane is a 30 year old female with past medical history of MDD  came to [facility] for follow up.  Labs reviewed- Last CBC on 10/25 shows wbc 13.4 and Hb 12.8. TSH 1.310 and T4 0.88   Major depressive disorder, recurrent, moderate. -Discontinue Prozac. -Start Cymbalta 30 mg daily. (r/b/se/a discussed and patient agrees with medication  trial.)([facility]) -Start trazodone 50 mg nightly for insomnia. ([facility])  Anxiety and chronic pain -Continue Gabapentin 100 mg 3 times daily.   Therapy and grief counseling -Recommended to continue therapy and grief counseling with therapist at [facility] outpatient clinic.  Patient agrees with the plan   Follow-up- in 2 weeks  Armando Reichert, MD PGY2 06/23/2021, 1:57 PM

## 2021-06-24 ENCOUNTER — Ambulatory Visit (HOSPITAL_BASED_OUTPATIENT_CLINIC_OR_DEPARTMENT_OTHER): Payer: No Typology Code available for payment source | Admitting: Orthopaedic Surgery

## 2021-06-24 ENCOUNTER — Other Ambulatory Visit: Payer: Self-pay

## 2021-06-27 NOTE — Telephone Encounter (Signed)
Patient is no longer on Prozac. This was changed to Cymbalta by provider [facility]. She was having dizziness prior to any medication changes. She is aware that office does not have any available appointments this week and she may be encouraged to go to UC. CMA informed patient that message would be routed to PCP for advising and she would receive a call back with that advise.

## 2021-06-28 ENCOUNTER — Other Ambulatory Visit: Payer: Self-pay

## 2021-06-28 ENCOUNTER — Ambulatory Visit (INDEPENDENT_AMBULATORY_CARE_PROVIDER_SITE_OTHER): Payer: Self-pay | Admitting: Orthopaedic Surgery

## 2021-06-28 ENCOUNTER — Ambulatory Visit (HOSPITAL_BASED_OUTPATIENT_CLINIC_OR_DEPARTMENT_OTHER)
Admission: RE | Admit: 2021-06-28 | Discharge: 2021-06-28 | Disposition: A | Discharge status: Home / Self Care | Payer: No Typology Code available for payment source | Source: Ambulatory Visit | Attending: Orthopaedic Surgery | Admitting: Orthopaedic Surgery

## 2021-06-28 DIAGNOSIS — M5441 Lumbago with sciatica, right side: Secondary | ICD-10-CM

## 2021-06-28 DIAGNOSIS — G8929 Other chronic pain: Secondary | ICD-10-CM

## 2021-06-28 DIAGNOSIS — M25572 Pain in left ankle and joints of left foot: Secondary | ICD-10-CM | POA: Insufficient documentation

## 2021-06-28 NOTE — Progress Notes (Signed)
Chief Complaint: left foot pain     History of Present Illness:   Wanda Crane is a 30 y.o. female with known history of L5 on S1 spondylolisthesis presents with pain rating down the lateral aspect of the left foot.  She has previously been seen for her back as she has a radiating pain down the right side.  This has been going on for several years.  She has had a history of injections in the past which do help her.  She has done physical therapy in the past which she states aggravates her pain.  She is currently taking gabapentin.  This dose was recently increased which does help with the radiating type pain.  She is not working at this time.    Surgical History:   None  PMH/PSH/Family History/Social History/Meds/Allergies:    Past Medical History:  Diagnosis Date   Chronic right-sided low back pain with right-sided sciatica 04/13/2017   Hemorrhagic cyst of ovary    Spondylolisthesis, grade 1 11/27/2016   No past surgical history on file. Social History   Socioeconomic History   Marital status: Single    Spouse name: Not on file   Number of children: Not on file   Years of education: Not on file   Highest education level: Not on file  Occupational History   Not on file  Tobacco Use   Smoking status: Never   Smokeless tobacco: Never  Vaping Use   Vaping Use: Never used  Substance and Sexual Activity   Alcohol use: No   Drug use: No   Sexual activity: Yes    Birth control/protection: None  Other Topics Concern   Not on file  Social History Narrative   Not on file   Social Determinants of Health   Financial Resource Strain: Not on file  Food Insecurity: Not on file  Transportation Needs: Not on file  Physical Activity: Not on file  Stress: Not on file  Social Connections: Not on file   Family History  Problem Relation Age of Onset   Asthma Mother    COPD Mother    Diabetes Maternal Grandmother    Heart disease Maternal  Grandmother    Stroke Maternal Grandmother    Diabetes Maternal Grandfather    Heart disease Maternal Grandfather    Stroke Maternal Grandfather    No Known Allergies Current Outpatient Medications  Medication Sig Dispense Refill   DULoxetine (CYMBALTA) 30 MG capsule Take 1 capsule (30 mg total) by mouth daily. 30 capsule 2   etodolac (LODINE) 400 MG tablet Take 1 tablet (400 mg total) by mouth 2 (two) times daily for 14 days. 28 tablet 0   gabapentin (NEURONTIN) 100 MG capsule Take 1 capsule (100 mg total) by mouth 3 (three) times daily. (Patient not taking: Reported on 04/21/2021) 90 capsule 2   traZODone (DESYREL) 50 MG tablet Take 1 tablet (50 mg total) by mouth at bedtime. 30 tablet 2   No current facility-administered medications for this visit.   No results found.  Review of Systems:   A ROS was performed including pertinent positives and negatives as documented in the HPI.  Physical Exam :   Constitutional: NAD and appears stated age Neurological: Alert and oriented Psych: Appropriate affect and cooperative There were no vitals taken for this visit.  Comprehensive Musculoskeletal Exam:    Radicular pain down the left lateral ankle into the fourth and fifth metatarsal.  No pain with palpation about the metatarsals.  No pain with palpation of the ankle.  She has full strength and range of motion of bilateral lower extremities.  2+ dorsalis pedis.  Imaging:   Xray (3 views left foot): Normal  MRI (lumbar spine): Significant L5 on S1 spondylolisthesis with bilateral S1 nerve root compression  I personally reviewed and interpreted the radiographs.   Assessment:   30 year old female with bilateral S1 compression from dynamic spondylolisthesis.  At this time I would like to refer her for an injection to my partner Dr. Ernestina Patches as she states that these have previously helped more than other treatments like physical therapy.  I did discuss that ultimately she would be a  surgical candidate as she is currently disabled from this and not able to work.  I believe that surgery may give her the option to have a more long-lasting relief.  Plan :    -Plan for referral to Dr. Ernestina Patches for epidural injection.  Also discussed that she should discuss possible surgery over with her husband and they we will consider a referral to Dr. Lorin Mercy.     I personally saw and evaluated the patient, and participated in the management and treatment plan.  Vanetta Mulders, MD Attending Physician, Orthopedic Surgery  This document was dictated using Dragon voice recognition software. A reasonable attempt at proof reading has been made to minimize errors.

## 2021-07-27 ENCOUNTER — Other Ambulatory Visit: Payer: Self-pay

## 2021-07-28 ENCOUNTER — Ambulatory Visit (INDEPENDENT_AMBULATORY_CARE_PROVIDER_SITE_OTHER): Payer: No Payment, Other | Admitting: Psychiatry

## 2021-07-28 ENCOUNTER — Other Ambulatory Visit: Payer: Self-pay

## 2021-07-28 ENCOUNTER — Encounter (HOSPITAL_COMMUNITY): Payer: Self-pay | Admitting: Psychiatry

## 2021-07-28 VITALS — BP 133/75 | HR 87 | Ht 63.0 in | Wt 202.0 lb

## 2021-07-28 DIAGNOSIS — F411 Generalized anxiety disorder: Secondary | ICD-10-CM | POA: Diagnosis not present

## 2021-07-28 DIAGNOSIS — F331 Major depressive disorder, recurrent, moderate: Secondary | ICD-10-CM

## 2021-07-28 MED ORDER — DULOXETINE HCL 60 MG PO CPEP
60.0000 mg | ORAL_CAPSULE | Freq: Every day | ORAL | 2 refills | Status: AC
  Filled 2021-07-28 – 2021-08-29 (×2): qty 30, 30d supply, fill #0

## 2021-07-28 MED ORDER — GABAPENTIN 100 MG PO CAPS
100.0000 mg | ORAL_CAPSULE | Freq: Three times a day (TID) | ORAL | 2 refills | Status: DC
  Filled 2021-07-28: qty 90, 30d supply, fill #0

## 2021-07-28 MED ORDER — TRAZODONE HCL 100 MG PO TABS
100.0000 mg | ORAL_TABLET | Freq: Every day | ORAL | 2 refills | Status: AC
  Filled 2021-07-28 – 2021-08-29 (×2): qty 30, 30d supply, fill #0

## 2021-07-28 NOTE — Progress Notes (Signed)
[place] MD/PA/NP OP Progress Note  07/28/2021 2:27 PM Wanda Crane  MRN:  [id number]  Chief Complaint:  Chief Complaint   Medication Management     Follow up. HPI: Wanda Crane is a 30 year old female with past medical history of MDD presented to [facility] outpatient clinic for follow up.   Pt is seen and examined today. Pt states her mood is still depressed and anxious but she feels that her medication has started kicking in. Pt rates her depression at 9/10 and anxiety at 10/10 on a scale of 1-10 where 10 being severe symptoms.  She states that she has been thinking about her family during this holiday season and her mom's birthday is also coming in February.  She states she has not gotten over her mom's death yet and still grieving.  Patient reports poor appetite and poor sleep.  She states trazodone does not help much with sleep.  She stated feels irritable and agitated sometimes.  She denies any nightmares but reports some flashbacks.  She states flashbacks are good memories of her family.  Currently, Pt denies any suicidal ideation, homicidal ideation and, visual and auditory hallucination. She denies paranoia. Pt denies has been tolerating her medications without any side effects.  She states she has not been getting therapy as she missed last appointment.  Recommend continuing therapy and grief counseling.  Discussed increasing Cymbalta to 60 mg daily and trazodone 100 mg daily.  Patient agrees with the plan.  Visit Diagnosis:    ICD-10-CM   1. Generalized anxiety disorder  F41.1     2. MDD (major depressive disorder), recurrent episode, moderate (HCC)  F33.1 DULoxetine (CYMBALTA) 60 MG capsule    traZODone (DESYREL) 100 MG tablet    3. Moderate episode of recurrent major depressive disorder (HCC)  F33.1 gabapentin (NEURONTIN) 100 MG capsule       Past Psychiatric History: Depression  Failed trials-Lexapro, Wellbutrin, Prozac Now on Cymbalta.   Past Medical History:  Past Medical  History:  Diagnosis Date   Chronic right-sided low back pain with right-sided sciatica 04/13/2017   Hemorrhagic cyst of ovary    Spondylolisthesis, grade 1 11/27/2016   No past surgical history on file.  Family Psychiatric History: Aunt bipolar schizophrenic, Mom had bipolar  Family History:  Family History  Problem Relation Age of Onset   Asthma Mother    COPD Mother    Diabetes Maternal Grandmother    Heart disease Maternal Grandmother    Stroke Maternal Grandmother    Diabetes Maternal Grandfather    Heart disease Maternal Grandfather    Stroke Maternal Grandfather     Social History:  Social History   Socioeconomic History   Marital status: Single    Spouse name: Not on file   Number of children: Not on file   Years of education: Not on file   Highest education level: Not on file  Occupational History   Not on file  Tobacco Use   Smoking status: Never   Smokeless tobacco: Never  Vaping Use   Vaping Use: Never used  Substance and Sexual Activity   Alcohol use: No   Drug use: No   Sexual activity: Yes    Birth control/protection: None  Other Topics Concern   Not on file  Social History Narrative   Not on file   Social Determinants of Health   Financial Resource Strain: Not on file  Food Insecurity: Not on file  Transportation Needs: Not on file  Physical Activity: Not on  file  Stress: Not on file  Social Connections: Not on file    Allergies: No Known Allergies  Metabolic Disorder Labs: Lab Results  Component Value Date   HGBA1C 5.5 11/09/2020   No results found for: PROLACTIN No results found for: CHOL, TRIG, HDL, CHOLHDL, VLDL, LDLCALC Lab Results  Component Value Date   TSH 1.310 05/31/2021   TSH 0.522 04/19/2015    Therapeutic Level Labs: No results found for: LITHIUM No results found for: VALPROATE No components found for:  CBMZ  Current Medications: Current Outpatient Medications  Medication Sig Dispense Refill   DULoxetine  (CYMBALTA) 60 MG capsule Take 1 capsule (60 mg total) by mouth daily. 30 capsule 2   gabapentin (NEURONTIN) 100 MG capsule Take 1 capsule (100 mg total) by mouth 3 (three) times daily. 90 capsule 2   traZODone (DESYREL) 100 MG tablet Take 1 tablet (100 mg total) by mouth at bedtime. 30 tablet 2   No current facility-administered medications for this visit.     Musculoskeletal: Strength & Muscle Tone: within normal limits Gait & Station: normal Patient leans: N/A  Psychiatric Specialty Exam: Review of Systems  Blood pressure 133/75, pulse 87, height 5\' 3"  (1.6 m), weight 202 lb (91.6 kg), SpO2 97 %.Body mass index is 35.78 kg/m.  General Appearance: Casual and Fairly Groomed  Eye Contact:  Fair  Speech:  Normal Rate  Volume:  Normal  Mood:  Anxious and Depressed  Affect:  Constricted but brighter than before  Thought Process:  Coherent  Orientation:  Full (Time, Place, and Person)  Thought Content: Hallucinations: None   Suicidal Thoughts:  No  Homicidal Thoughts: No  Memory:  Immediate;   Good Recent;   Good  Judgement:  Fair  Insight:  Good  Psychomotor Activity:  Normal  Concentration:  Concentration: Good and Attention Span: Good  Recall:  [place] of Knowledge: Good  Language: Good  Akathisia:  No  Handed:  Right  AIMS (if indicated): Not done  Assets:  Communication Skills Desire for Improvement Housing Intimacy Resilience  ADL's:  Intact  Cognition: WNL  Sleep:  Poor   Screenings: GAD-7    Flowsheet Row Office Visit from 05/31/2021 in [place] Counsel[facility] from 04/04/2021 in [facility] Office Visit from 11/09/2020 in [place] Office Visit from 09/10/2020 in [place] from 07/06/2020 in [place]  Total GAD-7 Score 21 18 21 21 21       PHQ2-9    [place] Office Visit from 05/31/2021 in [place] Office Visit from 04/21/2021 in [place] [facility] from 04/04/2021 in [facility] Office Visit from 11/09/2020 in [place] Office Visit from 09/10/2020 in [place]  PHQ-2 Total Score 6 2 6 6 6   PHQ-9 Total Score 22 -- 24 24 24       Flowsheet Row ED from 06/15/2021 in [facility] [facility] from 04/04/2021 in [facility] ED from 12/24/2020 in [facility] [place] No Risk No Risk Error: Question 6 not populated        Assessment and Plan: Wanda Crane is a 30 year old female with past medical history of MDD came to [facility] for follow up.  Labs reviewed- Last CBC on 10/25 shows wbc 13.4  and Hb 12.8. TSH 1.310 and T4 0.88   Major depressive disorder, recurrent, moderate. -Increase Cymbalta to 60 mg daily.  30 days Prescription sent to pharmacy with 2 refills.  -Increase trazodone to 100 mg nightly for insomnia.  Anxiety and chronic pain -Continue Gabapentin 100 mg 3 times daily.   Therapy and grief counseling -Recommended to continue therapy and grief counseling with therapist at [facility] outpatient clinic.  Patient agrees with the plan   Follow-up- in 4 weeks   Armando Reichert, MD PGY2 07/28/2021, 2:27 PM

## 2021-07-28 NOTE — Patient Instructions (Signed)
Follow up in 4 weeks.  Continue Therapy and Grief counseling.

## 2021-08-03 ENCOUNTER — Ambulatory Visit (INDEPENDENT_AMBULATORY_CARE_PROVIDER_SITE_OTHER): Payer: Self-pay | Admitting: *Deleted

## 2021-08-03 NOTE — Telephone Encounter (Signed)
°  Chief Complaint: restless legs, fatigue, and diarrhea since her Cymbalta and Trazodone doses were increased.   She is going to call her therapist that changed the doses. Symptoms: fatigue, diarrhea, restless legs Frequency: all the time.   Legs restless at night bad Pertinent Negatives: Patient denies being sick with other symptoms. Disposition: [] ED /[] Urgent Care (no appt availability in office) / [x] Appointment(In office/virtual)/ []  [place] [facility]/ [] Home Care/ [] Refused Recommended Disposition  Additional Notes: I made an appt with Juluis Mire, NP for 08/16/2021 at 10:30

## 2021-08-03 NOTE — Telephone Encounter (Signed)
Pt called in but the line disconnected or she hung up prior to connecting with me.   I returned the call Reason for Disposition  Taking a medicine that could cause weakness (e.g., blood pressure medications, diuretics)  Answer Assessment - Initial Assessment Questions 1. DESCRIPTION: "Describe how you are feeling."     *No Answer*Pt is having diarrhea, fatigued, has restless legs.   Diarrhea for a couple of days.   Fatigued for a couple of days.   2. SEVERITY: "How bad is it?"  "Can you stand and walk?"   - MILD - Feels weak or tired, but does not interfere with work, school or normal activities   - [place] to stand and walk; weakness interferes with work, school, or normal activities   - SEVERE - Unable to stand or walk     *No Answer* 3. ONSET:  "When did the weakness begin?"     Couple of days ago 4. CAUSE: "What do you think is causing the weakness?"     I think the fatigue is from my BP.   I've never had a problem with  my BP before.   My machine I don't know if it's accurate or not.   5. MEDICINES: "Have you recently started a new medicine or had a change in the amount of a medicine?"     She upped my Cymbalta dose.   Trazodone is increased too I take that to help me sleep.    6. OTHER SYMPTOMS: "Do you have any other symptoms?" (e.g., chest pain, fever, cough, SOB, vomiting, diarrhea, bleeding, other areas of pain)     See above 7. PREGNANCY: "Is there any chance you are pregnant?" "When was your last menstrual period?"     *No Answer*  Protocols used: Weakness (Generalized) and Fatigue-A-AH

## 2021-08-16 ENCOUNTER — Ambulatory Visit (INDEPENDENT_AMBULATORY_CARE_PROVIDER_SITE_OTHER): Payer: No Typology Code available for payment source | Admitting: Primary Care

## 2021-08-29 ENCOUNTER — Other Ambulatory Visit: Payer: Self-pay

## 2021-09-12 ENCOUNTER — Ambulatory Visit: Payer: Self-pay

## 2021-09-15 ENCOUNTER — Encounter: Payer: Self-pay | Admitting: Physical Medicine & Rehabilitation

## 2021-09-20 ENCOUNTER — Ambulatory Visit (INDEPENDENT_AMBULATORY_CARE_PROVIDER_SITE_OTHER): Payer: Self-pay

## 2021-09-20 ENCOUNTER — Other Ambulatory Visit: Payer: Self-pay

## 2021-09-20 NOTE — Telephone Encounter (Signed)
°  Chief Complaint: rectal bleeding Symptoms: rectal bleeding X1 Frequency: 1 time Pertinent Negatives: Patient denies large blood loss, weakness Disposition: [] ED /[] Urgent Care (no appt availability in office) / [] Appointment(In office/virtual)/ []  [place] [facility]/ [] Home Care/ [] Refused Recommended Disposition /[] [place] [facility]/ [x]  Follow-up with PCP Additional Notes: [facility] parking lot. Bleeding has stopped. She will be seen at [facility]. Pt is very scared and dose not want to wait to see PCP.  Reason for Disposition  MILD rectal bleeding (more than just a few drops or streaks)  Answer Assessment - Initial Assessment Questions 1. APPEARANCE of BLOOD: "What color is it?" "Is it passed separately, on the surface of the stool, or mixed in with the stool?"      red 2. AMOUNT: "How much blood was passed?"      Like a period 3. FREQUENCY: "How many times has blood been passed with the stools?"      1x 4. ONSET: "When was the blood first seen in the stools?" (Days or weeks)      1 hour ago 5. DIARRHEA: "Is there also some diarrhea?" If Yes, ask: "How many diarrhea stools in the past 24 hours?"      Yes - 1 time 6. CONSTIPATION: "Do you have constipation?" If Yes, ask: "How bad is it?"     no 7. RECURRENT SYMPTOMS: "Have you had blood in your stools before?" If Yes, ask: "When was the last time?" and "What happened that time?"      no 8. BLOOD THINNERS: "Do you take any blood thinners?" (e.g., Coumadin/warfarin, Pradaxa/dabigatran, aspirin)     no 9. OTHER SYMPTOMS: "Do you have any other symptoms?"  (e.g., abdomen pain, vomiting, dizziness, fever)     Left side cramp 10. PREGNANCY: "Is there any chance you are pregnant?" "When was your last menstrual period?"       no  Protocols used: Rectal Bleeding-A-AH

## 2021-09-21 ENCOUNTER — Ambulatory Visit: Payer: Self-pay | Attending: Primary Care

## 2021-09-21 NOTE — Telephone Encounter (Signed)
Unable to reach.  Left voicemail for patient to return call.

## 2021-09-22 ENCOUNTER — Ambulatory Visit (INDEPENDENT_AMBULATORY_CARE_PROVIDER_SITE_OTHER): Payer: No Payment, Other | Admitting: Psychiatry

## 2021-09-22 ENCOUNTER — Other Ambulatory Visit: Payer: Self-pay

## 2021-09-22 DIAGNOSIS — F331 Major depressive disorder, recurrent, moderate: Secondary | ICD-10-CM | POA: Diagnosis not present

## 2021-09-22 DIAGNOSIS — F411 Generalized anxiety disorder: Secondary | ICD-10-CM

## 2021-09-22 MED ORDER — GABAPENTIN 100 MG PO CAPS
200.0000 mg | ORAL_CAPSULE | Freq: Every day | ORAL | 2 refills | Status: DC
  Filled 2021-09-22 (×2): qty 60, 30d supply, fill #0

## 2021-09-22 MED ORDER — GABAPENTIN 100 MG PO CAPS
100.0000 mg | ORAL_CAPSULE | Freq: Two times a day (BID) | ORAL | 2 refills | Status: DC
  Filled 2021-09-22 (×2): qty 60, 30d supply, fill #0

## 2021-09-22 NOTE — Patient Instructions (Addendum)
Follow-up in 6 weeks

## 2021-09-22 NOTE — Progress Notes (Signed)
BH MD/PA/NP OP Progress Note  09/22/2021 1:52 PM Wanda Crane  MRN:  [id number]  Chief Complaint:   Medication Follow up.  HPI: Wanda Crane is a 31 year old female with past medical history of MDD presented to [facility] outpatient clinic for follow up.   Pt is seen today.  Patient has a brighter affect and smile sometimes.  Pt states her mood is improving.  She thinks the medications are helping her. Sometimes, she feels tired during the daytime.  Discussed that it may be due to trazodone.  She states trazodone helps her with sleep sometimes but sometimes she still have difficulty in sleeping.  She also reports weird dreams but denies any nightmares.  She states her flashbacks have also decreased a lot.  Patient reports stable appetite.  She denies feeling irritable or angry.  She shares that she got a positive news recently that her sister is about 3 months pregnant. Currently, Pt denies any suicidal ideation, homicidal ideation and, visual and auditory hallucination. Pt has been tolerating her medications without any side effects. Her back pain is also controlled. She states she has not been getting any therapy but her boyfriend talks to her which helps her. She has a good relationship with her boyfriend.  Recommend continuing therapy and grief counseling.  Discussed different coping skills such as going for a walk, listening to music, playing board games with boyfriend, and going for a drive.  Patient states she still does not have motivation to look for a job.  Encouraged patient to start looking for a job. Discussed increasing nightly gabapentin to 200 mg to help with sleep and anxiety.  Pt can stop taking Trazodone if Gabapentin helps with sleep. Patient agrees with the plan.  Visit Diagnosis:    ICD-10-CM   1. Generalized anxiety disorder  F41.1 gabapentin (NEURONTIN) 100 MG capsule    gabapentin (NEURONTIN) 100 MG capsule    2. MDD (major depressive disorder), recurrent episode, moderate (HCC)   F33.1     3. Moderate episode of recurrent major depressive disorder (HCC)  F33.1        Past Psychiatric History: Depression  Failed trials-Lexapro, Wellbutrin, Prozac Now on Cymbalta.   Past Medical History:  Past Medical History:  Diagnosis Date   Chronic right-sided low back pain with right-sided sciatica 04/13/2017   Hemorrhagic cyst of ovary    Spondylolisthesis, grade 1 11/27/2016   No past surgical history on file.  Family Psychiatric History: Aunt bipolar schizophrenic, Mom had bipolar  Family History:  Family History  Problem Relation Age of Onset   Asthma Mother    COPD Mother    Diabetes Maternal Grandmother    Heart disease Maternal Grandmother    Stroke Maternal Grandmother    Diabetes Maternal Grandfather    Heart disease Maternal Grandfather    Stroke Maternal Grandfather     Social History:  Social History   Socioeconomic History   Marital status: Single    Spouse name: Not on file   Number of children: Not on file   Years of education: Not on file   Highest education level: Not on file  Occupational History   Not on file  Tobacco Use   Smoking status: Never   Smokeless tobacco: Never  Vaping Use   Vaping Use: Never used  Substance and Sexual Activity   Alcohol use: No   Drug use: No   Sexual activity: Yes    Birth control/protection: None  Other Topics Concern   Not on  file  Social History Narrative   Not on file   Social Determinants of Health   Financial Resource Strain: Not on file  Food Insecurity: Not on file  Transportation Needs: Not on file  Physical Activity: Not on file  Stress: Not on file  Social Connections: Not on file    Allergies: No Known Allergies  Metabolic Disorder Labs: Lab Results  Component Value Date   HGBA1C 5.5 11/09/2020   No results found for: PROLACTIN No results found for: CHOL, TRIG, HDL, CHOLHDL, VLDL, LDLCALC Lab Results  Component Value Date   TSH 1.310 05/31/2021   TSH 0.522 04/19/2015     Therapeutic Level Labs: No results found for: LITHIUM No results found for: VALPROATE No components found for:  CBMZ  Current Medications: Current Outpatient Medications  Medication Sig Dispense Refill   gabapentin (NEURONTIN) 100 MG capsule Take 2 capsules (200 mg total) by mouth at bedtime. 60 capsule 2   DULoxetine (CYMBALTA) 60 MG capsule Take 1 capsule (60 mg total) by mouth daily. 30 capsule 2   gabapentin (NEURONTIN) 100 MG capsule Take 1 capsule (100 mg total) by mouth 2 (two) times daily. 90 capsule 2   traZODone (DESYREL) 100 MG tablet Take 1 tablet (100 mg total) by mouth at bedtime. 30 tablet 2   No current facility-administered medications for this visit.     Musculoskeletal: Strength & Muscle Tone: within normal limits Gait & Station: normal Patient leans: N/A  Psychiatric Specialty Exam: Review of Systems  There were no vitals taken for this visit.There is no height or weight on file to calculate BMI.  General Appearance: Casual and Fairly Groomed  Eye Contact:  Fair  Speech:  Normal Rate  Volume:  Normal  Mood:  Euthymic  Affect:  Full Range brighter affect  Thought Process:  Coherent  Orientation:  Full (Time, Place, and Person)  Thought Content: Hallucinations: None   Suicidal Thoughts:  No  Homicidal Thoughts: No  Memory:  Immediate;   Good Recent;   Good  Judgement:  Fair  Insight:  Good  Psychomotor Activity:  Normal  Concentration:  Concentration: Good and Attention Span: Good  Recall:  Good  Fund of Knowledge: Good  Language: Good  Akathisia:  No  Handed:  Right  AIMS (if indicated): Not done  Assets:  Communication Skills Desire for Improvement Housing Intimacy Resilience Social Support  ADL's:  Intact  Cognition: WNL  Sleep:  Fair   Screenings: GAD-7    Flowsheet Row Office Visit from 05/31/2021 in [place] [facility] from 04/04/2021 in [facility] Office Visit from  11/09/2020 in [place] Office Visit from 09/10/2020 in [place] from 07/06/2020 in [place]  Total GAD-7 Score 21 18 21 21 21       PHQ2-9    [place] Office Visit from 05/31/2021 in [place] Office Visit from 04/21/2021 in [facility] from 04/04/2021 in [facility] Office Visit from 11/09/2020 in [place] Office Visit from 09/10/2020 in [place]  PHQ-2 Total Score 6 2 6 6 6   PHQ-9 Total Score 22 -- 24 24 24       Flowsheet Row ED from 06/15/2021 in [facility] from 04/04/2021 in [facility] ED from 12/24/2020 in [facility]  Urgent Care at [place] No Risk No Risk Error: Question 6 not populated        Assessment and Plan: Wanda Crane is a 31 year old female with past medical history of MDD came to [facility] for follow up. Pt's depression and anxiety continue to improve.  Pt reports trazodone sometimes does not help with sleep and causes tiredness during daytime.  Discussed increasing night dose of gabapentin to 200 mg.  Patient can stop taking trazodone if gabapentin help with sleep  Labs reviewed- Last CBC on 10/25 shows wbc 13.4 and Hb 12.8. TSH 1.310 and T4 0.88   Major depressive disorder, recurrent, moderate. -Continue Cymbalta 60 mg daily.   -Continue trazodone 100 mg nightly for insomnia. Stop trazodone if gabapentin help with sleep.  Anxiety and chronic pain -Increase night dose of Gabapentin to 200 mg QHS to help with sleep and anxiety. Stop trazodone if gabapentin help with sleep. - Continue gabapentin 100 mg BID.   Therapy and grief counseling -Recommended to continue therapy and grief counseling with therapist at [facility] outpatient clinic.  Patient agrees with the plan   Follow-up- in 6 weeks   Armando Reichert, MD PGY2 09/22/2021, 1:52 PM

## 2021-09-26 ENCOUNTER — Other Ambulatory Visit: Payer: Self-pay

## 2021-10-06 ENCOUNTER — Encounter: Payer: Self-pay | Admitting: Physical Medicine & Rehabilitation

## 2021-10-06 ENCOUNTER — Encounter
Payer: No Typology Code available for payment source | Attending: Physical Medicine & Rehabilitation | Admitting: Physical Medicine & Rehabilitation

## 2021-10-06 ENCOUNTER — Other Ambulatory Visit: Payer: Self-pay

## 2021-10-06 VITALS — BP 96/64 | HR 84 | Temp 98.8°F | Ht 63.0 in | Wt 206.0 lb

## 2021-10-06 DIAGNOSIS — M47816 Spondylosis without myelopathy or radiculopathy, lumbar region: Secondary | ICD-10-CM | POA: Insufficient documentation

## 2021-10-06 NOTE — Patient Instructions (Signed)
Back Exercises These exercises help to make your trunk and back strong. They also help to keep the lower back flexible. Doing these exercises can help to prevent or lessen pain in your lower back. If you have back pain, try to do these exercises 2-3 times each day or as told by your doctor. As you get better, do the exercises once each day. Repeat the exercises more often as told by your doctor. To stop back pain from coming back, do the exercises once each day, or as told by your doctor. Do exercises exactly as told by your doctor. Stop right away if you feel sudden pain or your pain gets worse. Exercises Single knee to chest Do these steps 3-5 times in a row for each leg: Lie on your back on a firm bed or the floor with your legs stretched out. Bring one knee to your chest. Grab your knee or thigh with both hands and hold it in place. Pull on your knee until you feel a gentle stretch in your lower back or butt. Keep doing the stretch for 10-30 seconds. Slowly let go of your leg and straighten it. Pelvic tilt Do these steps 5-10 times in a row: Lie on your back on a firm bed or the floor with your legs stretched out. Bend your knees so they point up to the ceiling. Your feet should be flat on the floor. Tighten your lower belly (abdomen) muscles to press your lower back against the floor. This will make your tailbone point up to the ceiling instead of pointing down to your feet or the floor. Stay in this position for 5-10 seconds while you gently tighten your muscles and breathe evenly. Cat-cow Do these steps until your lower back bends more easily: Get on your hands and knees on a firm bed or the floor. Keep your hands under your shoulders, and keep your knees under your hips. You may put padding under your knees. Let your head hang down toward your chest. Tighten (contract) the muscles in your belly. Point your tailbone toward the floor so your lower back becomes rounded like the back of a  cat. Stay in this position for 5 seconds. Slowly lift your head. Let the muscles of your belly relax. Point your tailbone up toward the ceiling so your back forms a sagging arch like the back of a cow. Stay in this position for 5 seconds.  Press-ups Do these steps 5-10 times in a row: Lie on your belly (face-down) on a firm bed or the floor. Place your hands near your head, about shoulder-width apart. While you keep your back relaxed and keep your hips on the floor, slowly straighten your arms to raise the top half of your body and lift your shoulders. Do not use your back muscles. You may change where you place your hands to make yourself more comfortable. Stay in this position for 5 seconds. Keep your back relaxed. Slowly return to lying flat on the floor.  Bridges Do these steps 10 times in a row: Lie on your back on a firm bed or the floor. Bend your knees so they point up to the ceiling. Your feet should be flat on the floor. Your arms should be flat at your sides, next to your body. Tighten your butt muscles and lift your butt off the floor until your waist is almost as high as your knees. If you do not feel the muscles working in your butt and the back of   your thighs, slide your feet 1-2 inches (2.5-5 cm) farther away from your butt. ?Stay in this position for 3-5 seconds. ?Slowly lower your butt to the floor, and let your butt muscles relax. ?If this exercise is too easy, try doing it with your arms crossed over your chest. ?Belly crunches ?Do these steps 5-10 times in a row: ?Lie on your back on a firm bed or the floor with your legs stretched out. ?Bend your knees so they point up to the ceiling. Your feet should be flat on the floor. ?Cross your arms over your chest. ?Tip your chin a little bit toward your chest, but do not bend your neck. ?Tighten your belly muscles and slowly raise your chest just enough to lift your shoulder blades a tiny bit off the floor. Avoid raising your body  higher than that because it can put too much stress on your lower back. ?Slowly lower your chest and your head to the floor. ?Back lifts ?Do these steps 5-10 times in a row: ?Lie on your belly (face-down) with your arms at your sides, and rest your forehead on the floor. ?Tighten the muscles in your legs and your butt. ?Slowly lift your chest off the floor while you keep your hips on the floor. Keep the back of your head in line with the curve in your back. Look at the floor while you do this. ?Stay in this position for 3-5 seconds. ?Slowly lower your chest and your face to the floor. ?Contact a doctor if: ?Your back pain gets a lot worse when you do an exercise. ?Your back pain does not get better within 2 hours after you exercise. ?If you have any of these problems, stop doing the exercises. Do not do them again unless your doctor says it is okay. ?Get help right away if: ?You have sudden, very bad back pain. If this happens, stop doing the exercises. Do not do them again unless your doctor says it is okay. ?This information is not intended to replace advice given to you by your health care provider. Make sure you discuss any questions you have with your health care provider. ?Document Revised: 10/06/2020 Document Reviewed: 10/06/2020 ?Elsevier Patient Education ? [place]. ? ?

## 2021-10-06 NOTE — Progress Notes (Signed)
? ?Subjective:  ? ? Patient ID: Wanda Crane, female    DOB: January 12, 1991, 31 y.o.   MRN: [id number] ? ?HPI ?31 year old female with history of spina bifida occulta.  She also has a grade 1 L5-S1 spondylolisthesis. ? ?Patient is here to review MRI from September 2022.  Interval history, has followed up with orthopedics, recommended referral to Dr. Ernestina Patches for spinal injections.  The patient decided to follow-up here instead due to having undergone spinal injections at this office in the past. ?The patient has had no significant pain down the legs.  No new weakness no difficulty with walking she is independent with all ADLs and mobility.  No numbness in the lower extremities ?Pain Inventory ?Average Pain 9 ?Pain Right Now 9 ?My pain is constant, sharp, burning, stabbing, and aching ? ?In the last 24 hours, has pain interfered with the following? ?General activity 3 ?Relation with others 4 ?Enjoyment of life 4 ?What TIME of day is your pain at its worst? morning , daytime, evening, and night ?Sleep (in general) Fair ? ?Pain is worse with: walking, bending, sitting, inactivity, standing, and some activites ?Pain improves with: rest, medication, and injections ?Relief from Meds:  fair ? ?Family History  ?Problem Relation Age of Onset  ? Asthma Mother   ? COPD Mother   ? Diabetes Maternal Grandmother   ? Heart disease Maternal Grandmother   ? Stroke Maternal Grandmother   ? Diabetes Maternal Grandfather   ? Heart disease Maternal Grandfather   ? Stroke Maternal Grandfather   ? ?Social History  ? ?Socioeconomic History  ? Marital status: Single  ?  Spouse name: Not on file  ? Number of children: Not on file  ? Years of education: Not on file  ? Highest education level: Not on file  ?Occupational History  ? Not on file  ?Tobacco Use  ? Smoking status: Never  ? Smokeless tobacco: Never  ?Vaping Use  ? Vaping Use: Never used  ?Substance and Sexual Activity  ? Alcohol use: No  ? Drug use: No  ? Sexual activity: Yes  ?  Birth  control/protection: None  ?Other Topics Concern  ? Not on file  ?Social History Narrative  ? Not on file  ? ?Social Determinants of Health  ? ?Financial Resource Strain: Not on file  ?Food Insecurity: Not on file  ?Transportation Needs: Not on file  ?Physical Activity: Not on file  ?Stress: Not on file  ?Social Connections: Not on file  ? ?History reviewed. No pertinent surgical history. ?History reviewed. No pertinent surgical history. ?Past Medical History:  ?Diagnosis Date  ? Chronic right-sided low back pain with right-sided sciatica 04/13/2017  ? Hemorrhagic cyst of ovary   ? Spondylolisthesis, grade 1 11/27/2016  ? ?BP 96/64   Pulse 84   Temp 98.8 ?F (37.1 ?C)   Ht 5\' 3"  (1.6 m)   Wt 206 lb (93.4 kg)   SpO2 97%   BMI 36.49 kg/m?  ? ?Opioid Risk Score:   ?Fall Risk Score:  `1 ? ?Depression screen PHQ 2/9 ? ?Depression screen [facility] 2/9 05/31/2021 04/21/2021 11/09/2020 09/10/2020 07/09/2020 06/22/2020 06/17/2019  ?Decreased Interest 3 1 3 3 1 2  0  ?Down, Depressed, Hopeless 3 1 3 3 2 2 1   ?PHQ - 2 Score 6 2 6 6 3 4 1   ?Altered sleeping 3 - 3 3 3 3  -  ?Tired, decreased energy 3 - 3 3 3 3  -  ?Change in appetite 3 - 3 3 3 3  -  ?  Feeling bad or failure about yourself  - - 3 3 2 3  -  ?Trouble concentrating 3 - 3 3 2 2  -  ?Moving slowly or fidgety/restless 3 - 3 3 2 1  -  ?Suicidal thoughts 1 - 0 0 0 1 -  ?PHQ-9 Score 22 - 24 24 18 20  -  ?Difficult doing work/chores - - Very difficult - - - -  ?Some encounter information is confidential and restricted. Go to Review Flowsheets activity to see all data.  ?Some recent data might be hidden  ?  ?Review of Systems  ?Musculoskeletal:  Positive for back pain.  ?All other systems reviewed and are negative. ? ?   ?Objective:  ? Physical Exam ?Vitals and nursing note reviewed.  ?Constitutional:   ?   Appearance: She is obese.  ?HENT:  ?   Head: Normocephalic and atraumatic.  ?Eyes:  ?   General: No visual field deficit. ?   Extraocular Movements: Extraocular movements intact.  ?    Conjunctiva/sclera: Conjunctivae normal.  ?   Pupils: Pupils are equal, round, and reactive to light.  ?Musculoskeletal:  ?   Comments: Lumbar range of motion 75% flexion extension lateral bending and rotation. ?Mild paraspinal tenderness L5-S1 area ?Negative straight leg raise test  ?Skin: ?   General: Skin is warm and dry.  ?Neurological:  ?   Mental Status: She is alert and oriented to person, place, and time.  ?   Cranial Nerves: No dysarthria.  ?   Sensory: No sensory deficit.  ?   Motor: No weakness or abnormal muscle tone.  ?   Coordination: Coordination normal.  ?   Gait: Gait is intact.  ?   Comments: 5/5 strength bilateral hip flexor knee extensor ankle dorsiflexion ?Negative straight leg raising test  ?Psychiatric:     ?   Mood and Affect: Mood normal.     ?   Behavior: Behavior normal.  ? ? ? ? ? ?   ?Assessment & Plan:  ? ?#1.  History of spondylolisthesis pars defect also facet arthropathy at L5-S1, set up for bilateral L4-5 medial branch blocks under fluoroscopic guidance.  Failing this would consider sacroiliac injections ?Nonnarcotic due to abnormal UDS results showing more fentanyl on 04/21/2021 ? 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 Low back pain for 5 years. No surgery or injections. ?  ?EXAM: ?MRI LUMBAR SPINE WITHOUT CONTRAST ?  ?TECHNIQUE: ?Multiplanar, multisequence MR imaging of the lumbar spine was ?performed. No intravenous contrast was administered. ?  ?COMPARISON:  MR lumbar dated 06/27/2018. Radiography of the lumbar ?dated 11/13/2016. Radiography of the lumbar dated 08/14/2014. ?  ?FINDINGS: ?Segmentation:  Standard. ?  ?Alignment: Grade 1 anterolisthesis of L5 on S1 secondary to ?bilateral L5 pars interarticularis defects. ?  ?Vertebrae:  No fracture, evidence of discitis, or bone lesion. ?  ?Conus medullaris and cauda equina: Conus extends to the L2 level. ?Conus and cauda equina appear normal. ?  ?Paraspinal and other soft tissues: No  acute paraspinal abnormality. ?Small left renal cyst again noted. ?  ?Disc levels: ?  ?Disc spaces: Disc desiccation at L3-4 and L4-5. ?  ?T12-L1: No significant disc bulge. No neural foraminal stenosis. No ?central canal stenosis. ?  ?L1-L2: No significant disc bulge. No neural foraminal stenosis. No ?central canal stenosis. ?  ?L2-L3: No significant disc bulge. No neural foraminal stenosis. No ?central canal stenosis. ?  ?L3-L4: Minimal broad-based disc bulge. No foraminal or central canal ?stenosis. ?  ?L4-L5:  Minimal broad-based disc bulge. Tiny left paracentral disc ?protrusion. No foraminal or central canal stenosis. ?  ?L5-S1: Minimal broad-based disc bulge. Moderate bilateral facet ?arthropathy. Mild-moderate bilateral foraminal stenosis. ?  ?IMPRESSION: ?1. Lumbar spine spondylosis as described above similar in appearance ?to the prior examination of 06/27/2018 ?2. 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 ?3. No acute osseous injury of the lumbar spine. ?  ?  ?Electronically Signed ?  By: Kathreen Devoid M.D. ?  On: 04/29/2021 08:02 ?  ?

## 2021-11-03 ENCOUNTER — Encounter (HOSPITAL_COMMUNITY): Payer: No Payment, Other | Admitting: Psychiatry

## 2021-11-23 ENCOUNTER — Telehealth: Payer: Self-pay | Admitting: *Deleted

## 2021-11-23 MED ORDER — DIAZEPAM 10 MG PO TABS: ORAL_TABLET | ORAL | 0 refills | Status: AC

## 2021-11-23 NOTE — Telephone Encounter (Signed)
Requesting pre med for procedure. Pre med called to pharmacy per protocol. Must have a driver. ?

## 2021-11-24 ENCOUNTER — Encounter: Payer: Self-pay | Admitting: Physical Medicine & Rehabilitation

## 2021-11-24 ENCOUNTER — Encounter: Payer: Self-pay | Attending: Physical Medicine & Rehabilitation | Admitting: Physical Medicine & Rehabilitation

## 2021-11-24 ENCOUNTER — Other Ambulatory Visit: Payer: Self-pay

## 2021-11-24 VITALS — BP 120/87 | HR 78 | Temp 98.2°F | Ht 63.0 in | Wt 200.0 lb

## 2021-11-24 DIAGNOSIS — M47816 Spondylosis without myelopathy or radiculopathy, lumbar region: Secondary | ICD-10-CM | POA: Insufficient documentation

## 2021-11-24 MED ORDER — DIAZEPAM 10 MG PO TABS
10.0000 mg | ORAL_TABLET | ORAL | 0 refills | Status: DC
  Filled 2021-11-24: qty 1, 1d supply, fill #0

## 2021-11-24 NOTE — Progress Notes (Signed)
?  PROCEDURE RECORD ?[facility] ? ? ?Name: Wanda Crane ?DOB:03-Jul-1991 ?MRN: [id number] ? ?Date:11/24/2021  Physician: Alysia Penna, MD   ? ?Nurse/CMA: Brittney Jones ? ?Allergies: No Known Allergies ? ?Consent Signed: Yes.    Is patient diabetic? No.  CBG today? N/A ? ?Pregnant: No. LMP: No LMP recorded. (Menstrual status: Irregular Periods). (age 32-55) ? ?Anticoagulants: no ?Anti-inflammatory: no ?Antibiotics: no ? ?Procedure:  Bilateral  L4 L5 Medial Branch Block   Position: Prone ?Start Time: 11:49 am  End Time: 12:00 pm  Fluoro Time: 1:04 ? ?RN/CMA Earmon Phoenix, RMA    ?Time 11:00 AM 12:05 pm    ?BP 112/77 120/87    ?Pulse 90 78    ?Respirations 16 16    ?O2 Sat 98 99    ?S/S 6 6    ?Pain Level 9/10 5/10    ? ?D/C home with [name] (Brother In-law), patient A & O X 3, D/C instructions reviewed, and sits independently. ? ? ? ? ? ? ? ?

## 2021-11-24 NOTE — Patient Instructions (Signed)

## 2021-11-24 NOTE — Progress Notes (Signed)
Bilateral Lumbar  L4  medial branch blocks and L 5 dorsal ramus injection under fluoroscopic guidance ? ?Indication: Lumbar pain which is not relieved by medication management or other conservative care and interfering with self-care and mobility. ? ?Informed consent was obtained after describing risks and benefits of the procedure with the patient, this includes bleeding, infection, paralysis and medication side effects.  The patient wishes to proceed and has given written consent.  The patient was placed in prone position.  The lumbar area was marked and prepped with Betadine.  One mL of 1% lidocaine was injected into each of 6 areas into the skin and subcutaneous tissue.  Then a 22-gauge 3.5 in spinal needle was inserted targeting the junction of the left S1 superior articular process and sacral ala junction. Needle was advanced under fluoroscopic guidance.  Bone contact was made.  Isovue 200 was injected x 0.5 mL demonstrating no intravascular uptake.  Then a solution  of 2% MPF lidocaine was injected x 0.5 mL.  Then the left L5 superior articular process in transverse process junction was targeted.  Bone contact was made.  Isovue 200 was injected x 0.5 mL demonstrating no intravascular uptake. Then a solution containing  2% MPF lidocaine was injected x 0.5 mL.  This same procedure was performed on the right side using the same needle, technique and injectate.  Patient tolerated procedure well.  Post procedure instructions were given. ? ?Preinj pain 9/10 ?Post inj pain 5/10 ? ?If pain makes further improvements over the next couple hours would repeat MBB L L4-5 in 1 mo ?If pain scores fail to improve by at least 50% would recommend B Sacroiliac injections next month  ?

## 2022-01-12 ENCOUNTER — Encounter: Payer: No Typology Code available for payment source | Admitting: Physical Medicine & Rehabilitation

## 2022-01-12 NOTE — Progress Notes (Deleted)
  PROCEDURE RECORD [facility]   Name: Dina Warbington DOB:03-21-1991 MRN: [id number]  Date:01/12/2022  Physician: Alysia Penna, MD    Nurse/CMA: Jorja Loa MA  Allergies: No Known Allergies  Consent Signed: {yes [id number]}  Is patient diabetic? {yes no:314532}  CBG today? ***  Pregnant: {yes no:314532} LMP: No LMP recorded. (Menstrual status: Irregular Periods). (age 32-55)  Anticoagulants: {Yes/No:19989} Anti-inflammatory: {Yes/No:19989} Antibiotics: {Yes/No:19989}  Procedure:  Bilateral  L4 L5 MBB  Position: Prone Start Time: ***  End Time: ***  Fluoro Time: ***  RN/CMA Niki Cosman MA Keerat Denicola MA    Time      BP      Pulse      Respirations      O2 Sat      S/S      Pain Level       D/C home with ***, patient A & O X 3, D/C instructions reviewed, and sits independently.

## 2022-02-10 ENCOUNTER — Encounter (HOSPITAL_COMMUNITY): Payer: No Payment, Other | Admitting: Psychiatry

## 2022-02-15 ENCOUNTER — Ambulatory Visit (INDEPENDENT_AMBULATORY_CARE_PROVIDER_SITE_OTHER): Payer: Self-pay | Admitting: Primary Care

## 2022-02-15 ENCOUNTER — Encounter (INDEPENDENT_AMBULATORY_CARE_PROVIDER_SITE_OTHER): Payer: Self-pay | Admitting: Primary Care

## 2022-02-15 ENCOUNTER — Other Ambulatory Visit (HOSPITAL_COMMUNITY)
Admission: RE | Admit: 2022-02-15 | Discharge: 2022-02-15 | Disposition: A | Discharge status: Home / Self Care | Payer: Self-pay | Source: Ambulatory Visit | Attending: Primary Care | Admitting: Primary Care

## 2022-02-15 VITALS — BP 107/75 | HR 88 | Temp 98.1°F | Ht 63.0 in | Wt 203.4 lb

## 2022-02-15 DIAGNOSIS — Z113 Encounter for screening for infections with a predominantly sexual mode of transmission: Secondary | ICD-10-CM | POA: Insufficient documentation

## 2022-02-15 DIAGNOSIS — Z124 Encounter for screening for malignant neoplasm of cervix: Secondary | ICD-10-CM | POA: Insufficient documentation

## 2022-02-15 DIAGNOSIS — F418 Other specified anxiety disorders: Secondary | ICD-10-CM

## 2022-02-15 NOTE — Progress Notes (Signed)
  [facility] Patient name: Wanda Crane MRN [id number]  Date of birth: [birth year] Chief Complaint:   Gynecologic Exam  History of Present Illness:   Wanda Crane is a 31 y.o. G0P0000 female being seen today for a routine well-woman exam.   CC: gyn visit   The current method of family planning is none.  No LMP recorded. (Menstrual status: Irregular Periods). Last pap 09/19/18. Last mammogram: N/A. Results were: [facility] Family h/o breast cancer: No Last colonoscopy: N/A. Family h/o colorectal cancer: No  Review of Systems:    Denies any headaches, blurred vision, fatigue, shortness of breath, chest pain, abdominal pain, abnormal vaginal discharge/itching/odor/irritation, problems with periods, bowel movements, urination, or intercourse unless otherwise stated above.  Pertinent History Reviewed:   Reviewed past medical,surgical, social and family history.  Reviewed problem list, medications and allergies.  Physical Assessment:   Vitals:   02/15/22 1537  BP: 107/75  Pulse: 88  Temp: 98.1 F (36.7 C)  TempSrc: Oral  SpO2: 95%  Weight: 203 lb 6.4 oz (92.3 kg)  Height: '5\' 3"'$  (1.6 m)  Body mass index is 36.03 kg/m.        Physical Examination:  General appearance - well appearing, and in no distress Mental status - alert, oriented to person, place, and time Psych:  She has a normal mood and affect Skin - warm and dry, normal color, no suspicious lesions noted Chest - effort normal, all lung fields clear to auscultation bilaterally Heart - normal rate and regular rhythm Neck:  midline trachea, no thyromegaly or nodules Breasts - breasts appear normal, no suspicious masses, no skin or nipple changes or axillary nodes Educated patient on proper self breast examination and had patient to demonstrate SBE. Abdomen - soft, nontender, nondistended, no masses or organomegaly Pelvic-VULVA: normal appearing vulva with no masses, tenderness or  lesions   VAGINA: normal appearing vagina with normal color and discharge, no lesions   CERVIX: normal appearing cervix without discharge or lesions, no CMT UTERUS: uterus is felt to be normal size, shape, consistency and nontender  ADNEXA: No adnexal masses or tenderness noted. Extremities:  No swelling or varicosities noted  No results found for this or any previous visit (from the past 24 hour(s)).   Assessment & Plan:  Wanda Crane was seen today for gynecologic exam.  Diagnoses and all orders for this visit:  Cervical cancer screening -     Cytology - PAP([place])  Screen for STD (sexually transmitted disease) -     Cervicovaginal ancillary only  Depression with anxiety Flowsheet Row Office Visit from 02/15/2022 in [place]  PHQ-9 Total Score 24      Followed by therapist     Follow-up: Return for annual physical.  This note has been created with Surveyor, quantity. Any transcriptional errors are unintentional.   Kerin Perna, NP 02/15/2022, 4:56 PM

## 2022-02-20 LAB — CERVICOVAGINAL ANCILLARY ONLY
Bacterial Vaginitis (gardnerella): POSITIVE — AB
Candida Glabrata: NEGATIVE
Candida Vaginitis: POSITIVE — AB
Chlamydia: NEGATIVE
Comment: NEGATIVE
Comment: NEGATIVE
Comment: NEGATIVE
Comment: NEGATIVE
Comment: NEGATIVE
Comment: NORMAL
Neisseria Gonorrhea: NEGATIVE
Trichomonas: NEGATIVE

## 2022-02-23 ENCOUNTER — Other Ambulatory Visit (INDEPENDENT_AMBULATORY_CARE_PROVIDER_SITE_OTHER): Payer: Self-pay | Admitting: Primary Care

## 2022-02-23 ENCOUNTER — Other Ambulatory Visit: Payer: Self-pay

## 2022-02-23 DIAGNOSIS — B379 Candidiasis, unspecified: Secondary | ICD-10-CM

## 2022-02-23 DIAGNOSIS — B9689 Other specified bacterial agents as the cause of diseases classified elsewhere: Secondary | ICD-10-CM

## 2022-02-23 MED ORDER — METRONIDAZOLE 500 MG PO TABS
500.0000 mg | ORAL_TABLET | Freq: Two times a day (BID) | ORAL | 0 refills | Status: AC
  Filled 2022-02-23: qty 14, 7d supply, fill #0

## 2022-02-23 MED ORDER — FLUCONAZOLE 150 MG PO TABS
150.0000 mg | ORAL_TABLET | Freq: Every day | ORAL | 1 refills | Status: AC
  Filled 2022-02-23: qty 1, 1d supply, fill #0

## 2022-02-24 LAB — CYTOLOGY - PAP
Comment: NEGATIVE
Comment: NEGATIVE
Comment: NEGATIVE
Diagnosis: NEGATIVE
HPV 16: NEGATIVE
HPV 18 / 45: NEGATIVE
High risk HPV: POSITIVE — AB

## 2022-03-02 ENCOUNTER — Other Ambulatory Visit: Payer: Self-pay

## 2022-03-10 ENCOUNTER — Ambulatory Visit (INDEPENDENT_AMBULATORY_CARE_PROVIDER_SITE_OTHER): Payer: Self-pay | Admitting: Orthopaedic Surgery

## 2022-03-10 DIAGNOSIS — M4317 Spondylolisthesis, lumbosacral region: Secondary | ICD-10-CM

## 2022-03-10 NOTE — Progress Notes (Signed)
Chief Complaint: left foot pain     History of Present Illness:  03/10/2022: Here today with continued symptoms shooting down the left leg.  She has not been able to establish follow-up with spine surgeon.  She was previously seen for an injection into her epidural space without significant relief.  Wanda Crane is a 31 y.o. female with known history of L5 on S1 spondylolisthesis presents with pain rating down the lateral aspect of the left foot.  She has previously been seen for her back as she has a radiating pain down the right side.  This has been going on for several years.  She has had a history of injections in the past which do help her.  She has done physical therapy in the past which she states aggravates her pain.  She is currently taking gabapentin.  This dose was recently increased which does help with the radiating type pain.  She is not working at this time.    Surgical History:   None  PMH/PSH/Family History/Social History/Meds/Allergies:    Past Medical History:  Diagnosis Date   Chronic right-sided low back pain with right-sided sciatica 04/13/2017   Hemorrhagic cyst of ovary    Spondylolisthesis, grade 1 11/27/2016   No past surgical history on file. Social History   Socioeconomic History   Marital status: Single    Spouse name: Not on file   Number of children: Not on file   Years of education: Not on file   Highest education level: Not on file  Occupational History   Not on file  Tobacco Use   Smoking status: Never   Smokeless tobacco: Never  Vaping Use   Vaping Use: Never used  Substance and Sexual Activity   Alcohol use: No   Drug use: No   Sexual activity: Yes    Birth control/protection: None  Other Topics Concern   Not on file  Social History Narrative   Not on file   Social Determinants of Health   Financial Resource Strain: Not on file  Food Insecurity: Not on file  Transportation Needs: Not on file   Physical Activity: Not on file  Stress: Not on file  Social Connections: Not on file   Family History  Problem Relation Age of Onset   Asthma Mother    COPD Mother    Diabetes Maternal Grandmother    Heart disease Maternal Grandmother    Stroke Maternal Grandmother    Diabetes Maternal Grandfather    Heart disease Maternal Grandfather    Stroke Maternal Grandfather    No Known Allergies Current Outpatient Medications  Medication Sig Dispense Refill   diazepam (VALIUM) 10 MG tablet Take one tablet po 30 min prior to procedure. Must have a driver. 1 tablet 0   DULoxetine (CYMBALTA) 60 MG capsule Take 1 capsule (60 mg total) by mouth daily. 30 capsule 2   fluconazole (DIFLUCAN) 150 MG tablet Take 1 tablet (150 mg total) by mouth daily. 1 tablet 1   gabapentin (NEURONTIN) 100 MG capsule Take 1 capsule (100 mg total) by mouth 2 (two) times daily. 90 capsule 2   gabapentin (NEURONTIN) 100 MG capsule Take 2 capsules (200 mg total) by mouth at bedtime. (Patient not taking: Reported on 10/06/2021) 60 capsule 2   metroNIDAZOLE (FLAGYL) 500 MG tablet Take 1  tablet (500 mg total) by mouth 2 (two) times daily. 14 tablet 0   traZODone (DESYREL) 100 MG tablet Take 1 tablet (100 mg total) by mouth at bedtime. 30 tablet 2   No current facility-administered medications for this visit.   No results found.  Review of Systems:   A ROS was performed including pertinent positives and negatives as documented in the HPI.  Physical Exam :   Constitutional: NAD and appears stated age Neurological: Alert and oriented Psych: Appropriate affect and cooperative There were no vitals taken for this visit.   Comprehensive Musculoskeletal Exam:    Radicular pain down the left lateral ankle into the fourth and fifth metatarsal.  No pain with palpation about the metatarsals.  No pain with palpation of the ankle.  She has full strength and range of motion of bilateral lower extremities.  2+ dorsalis  pedis.  Imaging:   Xray (3 views left foot): Normal  MRI (lumbar spine): Significant L5 on S1 spondylolisthesis with bilateral S1 nerve root compression  I personally reviewed and interpreted the radiographs.   Assessment:   31 year old female with bilateral S1 compression from dynamic spondylolisthesis.  At this time I would like her to have a referral to a spine surgeon as I do believe his symptoms are emanating from her spondylolisthesis.  I do believe that she might be a candidate for correction of this.  I will plan to see her back on an as-needed basis. Plan :    -Return to clinic as needed    I personally saw and evaluated the patient, and participated in the management and treatment plan.  Vanetta Mulders, MD Attending Physician, Orthopedic Surgery  This document was dictated using Dragon voice recognition software. A reasonable attempt at proof reading has been made to minimize errors.

## 2022-03-21 ENCOUNTER — Ambulatory Visit
Admission: EM | Admit: 2022-03-21 | Discharge: 2022-03-21 | Disposition: A | Discharge status: Home / Self Care | Payer: No Typology Code available for payment source | Attending: Family Medicine | Admitting: Family Medicine

## 2022-03-21 ENCOUNTER — Encounter: Payer: Self-pay | Admitting: Emergency Medicine

## 2022-03-21 ENCOUNTER — Ambulatory Visit (INDEPENDENT_AMBULATORY_CARE_PROVIDER_SITE_OTHER): Payer: Self-pay

## 2022-03-21 DIAGNOSIS — S6791XA Crushing injury of unspecified part(s) of right wrist, hand and fingers, initial encounter: Secondary | ICD-10-CM

## 2022-03-21 DIAGNOSIS — S60221A Contusion of right hand, initial encounter: Secondary | ICD-10-CM

## 2022-03-21 DIAGNOSIS — Z3202 Encounter for pregnancy test, result negative: Secondary | ICD-10-CM

## 2022-03-21 DIAGNOSIS — G5691 Unspecified mononeuropathy of right upper limb: Secondary | ICD-10-CM

## 2022-03-21 LAB — POCT URINE PREGNANCY: Preg Test, Ur: NEGATIVE

## 2022-03-21 MED ORDER — HYDROCODONE-ACETAMINOPHEN 5-325 MG PO TABS: 1.0000 | ORAL_TABLET | Freq: Four times a day (QID) | ORAL | 0 refills | Status: AC | PRN

## 2022-03-21 MED ORDER — ONDANSETRON 4 MG PO TBDP: 4.0000 mg | ORAL_TABLET | Freq: Three times a day (TID) | ORAL | 0 refills | Status: AC | PRN

## 2022-03-21 MED ORDER — HYDROCODONE-ACETAMINOPHEN 5-325 MG PO TABS: 1.0000 | ORAL_TABLET | Freq: Four times a day (QID) | ORAL | 0 refills | Status: DC | PRN

## 2022-03-21 MED ORDER — PREDNISONE 20 MG PO TABS: ORAL_TABLET | ORAL | 0 refills | Status: AC

## 2022-03-21 NOTE — ED Provider Notes (Signed)
[name]    CSN: [id number] Arrival date & time: 03/21/22  1028      History   Chief Complaint Chief Complaint  Patient presents with  . Hand Pain    HPI Wanda Crane is a 31 y.o. female.    Hand Pain  Past Medical History:  Diagnosis Date  . Chronic right-sided low back pain with right-sided sciatica 04/13/2017  . Hemorrhagic cyst of ovary   . Spondylolisthesis, grade 1 11/27/2016    Patient Active Problem List   Diagnosis Date Noted  . Generalized anxiety disorder 06/23/2021  . MDD (major depressive disorder), recurrent episode, moderate ([place]) 04/20/2021  . Chronic right-sided low back pain with right-sided sciatica 04/13/2017  . Spondylolisthesis, grade 1 11/27/2016    History reviewed. No pertinent surgical history.  OB History     Gravida  0   Para  0   Term  0   Preterm  0   AB  0   Living  0      SAB  0   IAB  0   Ectopic  0   Multiple  0   Live Births  0            Home Medications    Prior to Admission medications   Medication Sig Start Date End Date Taking? Authorizing Provider  ondansetron (ZOFRAN-ODT) 4 MG disintegrating tablet Take 1 tablet (4 mg total) by mouth every 8 (eight) hours as needed for nausea or vomiting. Dissolve under tongue. 03/21/22  Yes Kandra Nicolas, MD  predniSONE (DELTASONE) 20 MG tablet Take one tab by mouth twice daily for 4 days, then one daily. Take with food. 03/21/22  Yes Kandra Nicolas, MD  diazepam (VALIUM) 10 MG tablet Take one tablet po 30 min prior to procedure. Must have a driver. 11/23/21   Kirsteins, Luanna Salk, MD  DULoxetine (CYMBALTA) 60 MG capsule Take 1 capsule (60 mg total) by mouth daily. 07/28/21   Armando Reichert, MD  fluconazole (DIFLUCAN) 150 MG tablet Take 1 tablet (150 mg total) by mouth daily. 02/23/22   Kerin Perna, NP  gabapentin (NEURONTIN) 100 MG capsule Take 1 capsule (100 mg total) by mouth 2 (two) times daily. 09/22/21   Armando Reichert, MD  gabapentin  (NEURONTIN) 100 MG capsule Take 2 capsules (200 mg total) by mouth at bedtime. Patient not taking: Reported on 10/06/2021 09/22/21 10/26/21  Armando Reichert, MD  HYDROcodone-acetaminophen (NORCO/VICODIN) 5-325 MG tablet Take 1 tablet by mouth every 6 (six) hours as needed for up to 5 days for moderate pain or severe pain. 03/21/22 03/26/22  Kandra Nicolas, MD  metroNIDAZOLE (FLAGYL) 500 MG tablet Take 1 tablet (500 mg total) by mouth 2 (two) times daily. 02/23/22   Kerin Perna, NP  traZODone (DESYREL) 100 MG tablet Take 1 tablet (100 mg total) by mouth at bedtime. 07/28/21   Armando Reichert, MD    Family History Family History  Problem Relation Age of Onset  . Asthma Mother   . COPD Mother   . Diabetes Maternal Grandmother   . Heart disease Maternal Grandmother   . Stroke Maternal Grandmother   . Diabetes Maternal Grandfather   . Heart disease Maternal Grandfather   . Stroke Maternal Grandfather     Social History Social History   Tobacco Use  . Smoking status: Never  . Smokeless tobacco: Never  Vaping Use  . Vaping Use: Never used  Substance Use Topics  . Alcohol use: No  .  Drug use: No     Allergies   Patient has no known allergies.   Review of Systems Review of Systems   Physical Exam Triage Vital Signs ED Triage Vitals [03/21/22 1144]  Enc Vitals Group     BP 121/86     Pulse Rate 77     Resp 18     Temp 98.4 F (36.9 C)     Temp Source Oral     SpO2 99 %     Weight 205 lb (93 kg)     Height '5\' 2"'$  (1.575 m)     Head Circumference      Peak Flow      Pain Score 10     Pain Loc      Pain Edu?      Excl. in [place]?    No data found.  Updated Vital Signs BP 121/86 (BP Location: Left Arm)   Pulse 77   Temp 98.4 F (36.9 C) (Oral)   Resp 18   Ht '5\' 2"'$  (1.575 m)   Wt 93 kg   SpO2 99%   BMI 37.49 kg/m   Visual Acuity Right Eye Distance:   Left Eye Distance:   Bilateral Distance:    Right Eye Near:   Left Eye Near:    Bilateral Near:      Physical Exam Musculoskeletal:       Hands:    UC Treatments / Results  Labs (all labs ordered are listed, but only abnormal results are displayed) Labs Reviewed  POCT URINE PREGNANCY    EKG   Radiology DG Hand Complete Right  Result Date: 03/21/2022 CLINICAL DATA:  Crush injury in a door 3 days ago.  Right hand pain. EXAM: RIGHT HAND - COMPLETE 3+ VIEW COMPARISON:  None Available. FINDINGS: There is no evidence of fracture or dislocation. There is no evidence of arthropathy or other focal bone abnormality. Soft tissues are unremarkable. IMPRESSION: Negative. Electronically Signed   By: Misty Stanley M.D.   On: 03/21/2022 12:10    Procedures Procedures (including critical care time)  Medications Ordered in UC Medications - No data to display  Initial Impression / Assessment and Plan / UC Course  I have reviewed the triage vital signs and the nursing notes.  Pertinent labs & imaging results that were available during my care of the patient were reviewed by me and considered in my medical decision making (see chart for details).    Splint and ace wrap applied.  Begin prednisone burst/taper.  Rx for Zofran ODT (patient's request: nausea with prednisone). Rx for Vicodin (#10, no refill). Controlled Substance Prescriptions I have consulted the [place] Controlled Substances Registry for this patient, and feel the risk/benefit ratio today is favorable for proceeding with this prescription for a controlled substance.  Followup with Dr. Aundria Mems ([facility]) if not improving about one week.  Final Clinical Impressions(s) / UC Diagnoses   Final diagnoses:  Contusion of right hand, initial encounter  Neuropathy of finger of right hand     Discharge Instructions      Wear splint and ace wrap until swelling improves.  Continue until pain and swelling decrease. Apply ice pack for about 10 minutes, 3 to 4 times daily. When pain and swelling improve, begin  finger range of motion exercises.      ED Prescriptions     Medication Sig Dispense Auth. Provider   predniSONE (DELTASONE) 20 MG tablet Take one tab by mouth twice daily for  4 days, then one daily. Take with food. 12 tablet Kandra Nicolas, MD   ondansetron (ZOFRAN-ODT) 4 MG disintegrating tablet Take 1 tablet (4 mg total) by mouth every 8 (eight) hours as needed for nausea or vomiting. Dissolve under tongue. 12 tablet Kandra Nicolas, MD   HYDROcodone-acetaminophen (NORCO/VICODIN) 5-325 MG tablet  (Status: Discontinued) Take 1 tablet by mouth every 6 (six) hours as needed for up to 5 days for moderate pain or severe pain. 10 tablet Kandra Nicolas, MD   HYDROcodone-acetaminophen (NORCO/VICODIN) 5-325 MG tablet Take 1 tablet by mouth every 6 (six) hours as needed for up to 5 days for moderate pain or severe pain. 10 tablet Kandra Nicolas, MD      I have reviewed the PDMP during this encounter.

## 2022-03-21 NOTE — Discharge Instructions (Signed)
Wear splint and ace wrap until swelling improves.  Continue until pain and swelling decrease. Apply ice pack for about 10 minutes, 3 to 4 times daily. When pain and swelling improve, begin finger range of motion exercises.

## 2022-03-21 NOTE — ED Triage Notes (Signed)
Patient states that she smashed her right hand in a door on Saturday.  The hand is somewhat swollen and denies any OTC pain meds.

## 2022-04-12 ENCOUNTER — Ambulatory Visit (INDEPENDENT_AMBULATORY_CARE_PROVIDER_SITE_OTHER): Payer: Self-pay

## 2022-04-12 ENCOUNTER — Ambulatory Visit
Admission: EM | Admit: 2022-04-12 | Discharge: 2022-04-12 | Disposition: A | Discharge status: Home / Self Care | Payer: Self-pay | Attending: Family Medicine | Admitting: Family Medicine

## 2022-04-12 DIAGNOSIS — M25441 Effusion, right hand: Secondary | ICD-10-CM

## 2022-04-12 DIAGNOSIS — S6991XA Unspecified injury of right wrist, hand and finger(s), initial encounter: Secondary | ICD-10-CM

## 2022-04-12 DIAGNOSIS — G5691 Unspecified mononeuropathy of right upper limb: Secondary | ICD-10-CM

## 2022-04-12 DIAGNOSIS — S60221D Contusion of right hand, subsequent encounter: Secondary | ICD-10-CM

## 2022-04-12 NOTE — ED Triage Notes (Signed)
Pt presents to Urgent Care with c/o worsening R hand pain since yesterday. Swelling noted to proximal R 5th finger. Reports being seen here approx one month ago for problem w/ this hand. No known injury besides "playing w/ a 31-yr-old."

## 2022-04-12 NOTE — ED Provider Notes (Signed)
[facility]    CSN: [id number] Arrival date & time: 04/12/22  1825      History   Chief Complaint Chief Complaint  Patient presents with   Hand Pain    HPI Wanda Crane is a 31 y.o. female.   Patient was here 3+ weeks ago for injury to her right hand.  She had slight improvement, but yesterday her child fell on her hand while they were wrestling.  She has recurrent pain/swelling of her right hand and fifth finger. She was advised during her previous visit to follow-up with sports medicine if she did not improved but she states that she was unable to get an appointment.  The history is provided by the patient.  Hand Pain This is a recurrent problem. The current episode started yesterday. The problem occurs constantly. The problem has not changed since onset.Exacerbated by: movement of 5th finger. Nothing relieves the symptoms. She has tried nothing for the symptoms.    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 No pertinent surgical history.  OB History     Gravida  0   Para  0   Term  0   Preterm  0   AB  0   Living  0      SAB  0   IAB  0   Ectopic  0   Multiple  0   Live Births  0            Home Medications    Prior to Admission medications   Medication Sig Start Date End Date Taking? Authorizing Provider  diazepam (VALIUM) 10 MG tablet Take one tablet po 30 min prior to procedure. Must have a driver. 11/23/21   Kirsteins, Luanna Salk, MD  DULoxetine (CYMBALTA) 60 MG capsule Take 1 capsule  (60 mg total) by mouth daily. 07/28/21   Armando Reichert, MD  fluconazole (DIFLUCAN) 150 MG tablet Take 1 tablet (150 mg total) by mouth daily. 02/23/22   Kerin Perna, NP  gabapentin (NEURONTIN) 100 MG capsule Take 1 capsule (100 mg total) by mouth 2 (two) times daily. 09/22/21   Armando Reichert, MD  gabapentin (NEURONTIN) 100 MG capsule Take 2 capsules (200 mg total) by mouth at bedtime. Patient not taking: Reported on 10/06/2021 09/22/21 10/26/21  Armando Reichert, MD  metroNIDAZOLE (FLAGYL) 500 MG tablet Take 1 tablet (500 mg total) by mouth 2 (two) times daily. 02/23/22   Kerin Perna, NP  ondansetron (ZOFRAN-ODT) 4 MG disintegrating tablet Take 1 tablet (4 mg total) by mouth every 8 (eight) hours as needed for nausea or vomiting. Dissolve under tongue. 03/21/22   Kandra Nicolas, MD  predniSONE (DELTASONE) 20 MG tablet Take one tab by mouth twice daily for 4 days, then one daily. Take with food. 03/21/22   Kandra Nicolas, MD  traZODone (DESYREL) 100 MG tablet Take 1 tablet (100 mg total) by mouth at bedtime. 07/28/21   Armando Reichert, MD    Family History Family History  Problem Relation Age of Onset   Asthma Mother  COPD Mother    Stroke Father    Diabetes Maternal Grandmother    Heart disease Maternal Grandmother    Stroke Maternal Grandmother    Diabetes Maternal Grandfather    Heart disease Maternal Grandfather    Stroke Maternal Grandfather     Social History Social History   Tobacco Use   Smoking status: Never   Smokeless tobacco: Never  Vaping Use   Vaping Use: Never used  Substance Use Topics   Alcohol use: No   Drug use: No     Allergies   Patient has no known allergies.   Review of Systems Review of Systems  Constitutional: Negative.   HENT: Negative.    Eyes: Negative.   Respiratory: Negative.    Gastrointestinal: Negative.   Genitourinary: Negative.   Musculoskeletal:  Positive for joint swelling.       Right hand pain.  Skin:  Negative for  color change and wound.  Neurological:        Numbness in right fifth finger.  Hematological:  Negative for adenopathy.  All other systems reviewed and are negative.    Physical Exam Triage Vital Signs ED Triage Vitals  Enc Vitals Group     BP 04/12/22 1846 108/75     Pulse Rate 04/12/22 1846 95     Resp 04/12/22 1846 20     Temp 04/12/22 1846 98.3 F (36.8 C)     Temp Source 04/12/22 1846 Oral     SpO2 04/12/22 1846 98 %     Weight 04/12/22 1841 205 lb (93 kg)     Height 04/12/22 1841 '5\' 3"'$  (1.6 m)     Head Circumference --      Peak Flow --      Pain Score 04/12/22 1841 9     Pain Loc --      Pain Edu? --      Excl. in [place]? --    No data found.  Updated Vital Signs BP 108/75 (BP Location: Left Arm)   Pulse 95   Temp 98.3 F (36.8 C) (Oral)   Resp 20   Ht '5\' 3"'$  (1.6 m)   Wt 93 kg   LMP 03/14/2022 (Approximate)   SpO2 98%   BMI 36.31 kg/m   Visual Acuity Right Eye Distance:   Left Eye Distance:   Bilateral Distance:    Right Eye Near:   Left Eye Near:    Bilateral Near:     Physical Exam Vitals and nursing note reviewed.  Constitutional:      General: She is not in acute distress. HENT:     Head: Normocephalic.  Eyes:     Pupils: Pupils are equal, round, and reactive to light.  Cardiovascular:     Rate and Rhythm: Normal rate.  Pulmonary:     Effort: Pulmonary effort is normal.  Musculoskeletal:     Right hand: Swelling present. Decreased range of motion. Decreased strength. Decreased sensation.       Hands:     Comments: Right hand has mild swelling/tenderness over the 5th MCP joint.  Decreased range of motion of 5th MCP joint.  She has decreased sensation over the fifth finger and tenderness to palpation   Skin:    General: Skin is warm and dry.  Neurological:     Mental Status: She is alert.      UC Treatments / Results  Labs (all labs ordered are listed, but only abnormal results are displayed) Labs Reviewed - No  data to  display  EKG   Radiology DG Hand Complete Right  Result Date: 04/12/2022 CLINICAL DATA:  Re-injured right hand yesterday. Pain/swelling over 5th MCP joint. 31 year-old female injured her Right hand yesterday playing with her kid. C/o pain to 5th MCP joint and little finger. EXAM: RIGHT HAND - COMPLETE 3+ VIEW COMPARISON:  X-ray right hand 03/21/2022 FINDINGS: There is no evidence of fracture or dislocation. There is no evidence of arthropathy or other focal bone abnormality. Soft tissues are unremarkable. IMPRESSION: No acute displaced fracture or dislocation. Electronically Signed   By: Iven Finn M.D.   On: 04/12/2022 19:45    Procedures Procedures (including critical care time)  Medications Ordered in UC Medications - No data to display  Initial Impression / Assessment and Plan / UC Course  I have reviewed the triage vital signs and the nursing notes.  Pertinent labs & imaging results that were available during my care of the patient were reviewed by me and considered in my medical decision making (see chart for details).    Finger/hand re-injured. Finger buddy-taped. Referral to hand surgeon for evaluation.  Final Clinical Impressions(s) / UC Diagnoses   Final diagnoses:  Contusion of right hand, subsequent encounter  Neuropathy of finger of right hand     Discharge Instructions      Apply ice pack for 10 to 15 minutes, 3 to 4 times daily  Continue until pain and swelling decrease.  Buddy tape fingers until swelling resolves.  May take Tylenol as needed for pain.     ED Prescriptions   None       Kandra Nicolas, MD 04/14/22 1248

## 2022-04-12 NOTE — Discharge Instructions (Addendum)
Apply ice pack for 10 to 15 minutes, 3 to 4 times daily  Continue until pain and swelling decrease.  Buddy tape fingers until swelling resolves.  May take Tylenol as needed for pain.

## 2022-04-13 ENCOUNTER — Telehealth: Payer: Self-pay | Admitting: Family Medicine

## 2022-04-13 ENCOUNTER — Telehealth: Payer: Self-pay | Admitting: Emergency Medicine

## 2022-04-13 MED ORDER — GABAPENTIN 300 MG PO CAPS: 300.0000 mg | ORAL_CAPSULE | Freq: Every day | ORAL | 1 refills | Status: AC

## 2022-04-13 NOTE — Telephone Encounter (Signed)
Spoke with patient and advised that Dr Assunta Found will send in a prescription for Gabapentin '300mg'$ .  Verified pharmacy with patient.  Patient voices understanding.

## 2022-04-13 NOTE — Telephone Encounter (Signed)
Patient called stating that her right hand is no better.  Patient has taken Tylenol and Aleve w/o relief.  Patient states that she was advised to call back today if things did not improve.  Please advise.

## 2022-04-13 NOTE — Telephone Encounter (Signed)
Patient telephone call:  persistent pain in hand/finger. PLAN: Rx for gabapentin '300mg'$  HS.

## 2022-05-02 ENCOUNTER — Telehealth: Payer: Self-pay | Admitting: *Deleted

## 2022-05-02 NOTE — Telephone Encounter (Signed)
Ms Hagadorn called and is asking if Dr Letta Pate will fill gabapentin for her.

## 2022-05-03 NOTE — Telephone Encounter (Signed)
Notified to have another physician Rx since she has not been seen since April.

## 2022-06-01 ENCOUNTER — Ambulatory Visit (INDEPENDENT_AMBULATORY_CARE_PROVIDER_SITE_OTHER): Payer: Self-pay | Admitting: Primary Care

## 2022-06-20 ENCOUNTER — Encounter (HOSPITAL_COMMUNITY): Payer: No Payment, Other | Admitting: Psychiatry

## 2022-07-25 ENCOUNTER — Other Ambulatory Visit: Payer: Self-pay
